# Patient Record
Sex: Female | Born: 1935 | Race: White | Hispanic: No | Smoking: Never smoker
Health system: Southern US, Community
[De-identification: ages and names within clinical notes are randomized; demographics above are authoritative.]

## PROBLEM LIST (undated history)

## (undated) DIAGNOSIS — Z87442 Personal history of urinary calculi: Secondary | ICD-10-CM

## (undated) DIAGNOSIS — R112 Nausea with vomiting, unspecified: Secondary | ICD-10-CM

## (undated) DIAGNOSIS — C801 Malignant (primary) neoplasm, unspecified: Secondary | ICD-10-CM

## (undated) DIAGNOSIS — I10 Essential (primary) hypertension: Secondary | ICD-10-CM

## (undated) DIAGNOSIS — Z9889 Other specified postprocedural states: Secondary | ICD-10-CM

## (undated) DIAGNOSIS — T8859XA Other complications of anesthesia, initial encounter: Secondary | ICD-10-CM

## (undated) DIAGNOSIS — M1990 Unspecified osteoarthritis, unspecified site: Secondary | ICD-10-CM

## (undated) HISTORY — PX: EYE SURGERY: SHX253

## (undated) HISTORY — PX: JOINT REPLACEMENT: SHX530

---

## 2001-08-31 ENCOUNTER — Other Ambulatory Visit: Admission: RE | Admit: 2001-08-31 | Discharge: 2001-08-31 | Payer: Self-pay | Admitting: Internal Medicine

## 2003-02-05 ENCOUNTER — Other Ambulatory Visit: Admission: RE | Admit: 2003-02-05 | Discharge: 2003-02-05 | Payer: Self-pay | Admitting: Internal Medicine

## 2004-01-28 ENCOUNTER — Ambulatory Visit: Payer: Self-pay | Admitting: Internal Medicine

## 2004-02-04 ENCOUNTER — Ambulatory Visit: Payer: Self-pay | Admitting: Internal Medicine

## 2004-06-15 ENCOUNTER — Ambulatory Visit: Payer: Self-pay | Admitting: Internal Medicine

## 2004-10-06 ENCOUNTER — Ambulatory Visit: Payer: Self-pay | Admitting: Internal Medicine

## 2004-12-23 ENCOUNTER — Ambulatory Visit: Payer: Self-pay | Admitting: Internal Medicine

## 2004-12-29 ENCOUNTER — Inpatient Hospital Stay (HOSPITAL_COMMUNITY): Admission: RE | Admit: 2004-12-29 | Discharge: 2005-01-01 | Payer: Self-pay | Admitting: Orthopedic Surgery

## 2005-01-27 ENCOUNTER — Ambulatory Visit: Payer: Self-pay | Admitting: Internal Medicine

## 2005-02-09 ENCOUNTER — Ambulatory Visit: Payer: Self-pay | Admitting: Internal Medicine

## 2007-08-27 ENCOUNTER — Emergency Department (HOSPITAL_COMMUNITY): Admission: EM | Admit: 2007-08-27 | Discharge: 2007-08-27 | Payer: Self-pay | Admitting: Family Medicine

## 2008-12-12 ENCOUNTER — Encounter: Payer: Self-pay | Admitting: Internal Medicine

## 2009-06-11 ENCOUNTER — Ambulatory Visit: Payer: Self-pay | Admitting: Internal Medicine

## 2009-06-11 DIAGNOSIS — M25519 Pain in unspecified shoulder: Secondary | ICD-10-CM | POA: Insufficient documentation

## 2009-06-11 DIAGNOSIS — R0789 Other chest pain: Secondary | ICD-10-CM | POA: Insufficient documentation

## 2009-06-11 DIAGNOSIS — G43909 Migraine, unspecified, not intractable, without status migrainosus: Secondary | ICD-10-CM | POA: Insufficient documentation

## 2009-06-11 DIAGNOSIS — I10 Essential (primary) hypertension: Secondary | ICD-10-CM | POA: Insufficient documentation

## 2009-06-11 DIAGNOSIS — M129 Arthropathy, unspecified: Secondary | ICD-10-CM | POA: Insufficient documentation

## 2009-06-11 DIAGNOSIS — E785 Hyperlipidemia, unspecified: Secondary | ICD-10-CM | POA: Insufficient documentation

## 2009-06-13 DIAGNOSIS — R51 Headache: Secondary | ICD-10-CM | POA: Insufficient documentation

## 2009-06-13 DIAGNOSIS — M1990 Unspecified osteoarthritis, unspecified site: Secondary | ICD-10-CM | POA: Insufficient documentation

## 2009-06-13 DIAGNOSIS — R519 Headache, unspecified: Secondary | ICD-10-CM | POA: Insufficient documentation

## 2009-06-13 DIAGNOSIS — Z87442 Personal history of urinary calculi: Secondary | ICD-10-CM | POA: Insufficient documentation

## 2009-07-03 ENCOUNTER — Ambulatory Visit: Payer: Self-pay | Admitting: Internal Medicine

## 2009-07-03 LAB — CONVERTED CEMR LAB
ALT: 14 units/L (ref 0–35)
AST: 23 units/L (ref 0–37)
Albumin: 4 g/dL (ref 3.5–5.2)
Alkaline Phosphatase: 54 units/L (ref 39–117)
BUN: 33 mg/dL — ABNORMAL HIGH (ref 6–23)
Bilirubin, Direct: 0.1 mg/dL (ref 0.0–0.3)
CO2: 28 meq/L (ref 19–32)
Calcium: 9.3 mg/dL (ref 8.4–10.5)
Chloride: 105 meq/L (ref 96–112)
Cholesterol: 329 mg/dL — ABNORMAL HIGH (ref 0–200)
Creatinine, Ser: 0.9 mg/dL (ref 0.4–1.2)
Direct LDL: 238.1 mg/dL
GFR calc non Af Amer: 62.19 mL/min (ref >60)
Glucose, Bld: 96 mg/dL (ref 70–99)
HDL: 52.7 mg/dL (ref >39.00)
Potassium: 5 meq/L (ref 3.5–5.1)
Sodium: 143 meq/L (ref 135–145)
TSH: 2.19 microintl units/mL (ref 0.35–5.50)
Total Bilirubin: 0.8 mg/dL (ref 0.3–1.2)
Total CHOL/HDL Ratio: 6
Total Protein: 6.9 g/dL (ref 6.0–8.3)
Triglycerides: 159 mg/dL — ABNORMAL HIGH (ref 0.0–149.0)
VLDL: 31.8 mg/dL (ref 0.0–40.0)

## 2009-07-10 ENCOUNTER — Ambulatory Visit: Payer: Self-pay | Admitting: Internal Medicine

## 2009-08-15 ENCOUNTER — Ambulatory Visit: Payer: Self-pay | Admitting: Internal Medicine

## 2009-08-21 LAB — CONVERTED CEMR LAB
BUN: 33 mg/dL — ABNORMAL HIGH (ref 6–23)
CO2: 30 meq/L (ref 19–32)
Calcium: 9.5 mg/dL (ref 8.4–10.5)
Chloride: 106 meq/L (ref 96–112)
Cholesterol: 195 mg/dL (ref 0–200)
Creatinine, Ser: 1 mg/dL (ref 0.4–1.2)
GFR calc non Af Amer: 60.68 mL/min (ref >60)
Glucose, Bld: 106 mg/dL — ABNORMAL HIGH (ref 70–99)
HDL: 54.5 mg/dL (ref >39.00)
LDL Cholesterol: 120 mg/dL — ABNORMAL HIGH (ref 0–99)
Potassium: 4.4 meq/L (ref 3.5–5.1)
Sodium: 145 meq/L (ref 135–145)
Total CHOL/HDL Ratio: 4
Triglycerides: 104 mg/dL (ref 0.0–149.0)
VLDL: 20.8 mg/dL (ref 0.0–40.0)

## 2009-08-22 ENCOUNTER — Ambulatory Visit: Payer: Self-pay | Admitting: Internal Medicine

## 2009-08-22 DIAGNOSIS — R7309 Other abnormal glucose: Secondary | ICD-10-CM | POA: Insufficient documentation

## 2009-11-19 ENCOUNTER — Ambulatory Visit: Payer: Self-pay | Admitting: Internal Medicine

## 2009-12-22 ENCOUNTER — Ambulatory Visit: Payer: Self-pay | Admitting: Internal Medicine

## 2009-12-22 LAB — CONVERTED CEMR LAB
ALT: 16 units/L (ref 0–35)
AST: 27 units/L (ref 0–37)
Albumin: 3.8 g/dL (ref 3.5–5.2)
Alkaline Phosphatase: 53 units/L (ref 39–117)
Bilirubin, Direct: 0.1 mg/dL (ref 0.0–0.3)
Cholesterol: 216 mg/dL — ABNORMAL HIGH (ref 0–200)
Direct LDL: 146 mg/dL
HDL: 50.9 mg/dL (ref >39.00)
Hgb A1c MFr Bld: 6.5 % (ref 4.6–6.5)
Total Bilirubin: 1.1 mg/dL (ref 0.3–1.2)
Total CHOL/HDL Ratio: 4
Total Protein: 6.6 g/dL (ref 6.0–8.3)
Triglycerides: 113 mg/dL (ref 0.0–149.0)
VLDL: 22.6 mg/dL (ref 0.0–40.0)

## 2009-12-25 ENCOUNTER — Ambulatory Visit: Payer: Self-pay | Admitting: Internal Medicine

## 2010-03-22 LAB — CONVERTED CEMR LAB: Pap Smear: NORMAL

## 2010-03-24 NOTE — Assessment & Plan Note (Signed)
Summary: 1 mo rov/mm   Vital Signs:  Patient profile:   75 year old female Menstrual status:  postmenopausal Weight:      165 pounds BP sitting:   130 / 90  (right arm) CC: FU labs   History of Present Illness: [name] comes in today  with husband for follow up of labs  HT and lipids .  See past  hx of se of  simvastatin and stopped .  off for about 2-3 weeks.    "the only time better " was when  pure    vegetarian eating and was hungry all the time.  Willing to try again.    Preventive Screening-Counseling & Management  Alcohol-Tobacco     Alcohol drinks/day: 0     Smoking Status: never     Passive Smoke Exposure: no  Caffeine-Diet-Exercise     Caffeine use/day: 3     Does Patient Exercise: no  Current Medications (verified): 1)  Zestoretic 10-12.5 Mg Tabs (Lisinopril-Hydrochlorothiazide) .Marland Kitchen.. 1 By Mouth Once Daily  Allergies (verified): 1)  ! Sulfa 2)  ! * [place]ipero  Past History:  Past medical, surgical, family and social histories (including risk factors) reviewed for relevance to current acute and chronic problems.  Past Medical History: Reviewed history from 06/11/2009 and no changes required. G4P4 Hyperlipidemia Hypertension Osteoarthritis Varicella Nephrolithiasis, hx of Headache migraines when younger  Past Surgical History: Reviewed history from 06/11/2009 and no changes required. Knee Replacement-12/29/2004; 02/14/2007  Family History: Reviewed history from 06/11/2009 and no changes required. Father: DM, Stroke, alcholicdied early 22s  Mother: Heart problems, alcoholic  died early 31s  Siblings:  Daughter with DM   Social History: Reviewed history from 06/11/2009 and no changes required. Occupation: [employer] Married Never Smoked Alcohol use-no Drug use-no Regular exercise-no Hh of 3  3 dogs.   Physical Exam  General:  Well-developed,well-nourished,in no acute distress; alert,appropriate and cooperative  Psych:   Oriented X3, good eye contact, not anxious appearing, and not depressed appearing.   reviewed labs       Impression & Recommendations:  Problem # 1:  HYPERLIPIDEMIA (ICD-272.4) s/e of higher dose of meds  and simvastatin wasnt effective . had been on lipitor  in past and ? se on higher dose.  aware of lifestyle intervention .  The following medications were removed from the medication list:    Simvastatin 40 Mg Tabs (Simvastatin) .Marland Kitchen... 1 by mouth once daily Her updated medication list for this problem includes:    Crestor 10 Mg Tabs (Rosuvastatin calcium) .Marland Kitchen... 1 by mouth once daily  Labs Reviewed: SGOT: 23 (07/03/2009)   SGPT: 14 (07/03/2009)   HDL:52.70 (07/03/2009)  Chol:329 (07/03/2009)  Trig:159.0 (07/03/2009)  Problem # 2:  HYPERTENSION (ICD-401.9)  needs refill of meds  Her updated medication list for this problem includes:    Zestoretic 10-12.5 Mg Tabs (Lisinopril-hydrochlorothiazide) .Marland Kitchen... 1 by mouth once daily  BP today: 130/90 Prior BP: 130/80 (06/11/2009)  Labs Reviewed: K+: 5.0 (07/03/2009) Creat: : 0.9 (07/03/2009)   Chol: 329 (07/03/2009)   HDL: 52.70 (07/03/2009)   TG: 159.0 (07/03/2009)  Orders: Prescription Created Electronically [phone])  Complete Medication List: 1)  Zestoretic 10-12.5 Mg Tabs (Lisinopril-hydrochlorothiazide) .Marland Kitchen.. 1 by mouth once daily 2)  Crestor 10 Mg Tabs (Rosuvastatin calcium) .Marland Kitchen.. 1 by mouth once daily  Patient Instructions: 1)  take crestor every day.  2)  Hepatic Panel prior to visit ICD-9:  3)  Lipid panel prior to visit ICD-9 :  4)  ROV in 6-7 weeks or as needed. Prescriptions: ZESTORETIC 10-12.5 MG TABS (LISINOPRIL-HYDROCHLOROTHIAZIDE) 1 by mouth once daily  #90 x 3   Entered and Authorized by:   Madelin Headings MD   Signed by:   Madelin Headings MD on 07/10/2009   Method used:   Electronically to        [facility]  [address] (retail)       [address]       [place], [place]  [address]       Ph:  [phone]       Fax: [phone]   RxID:   [id number]

## 2010-03-24 NOTE — Assessment & Plan Note (Signed)
Summary: new to est- ok per shannon//ccm   Vital Signs:  Patient profile:   75 year old female Menstrual status:  postmenopausal Height:      61.75 inches Weight:      163 pounds BMI:     30.16 Pulse rate:   66 / minute BP sitting:   130 / 80  (right arm) Cuff size:   regular  Vitals Entered By: Romualdo Bolk, CMA (AAMA) (June 11, 2009 10:58 AM) CC: New Pt to get re-establish     Menstrual Status postmenopausal Last PAP Result normal   History of Present Illness: [name] comesin with husband  to reestablish  after moving back from [place] where they have been a number of years   . Sh has a number of medical issues   .   Brings most recent medical records.   She has HT on medication without se. SHe has been rx for hyperlipidemia   zocor recnelty and Went up on lipid med 2 months  ago. from ? 20 to 40 mg  and began to notice  a tight ness  in her chest like elephants sitting on her  at night that  can wake her up.   No exercise induce signs or limitation  .    no gerd  acid noted and no nausea vomiting  cough..  no exercise   indiced signs no asa. no coughing and wheezing.      Also has been dealing with  left shoulder nec arm pain   .  Has had therapy for left arm pain   for bursitis with  RX  with PT.  x 2 months  3 x per week   muscle spasms.  and still bothering her        cortisone shot no helps.   hurts when moves at times.    harder to raise arm . NO numbness or weakness.   Preventive Care Screening  Mammogram:    Date:  03/25/2009    Results:  normal   Pap Smear:    Date:  03/25/2009    Results:  normal   Last Tetanus Booster:    Date:  02/23/2008    Results:  Historical    Preventive Screening-Counseling & Management  Alcohol-Tobacco     Alcohol drinks/day: 0     Smoking Status: never     Passive Smoke Exposure: no  Caffeine-Diet-Exercise     Caffeine use/day: 3     Does Patient Exercise: no  Safety-Violence-Falls     Seat Belt Use: yes  Firearms in the Home: no firearms in the home     Smoke Detectors: yes      Drug Use:  no.    Current Medications (verified): 1)  Zestoretic 10-12.5 Mg Tabs (Lisinopril-Hydrochlorothiazide) .Marland Kitchen.. 1 By Mouth Once Daily 2)  Simvastatin 40 Mg Tabs (Simvastatin) .Marland Kitchen.. 1 By Mouth Once Daily  Allergies (verified): No Known Drug Allergies  Past History:  Past Medical History: G4P4 Hyperlipidemia Hypertension Osteoarthritis Varicella Nephrolithiasis, hx of Headache migraines when younger  Past Surgical History: Knee Replacement-12/29/2004; 02/14/2007  Past History:  Care Management: None Current  Family History: Father: DM, Stroke, alcholicdied early 24s  Mother: Heart problems, alcoholic  died early 30s  Siblings:  Daughter with DM   Social History: Occupation: [employer] Married Never Smoked Alcohol use-no Drug use-no Regular exercise-no Hh of 3  3 dogs.  Smoking Status:  never Caffeine use/day:  3 Does Patient Exercise:  no Occupation:  employed Drug Use:  no Passive Smoke Exposure:  no Risk analyst Use:  yes  Review of Systems  The patient denies anorexia, fever, weight loss, weight gain, vision loss, decreased hearing, hoarseness, syncope, dyspnea on exertion, peripheral edema, prolonged cough, abdominal pain, melena, hematochezia, severe indigestion/heartburn, hematuria, muscle weakness, suspicious skin lesions, transient blindness, difficulty walking, depression, unusual weight change, abnormal bleeding, enlarged lymph nodes, and angioedema.    Physical Exam  General:  Well-developed,well-nourished,in no acute distress; alert,appropriate and cooperative throughout examination. appears younger than stated age.  Head:  Normocephalic and atraumatic without obvious abnormalities. No apparent alopecia or balding. Eyes:  vision grossly intact.  glasses  Ears:  R ear normal and L ear normal.   Mouth:  pharynx pink and moist.   Neck:  No deformities,  masses, or tenderness noted. Chest Wall:  no deformities and no tenderness.   Lungs:  Normal respiratory effort, chest expands symmetrically. Lungs are clear to auscultation, no crackles or wheezes.no dullness.   Heart:  Normal rate and regular rhythm. S1 and S2 normal without gallop, murmur, click, rub or other extra sounds.no lifts.   Abdomen:  Bowel sounds positive,abdomen soft and non-tender without masses, organomegaly or  noted. Msk:  no joint swelling and no redness over joints.   some LOM left shoulder internal and external rotation Pulses:  pulses intact without delay   Extremities:  no clubbing cyanosis or edema  Neurologic:  non focal  alert & oriented X3, gait normal, and DTRs symmetrical and normal.   Skin:  turgor normal, color normal, no ecchymoses, and no petechiae.   Cervical Nodes:  No lymphadenopathy noted Psych:  Oriented X3, good eye contact, not anxious appearing, and not depressed appearing.   Additional Exam:  reviewed records  last lipids in October were 268 ld 173 ratio 5.8 . lfts and bmp nl. EKG NSR  nl intervals  rate 65  Impression & Recommendations:  Problem # 1:  CHEST DISCOMFORT (ICD-786.59)  pt  sees this as related to increase dose of simvastatin  and getss it at night  . takes med before sleep .but denies dyshpagia or   reflux.    Orders: EKG w/ Interpretation ([address])  Problem # 2:  HYPERLIPIDEMIA (ICD-272.4)  hx of same risk factors are age family hx and ht .  Her updated medication list for this problem includes:    Simvastatin 40 Mg Tabs (Simvastatin) .Marland Kitchen... 1 by mouth once daily  Orders: EKG w/ Interpretation ([address])  Problem # 3:  HYPERTENSION (ICD-401.9)  Her updated medication list for this problem includes:    Zestoretic 10-12.5 Mg Tabs (Lisinopril-hydrochlorothiazide) .Marland Kitchen... 1 by mouth once daily  BP today: 130/80  Orders: EKG w/ Interpretation ([address])  Problem # 4:  SHOULDER PAIN, LEFT (ICD-719.41) sounds like bursitis arthritis  and not related to above chest tightness   disc  referral when appropriate ? if reeval will help  Complete Medication List: 1)  Zestoretic 10-12.5 Mg Tabs (Lisinopril-hydrochlorothiazide) .Marland Kitchen.. 1 by mouth once daily 2)  Simvastatin 40 Mg Tabs (Simvastatin) .Marland Kitchen.. 1 by mouth once daily  Patient Instructions: 1)  stop the simvastatin for now .Marland Kitchen   2)  take  prislosec once daily for  2 weeks  . 3)  then rov and will decide on labs and testing . If pain is persistent or  progressive  may need to do a stress test. if never done.

## 2010-03-24 NOTE — Assessment & Plan Note (Signed)
Summary: 4 month fup//ccm   Vital Signs:  Patient profile:   75 year old female Menstrual status:  postmenopausal Weight:      164 pounds BMI:     30.35 Pulse rate:   99 / minute BP sitting:   120 / 80  (right arm) Cuff size:   regular  Vitals Entered By: Romualdo Bolk, CMA (AAMA) (December 25, 2009 8:15 AM)  Nutrition Counseling: Patient's BMI is greater than 25 and therefore counseled on weight management options. CC: Follow-up visit on labs, Hypertension Management, General Medical Evaluation   History of Present Illness: [name] comes in today  for  follow up of multiple medical problems   HT taking medication without side effect.   Her readings are normal. LIPIDS:  she's taking medication without obvious side effect she does eat a lot of sweets. No chest pain shortness of breath Hyperglycemia :   does tend to eat some sweets. Still working at [employer] when possible.   Hypertension History:      She complains of peripheral edema, but denies headache, chest pain, palpitations, dyspnea with exertion, orthopnea, PND, visual symptoms, neurologic problems, syncope, and side effects from treatment.  She notes no problems with any antihypertensive medication side effects.  ankles due to standing .        Positive major cardiovascular risk factors include female age 79 years old or older, hyperlipidemia, and hypertension.  Negative major cardiovascular risk factors include non-tobacco-user status.      Preventive Screening-Counseling & Management  Alcohol-Tobacco     Alcohol drinks/day: 0     Smoking Status: never     Passive Smoke Exposure: no  Caffeine-Diet-Exercise     Caffeine use/day: 3     Does Patient Exercise: no  Current Medications (verified): 1)  Zestoretic 10-12.5 Mg Tabs (Lisinopril-Hydrochlorothiazide) .Marland Kitchen.. 1 By Mouth Once Daily 2)  Crestor 10 Mg Tabs (Rosuvastatin Calcium) .Marland Kitchen.. 1 By Mouth Once Daily  Allergies (verified): 1)  ! Sulfa 2)  ! *  Cipero  Past History:  Past medical, surgical, family and social histories (including risk factors) reviewed, and no changes noted (except as noted below).  Past Medical History: Reviewed history from 06/11/2009 and no changes required. G4P4 Hyperlipidemia Hypertension Osteoarthritis Varicella Nephrolithiasis, hx of Headache migraines when younger  Past Surgical History: Reviewed history from 06/11/2009 and no changes required. Knee Replacement-12/29/2004; 02/14/2007  Past History:  Care Management: None Current  Family History: Reviewed history from 06/11/2009 and no changes required. Father: DM, Stroke, alcholicdied early 31s  Mother: Heart problems, alcoholic  died early 14s  Siblings:  Daughter with DM   Social History: Reviewed history from 06/11/2009 and no changes required. Occupation: [employer] Married Never Smoked Alcohol use-no Drug use-no Regular exercise-no Hh of 3  3 dogs.   Review of Systems  The patient denies anorexia, fever, chest pain, dyspnea on exertion, peripheral edema, prolonged cough, abnormal bleeding, enlarged lymph nodes, and angioedema.         rest of 12 sytem review not  contributory or changed  Physical Exam  General:  Well-developed,well-nourished,in no acute distress; alert,appropriate and cooperative throughout examination Head:  normocephalic and atraumatic.   Eyes:  vision grossly intact.   Neck:  No deformities, masses, or tenderness noted. Lungs:  Normal respiratory effort, chest expands symmetrically. Lungs are clear to auscultation, no crackles or wheezes. Heart:  Normal rate and regular rhythm. S1 and S2 normal without gallop, murmur, click, rub or other extra sounds.  Pulses:  pulses intact without delay   Neurologic:  Pt is A&Ox3,affect,speech,memory,attention,&motor skills appear intact. gait normal.   Skin:  turgor normal, color normal, no ecchymoses, and no petechiae.   Cervical Nodes:  No  lymphadenopathy noted Psych:  Normal eye contact, appropriate affect. Cognition appears normal.    Impression & Recommendations:  Problem # 1:  HYPERGLYCEMIA, FASTING (ICD-790.29) counseled borderline high hemoglobin A-1 C6 .5.  Counseled in this regard. At risk for developing diabetes. Labs Reviewed: Creat: 1.0 (08/15/2009)     Problem # 2:  HYPERLIPIDEMIA (ICD-272.4) could be better but much better thanoriginal total cholesterol of 300. In lieu of increasing medication would prefer lifestyle interventions Her updated medication list for this problem includes:    Crestor 10 Mg Tabs (Rosuvastatin calcium) .Marland Kitchen... 1 by mouth once daily  Labs Reviewed: SGOT: 27 (12/22/2009)   SGPT: 16 (12/22/2009)  10 Yr Risk Heart Disease: 9 % Prior 10 Yr Risk Heart Disease: 6 % (08/22/2009)   HDL:50.90 (12/22/2009), 54.50 (08/15/2009)  LDL:120 (08/15/2009)  Chol:216 (12/22/2009), 195 (08/15/2009)  Trig:113.0 (12/22/2009), 104.0 (08/15/2009)  Problem # 3:  HYPERTENSION (ICD-401.9)  Her updated medication list for this problem includes:    Zestoretic 10-12.5 Mg Tabs (Lisinopril-hydrochlorothiazide) .Marland Kitchen... 1 by mouth once daily  BP today: 120/80 Prior BP: 100/70 (08/22/2009)  10 Yr Risk Heart Disease: 9 % Prior 10 Yr Risk Heart Disease: 6 % (08/22/2009)  Labs Reviewed: K+: 4.4 (08/15/2009) Creat: : 1.0 (08/15/2009)   Chol: 216 (12/22/2009)   HDL: 50.90 (12/22/2009)   LDL: 120 (08/15/2009)   TG: 113.0 (12/22/2009)  Complete Medication List: 1)  Zestoretic 10-12.5 Mg Tabs (Lisinopril-hydrochlorothiazide) .Marland Kitchen.. 1 by mouth once daily 2)  Crestor 10 Mg Tabs (Rosuvastatin calcium) .Marland Kitchen.. 1 by mouth once daily  Hypertension Assessment/Plan:      The patient's hypertensive risk group is category B: At least one risk factor (excluding diabetes) with no target organ damage.  Her calculated 10 year risk of coronary heart disease is 9 %.  Today's blood pressure is 120/80.  Her blood pressure goal is <  140/90.  Patient Instructions: 1)  avoid  sugars  and  sweets   . 2)  wellness visit in April 2012 .   PAP then    Orders Added: 1)  Est. Patient Level IV [[address]]    greater than 50% of visit spent in counseling   25 minutes  .

## 2010-03-24 NOTE — Assessment & Plan Note (Signed)
Summary: 6 WKS ROV/MM pt rsc/njr   Vital Signs:  Patient profile:   75 year old female Menstrual status:  postmenopausal Weight:      162 pounds Pulse rate:   60 / minute BP sitting:   100 / 70  (right arm) Cuff size:   regular  Vitals Entered By: Romualdo Bolk, CMA (AAMA) (August 22, 2009 8:13 AM) CC: Follow-up visit on labs, Hypertension Management   History of Present Illness: [name] comes in today  for follow up of labs after starting lipid medication. Crestor 10 mg per day..   no known se  BP still doing well.  No new signs .   Likes to eat ice cream at night .    Hypertension History:      She denies headache, chest pain, palpitations, dyspnea with exertion, orthopnea, PND, peripheral edema, visual symptoms, neurologic problems, syncope, and side effects from treatment.  She notes no problems with any antihypertensive medication side effects.        Positive major cardiovascular risk factors include female age 73 years old or older, hyperlipidemia, and hypertension.  Negative major cardiovascular risk factors include non-tobacco-user status.     Preventive Screening-Counseling & Management  Alcohol-Tobacco     Alcohol drinks/day: 0     Smoking Status: never     Passive Smoke Exposure: no  Caffeine-Diet-Exercise     Caffeine use/day: 3     Does Patient Exercise: no  Current Medications (verified): 1)  Zestoretic 10-12.5 Mg Tabs (Lisinopril-Hydrochlorothiazide) .Marland Kitchen.. 1 By Mouth Once Daily 2)  Crestor 10 Mg Tabs (Rosuvastatin Calcium) .Marland Kitchen.. 1 By Mouth Once Daily  Allergies (verified): 1)  ! Sulfa 2)  ! * [place]o  Past History:  Past medical, surgical, family and social histories (including risk factors) reviewed for relevance to current acute and chronic problems.  Past Medical History: Reviewed history from 06/11/2009 and no changes required. G4P4 Hyperlipidemia Hypertension Osteoarthritis Varicella Nephrolithiasis, hx of Headache migraines when  younger  Past Surgical History: Reviewed history from 06/11/2009 and no changes required. Knee Replacement-12/29/2004; 02/14/2007  Past History:  Care Management: None Current  Family History: Reviewed history from 06/11/2009 and no changes required. Father: DM, Stroke, alcholicdied early 68s  Mother: Heart problems, alcoholic  died early 55s  Siblings:  Daughter with DM   Social History: Reviewed history from 06/11/2009 and no changes required. Occupation: [employer] Married Never Smoked Alcohol use-no Drug use-no Regular exercise-no Hh of 3  3 dogs.   Review of Systems       no myalgias  fever inc joint pains  Physical Exam  General:  Well-developed,well-nourished,in no acute distress; alert,appropriate and cooperative throughout examination Heart:  normal rate and regular rhythm.     Impression & Recommendations:  Problem # 1:  HYPERLIPIDEMIA (ICD-272.4) doing much better   liver test didnt get done in error but  can wait  to check at next labs as has no signs  Her updated medication list for this problem includes:    Crestor 10 Mg Tabs (Rosuvastatin calcium) .Marland Kitchen... 1 by mouth once daily  Problem # 2:  HYPERTENSION (ICD-401.9)  Her updated medication list for this problem includes:    Zestoretic 10-12.5 Mg Tabs (Lisinopril-hydrochlorothiazide) .Marland Kitchen... 1 by mouth once daily  BP today: 100/70 Prior BP: 130/90 (07/10/2009)  10 Yr Risk Heart Disease: 6 %  Labs Reviewed: K+: 4.4 (08/15/2009) Creat: : 1.0 (08/15/2009)   Chol: 195 (08/15/2009)   HDL: 54.50 (08/15/2009)  LDL: 120 (08/15/2009)   TG: 104.0 (08/15/2009)  Problem # 3:  HYPERGLYCEMIA, FASTING (ICD-790.29)  family hx    of dm counseled about  lifestyle intervention    fbs is 106  Labs Reviewed: Creat: 1.0 (08/15/2009)     Complete Medication List: 1)  Zestoretic 10-12.5 Mg Tabs (Lisinopril-hydrochlorothiazide) .Marland Kitchen.. 1 by mouth once daily 2)  Crestor 10 Mg Tabs (Rosuvastatin calcium)  .Marland Kitchen.. 1 by mouth once daily  Hypertension Assessment/Plan:      The patient's hypertensive risk group is category B: At least one risk factor (excluding diabetes) with no target organ damage.  Her calculated 10 year risk of coronary heart disease is 6 %.  Today's blood pressure is 100/70.    Patient Instructions: 1)  continue crestor   as it is working well. samples given. 2)  Hepatic Panel prior to visit ICD-9:  272.4  3)  Lipid panel prior to visit ICD-9 :  4)  HgBA1c prior to visit  ICD-9:  hyperglycemia  5)  then ROV.   Prescriptions: CRESTOR 10 MG TABS (ROSUVASTATIN CALCIUM) 1 by mouth once daily  #30 x 6   Entered and Authorized by:   Madelin Headings MD   Signed by:   Madelin Headings MD on 08/22/2009   Method used:   Print then Give to Patient   RxID:   [id number]

## 2010-03-24 NOTE — Letter (Signed)
Summary: [facility]   Imported By: Maryln Gottron 06/17/2009 12:22:13  _____________________________________________________________________  External Attachment:    Type:   Image     Comment:   External Document

## 2010-03-24 NOTE — Assessment & Plan Note (Signed)
Summary: FLU SHOT/CB   Nurse Visit   Allergies: 1)  ! Sulfa 2)  ! * [place]o  Appended Document: Orders Update   Flu Vaccine Consent Questions     Do you have a history of severe allergic reactions to this vaccine? no    Any prior history of allergic reactions to egg and/or gelatin? no    Do you have a sensitivity to the preservative Thimersol? no    Do you have a past history of Guillan-Barre Syndrome? no    Do you currently have an acute febrile illness? no    Have you ever had a severe reaction to latex? no    Vaccine information given and explained to patient? yes    Are you currently pregnant? no    Lot Number:[id number]   Exp Date:08/22/2010   Site Given  Left Deltoid IM   Clinical Lists Changes  Orders: Added new Service order of Flu Vaccine 39yrs + MEDICARE PATIENTS ([id number]) - Signed Added new Service order of Administration Flu vaccine - MCR ([id number]) - Signed Observations: Added new observation of FLU VAX VIS: 09/16/2009 version (12/09/2009 17:05) Added new observation of FLU VAXLOT: [id number] (12/09/2009 17:05) Added new observation of FLU VAXMFR: [employer] (12/09/2009 17:05) Added new observation of FLU VAX EXP: 08/22/2010 (12/09/2009 17:05) Added new observation of FLU VAX DSE: 0.23ml (12/09/2009 17:05) Added new observation of FLU VAX: Fluvax 3+ (12/09/2009 17:05)

## 2010-06-10 ENCOUNTER — Other Ambulatory Visit: Payer: Self-pay

## 2010-07-10 NOTE — H&P (Signed)
Harper, Diane               ACCOUNT NO.:  [id number]   MEDICAL RECORD NO.:  [id number]          PATIENT TYPE:  INP   LOCATION:  NA                           FACILITY:  [facility]   PHYSICIAN:  Madlyn Frankel. Charlann Boxer, M.D.  DATE OF BIRTH:  23-Apr-1935   DATE OF ADMISSION:  12/29/2004  DATE OF DISCHARGE:                                HISTORY & PHYSICAL   PRINCIPAL DIAGNOSIS:  Left knee end-stage osteoarthritis.   SECONDARY DIAGNOSES:  1.  Hypertension.  2.  Hypercholesterolemia.  3.  History of migraine headaches.  4.  Kidney stones.  5.  Uterine fibroids.   HISTORY OF PRESENT ILLNESS:  [name] is a very pleasant 75 year old  female who presented to the office early this year with left knee pain. She  had had progressive discomfort that failed to have any response to  conservative measures including cortisone injection, activity modification  and antiinflammatories. After reviewing with her the current situation, she  wished to proceed with surgical intervention. She does have some concerns  and nervousness about it but understands that her pain is not getting any  better with conservative measures.   CURRENT MEDICATIONS:  Benicar, Zetia, Lipitor, aspirin.   DRUG ALLERGIES:  CIPRO, SULFA, MEVACOR.   PAST MEDICAL HISTORY:  1.  Hypertension.  2.  Hypercholesterolemia.  3.  Migraine headaches.  4.  Kidney stones.  5.  Uterine fibroids.   PAST SURGICAL HISTORY:  Negative.   FAMILY HISTORY:  Diabetes, breast cancer, hypertension, coronary artery  disease, stroke.   REVIEW OF SYMPTOMS:  Otherwise normal. No upper respiratory, pulmonary,  cardiac, gastrointestinal or genitourinary issues other than that in the  HPI.   SOCIAL HISTORY:  She is married and presents today with her daughter and  husband. She currently works at [employer] and is getting ready to move to  [place] in the spring time. Despite this, she wishes to have her knee taken  care of here.   PHYSICAL  EXAMINATION:  GENERAL:  [name] is an otherwise healthy  appearing 75 year old female.  VITAL SIGNS:  Her temperature today is 98.4, pulse 72, blood pressure  144/84.  HEAD/NECK:  Reveals no evidence of any asymmetry, no slurred speech, normal  eye movement. Her neck is supple with no nodes palpable. She has no bruits  detected on auscultation.  CHEST:  Exam is clear to auscultation bilaterally, no rales or rhonchi.  HEART:  Regular rate and rhythm, no murmurs.  ABDOMEN:  Soft and nontender with positive bowel sounds.  EXTREMITIES:  Reveal a left knee with full knee extension and flexion to 120  degrees with stable ligaments, painful crepitation and minor swelling noted.  She otherwise is neurovascularly intact.  SKIN:  Intact with no cellulitis.   X-rays of the left knee, osteoarthritis, end-stage tricompartmental.   LABS:  Pending.   IMPRESSION:  End-stage left knee osteoarthritis.   PLAN:  The patient will be admitted for same day surgery on December 29, 2004  for a left total knee replacement. The risks and benefits and all questions  were encouraged, answered and reviewed today  in the office. Consent will be  obtained based off this discussion.      Madlyn Frankel Charlann Boxer, M.D.  Electronically Signed     MDO/MEDQ  D:  12/23/2004  T:  12/23/2004  Job:  [id number]

## 2010-07-10 NOTE — Discharge Summary (Signed)
[name], [name]               ACCOUNT NO.:  [id number]   MEDICAL RECORD NO.:  [id number]          PATIENT TYPE:  INP   LOCATION:  1521                         FACILITY:  [facility]   PHYSICIAN:  Madlyn Frankel. Charlann Boxer, M.D.  DATE OF BIRTH:  October 05, 1935   DATE OF ADMISSION:  12/29/2004  DATE OF DISCHARGE:  01/01/2005                                 DISCHARGE SUMMARY   ADMISSION DIAGNOSES:  1.  Left knee end-stage osteoarthritis.  2.  Hypercholesterolemia.  3.  Hypertension.  4.  History of migraine headaches.  5.  History of renal calculi and uterine fibroids.   DISCHARGE DIAGNOSES:  1.  Left knee end-stage osteoarthritis.  2.  Hypercholesterolemia.  3.  Hypertension.  4.  History of migraine headaches.  5.  History of renal calculi and uterine fibroids.  6.  Mild postoperative anemia.   OPERATION:  On December 29, 2004, the patient underwent left total knee  replacement arthroplasty, utilizing DePuy rotating platform knee system with  posterior stabilization.  Jenne Campus, PA-C, assisted.   BRIEF HISTORY:  This 75 year old lady was seen in the office with  progressive and severe left knee pain.  She had a trial of conservative  measures including cortisone injections and some anti-inflammatories which  did not provide her with any long-lasting relief.  X-ray has shown severe  degenerative changes into the knee and as she is a very active, relatively  young lady, she was highly desirous to have surgical intervention.  After  the risks and benefits of surgery had been discussed with the patient by Dr.  Charlann Boxer, it was decided to go ahead with the above procedure.   COURSE IN THE HOSPITAL:  The patient tolerated the surgical procedure quite  well.  The patient was placed on Coumadin protocol postoperatively for the  prevention of DVT and continued so throughout her hospitalization.  She was  very active in physical therapy, however, desires to do well with her knee,  as only she would  benefit.  On the first postop day, she could straight leg  raise at least 8 inches off of the bed.  The dressing was changed; the wound  remained dry, neurovascular remained intact to the operative extremity; calf  remained soft.  We arranged for home health and once this was arranged, it  was decided that she could be maintained in her home environment, and she  was discharged home.   No critical incidents occurred during this patient's hospitalization.   LABORATORY VALUES IN THE HOSPITAL:  Hematologically showed a preoperative  CBC completely within normal limits.  Hemoglobin was 13.8; hematocrit was  40.7.  Final hemoglobin was 11.0 with hematocrit of 32.3.  Blood chemistries  remained normal.  She did have a very slight drop in her potassium from 4.2  to 3.3 at discharge but without any symptomatology.  Urinalysis negative for  urinary tract infection.  Chest x-ray showed no active cardiopulmonary  disease; no EKG on this chart.   CONDITION ON DISCHARGE:  Improved, stable.   PLAN:  The patient is discharged to her home.  She is  to continue with home  health and diet.  She is given Vicodin for discomfort, does not want a  muscle relaxant, and will use a Coumadin protocol for 4 weeks after the date  of surgery with the home health pharmacist directing the Coumadin protocol.  Return to see Dr. Charlann Boxer 2 weeks after the date of surgery.  May continue  weightbearing as tolerated on the operative extremity.  She is urged to call  should she have any problems.      [name].      Madlyn Frankel Charlann Boxer, M.D.  Electronically Signed    DLU/MEDQ  D:  01/18/2005  T:  01/19/2005  Job:  [id number]   cc:   Madlyn Frankel Charlann Boxer, M.D.  Fax: [phone]

## 2010-07-10 NOTE — Op Note (Signed)
[name], [name]               ACCOUNT NO.:  [id number]   MEDICAL RECORD NO.:  [id number]          PATIENT TYPE:  INP   LOCATION:  X004                         FACILITY:  [facility]   PHYSICIAN:  Madlyn Frankel. Charlann Boxer, M.D.  DATE OF BIRTH:  Oct 09, 1935   DATE OF PROCEDURE:  12/29/2004  DATE OF DISCHARGE:                                 OPERATIVE REPORT   PREOPERATIVE DIAGNOSIS:  Left knee end-stage osteoarthritis.   POSTOPERATIVE DIAGNOSIS:  Left knee end-stage osteoarthritis.   PROCEDURE:  Left total knee replacement.   COMPONENTS USED:  DePuy rotating platform knee system, posterior stabilized  with a 2.5 femur, 2.5 tibia, 15 mm polyethylene  liner and a 35 mm patellar  button.   SURGEON:  Madlyn Frankel. Charlann Boxer, M.D.   ASSISTANTDruscilla Brownie. Cherlynn June.   ANESTHESIA:  Spinal plus MAC.   COMPLICATIONS:  None.   DRAINS:  One.   TOURNIQUET TIME:  Sixty-five minutes at 250 mmHg.   INDICATIONS FOR PROCEDURE:  [name] is a very pleasant 75 year old female  who presented to the office recently with progressive left knee pain.  A  brief trial with conservative measures, including cortisone injection,  failed to provide any relief.  Based on her persistent symptoms and lack of  desire to continue trying to attempting to treat this in a conservative  fashion, surgical options were reviewed.  Based on the advanced nature of  her arthritis, total knee replacement was the only option.  Risks and  benefits were reviewed of knee replacement surgery, including persistent  discomfort and stiffness, DVT, infection, component failure.  Consent was  obtained following discussion.   PROCEDURE IN DETAIL:  Patient was brought to the operating theater.  Once  adequate anesthesia and preoperative antibiotics were administered, 1 gm of  Ancef, the patient was positioned supine on the operative table.  Left lower  extremity was then prepped and draped in a sterile fashion over a proximal  thigh  tourniquet.  A midline incision was made, followed by a medial  parapatellar arthrotomy.  The patella was not everted but just subluxated  laterally.  Exposure was obtained, including debridement of osteophyte to  allow for decompression of the joint.  Once exposure was obtained, the femur  was entered, and intramedullary guide passed, and 10 mm of bone was resected  off the distal femur at 5 degrees of valgus.  Following this resection, the  femur was measured in size and was found to be size 2.5.  The 2.5 cutting  block was then positioned.  It was oriented perpendicular to Whiteside's  line as well as perpendicular along the epicondylar axis.  Anterior,  posterior, and chamfer cuts were then made.  At this point attention was  directed to the tibia.  Tibial exposure was obtained in a routine fashion  using an extramedullary device and resected 10 mm of bone off of the lateral  side initially.  Initial trial reduction indicated resection of 2 mm more.  Upon removing these 2 mm, the posterior aspect of the knee was debrided of  the osteophytes, particularly off  of the medial femoral condyle.  Trial  reduction was then carried out with a size 2.5 femur, 2.5 tibia, and a 10  poly.  The knee had some hyperextension.  The patient was noted to have some  hyperextension initially, and this desire for initial conservative cut off  the tibia.   I then trialed a 12.5 or 15 at the final.  Final femoral preparation was  carried out with a box cut based off the lateral aspect of the medial  femoral condyle.  Once trial components were then positioned, the patellar  preparation was carried out.  Precut patellar measurement was approximately  20 mm, resected only about 5 mm, and then placed with a 35 patella button.  The patella tracked without application of pressure.  Final preparation of  the tibia was carried out with a marked rotation from the fixed bearing  trial.  Final components were brought  into the field.  Note at this point,  following irrigation, the knee was injected with 60 cc of 0.5% Marcaine with  epinephrine as well as 1 cc of 30 mg of Toradol.  Following this, the cement  was prepared.  The tibia, femur, and patella were all cemented.  Excessive  cement removed.  I did place a 12.5 poly for the cementing process.  Once  this cement had cured, did feel there was a slight bit of hyperextension  still, although the knee was stable.  I trialed the 15 posterior stabilized  liner, and this felt better to me in extension and flexion, locked the knee  out, more stably in extension.  The final 15 posterior stabilized rotating  platform poly was then impacted into position, then placed onto the tibial  tray following irrigation and removal of any other debris.  A medium Hemovac  drain was placed with an extensor mechanism, which we approximating using a  #1 PDS.  The remainder of the wound was closed in layers with a 4-0 Monocryl  on the skin.  The patient then had the knee cleaned, dried, and dressed  sterilely with Steri-Strips, dressings, sponge, and tape.  Patient was  brought to the recovery room in stable condition.      Madlyn Frankel Charlann Boxer, M.D.  Electronically Signed     MDO/MEDQ  D:  12/29/2004  T:  12/29/2004  Job:  [id number]

## 2010-11-19 LAB — POCT URINALYSIS DIP (DEVICE)
Bilirubin Urine: NEGATIVE
Glucose, UA: NEGATIVE
Ketones, ur: NEGATIVE
Nitrite: POSITIVE — AB
Operator id: 235561
Protein, ur: 30 — AB
Specific Gravity, Urine: 1.015
Urobilinogen, UA: 0.2
pH: 5.5

## 2010-11-19 LAB — URINE CULTURE: Colony Count: >=100000

## 2019-04-10 ENCOUNTER — Other Ambulatory Visit: Payer: Self-pay

## 2019-04-10 ENCOUNTER — Ambulatory Visit: Payer: Medicare HMO | Admitting: Podiatry

## 2019-04-10 ENCOUNTER — Encounter: Payer: Self-pay | Admitting: Podiatry

## 2019-04-10 DIAGNOSIS — L608 Other nail disorders: Secondary | ICD-10-CM | POA: Insufficient documentation

## 2019-04-10 DIAGNOSIS — B351 Tinea unguium: Secondary | ICD-10-CM | POA: Insufficient documentation

## 2019-04-10 DIAGNOSIS — M79674 Pain in right toe(s): Secondary | ICD-10-CM | POA: Diagnosis not present

## 2019-04-10 DIAGNOSIS — M79675 Pain in left toe(s): Secondary | ICD-10-CM

## 2019-04-10 NOTE — Progress Notes (Signed)
This patient presents the office with chief complaint of long thick nails on both big toes.  She says that the nails are painful walking and wearing her shoes.  She is unable to self treat.  She presents the office today for an evaluation and treatment of her painful nails.  General Appearance  Alert, conversant and in no acute stress.  Vascular  Dorsalis pedis and posterior tibial  pulses are palpable  bilaterally.  Capillary return is within normal limits  bilaterally. Temperature is within normal limits  bilaterally.  Neurologic  Senn-Weinstein monofilament wire test within normal limits  bilaterally. Muscle power within normal limits bilaterally.  Nails Thick disfigured discolored nails with subungual debris  Hallux nails  B/L.  Pincer nails  B/L.Marland Kitchen No evidence of bacterial infection or drainage bilaterally.  Orthopedic  No limitations of motion  feet .  No crepitus or effusions noted.  No bony pathology or digital deformities noted.  Skin  normotropic skin with no porokeratosis noted bilaterally.  No signs of infections or ulcers noted.    Onychomycosis  X 2  IE.  Debride nails  X 2.    RTC 3 months.   Gardiner Barefoot DPM

## 2019-05-03 ENCOUNTER — Other Ambulatory Visit: Payer: Self-pay

## 2019-05-03 ENCOUNTER — Ambulatory Visit: Payer: Medicare HMO | Attending: Otolaryngology | Admitting: Physical Therapy

## 2019-05-03 DIAGNOSIS — R2681 Unsteadiness on feet: Secondary | ICD-10-CM | POA: Insufficient documentation

## 2019-05-03 DIAGNOSIS — R2689 Other abnormalities of gait and mobility: Secondary | ICD-10-CM

## 2019-05-03 DIAGNOSIS — M6281 Muscle weakness (generalized): Secondary | ICD-10-CM | POA: Diagnosis present

## 2019-05-03 NOTE — Therapy (Signed)
[facility] [address] [place], [place], [address] Phone: [phone]   Fax:  [phone]  Physical Therapy Evaluation  Patient Details  Name: [name] MRN: [id number] Date of Birth: [birth year] Referring Provider (PT): Izora Gala, MD   Encounter Date: 05/03/2019  PT End of Session - 05/03/19 2053    Visit Number  1    Number of Visits  13    Date for PT Re-Evaluation  07/02/19    Authorization Type  [employer]-will need 10th visit progress note    PT Start Time  1230    PT Stop Time  1325    PT Time Calculation (min)  55 min    Activity Tolerance  Patient tolerated treatment well    Behavior During Therapy  [facility] for tasks assessed/performed       No past medical history on file.  No past surgical history on file.  There were no vitals filed for this visit.   Subjective Assessment - 05/03/19 1236    Subjective  Just the balance-I just am not quite secure.  Feel like it's gotten worse over time.  Standing up too quickly, bending down and standing up, turning head to quickly, stepping down off of a curb are issues.  No falls.  I'm careful.  Does not use assistive device.    Patient is accompained by:  Family member   Daughter   Pertinent History  PMH:  BPPV (currently resolved), CHF, hyperlipidemia, prediabetes, OA, HTN, Hx of B TKR    Patient Stated Goals  To feel more in control of my balance    Currently in Pain?  No/denies         [facility] PT Assessment - 05/03/19 1239      Assessment   Medical Diagnosis  Imbalance    Referring Provider (PT)  Izora Gala, MD    Onset Date/Surgical Date  04/25/19      Precautions   Precautions  Fall      Balance Screen   Has the patient fallen in the past 6 months  No    Has the patient had a decrease in activity level because of a fear of falling?   Yes    Is the patient reluctant to leave their home because of a fear of falling?   No      Home  Film/video editor residence    Living Arrangements  Children   Daughter   Available Help at Discharge  Family    Type of [place]  --   1 step   Home Layout  One level    [place] - 2 wheels;Kasandra Knudsen - single point    Additional Comments  Moved to [place] from [place] with her daughter last March      Prior Function   Level of Independence  Independent    Vocation  Retired    Leisure  Enjoys shopping, goes to gym to do treadmill, Camera operator, bike      Observation/Other Assessments   Focus on Therapeutic Outcomes (FOTO)   NA      Sensation   Light Touch  Appears Intact      ROM / Strength   AROM / PROM / Strength  Strength      Strength   Overall Strength  Deficits    Overall Strength Comments  Appears at least 4/5 bilateral through hip  flexors, quads, hamstrings, ankle dorsiflexors.  Noted Trendelenburg type gait pattern and decreased SLS/tandem stance, indicating decreased glut/hip abductor strength.      Transfers   Transfers  Sit to Stand;Stand to Sit    Sit to Stand  6: Modified independent (Device/Increase time);Without upper extremity assist;From chair/3-in-1    Stand to Sit  6: Modified independent (Device/Increase time);Without upper extremity assist;To chair/3-in-1      Ambulation/Gait   Ambulation/Gait  Yes    Ambulation/Gait Assistance  5: Supervision;4: Min guard    Ambulation/Gait Assistance Details  During DGI, PT provides min guard assistance at times    Ambulation Distance (Feet)  150 Feet    Assistive device  None    Gait Pattern  Step-through pattern;Decreased stride length;Trendelenburg    Ambulation Surface  Level;Indoor    Gait velocity  12.87 sec = 2.55 ft/sec      Standardized Balance Assessment   Standardized Balance Assessment  Dynamic Gait Index;Timed Up and Go Test      Dynamic Gait Index   Level Surface  Mild Impairment   8 sec   Change in Gait Speed  Moderate Impairment    Gait with  Horizontal Head Turns  Severe Impairment    Gait with Vertical Head Turns  Severe Impairment    Gait and Pivot Turn  Mild Impairment    Step Over Obstacle  Severe Impairment    Step Around Obstacles  Mild Impairment    Steps  Mild Impairment    Total Score  9    DGI comment:  Scores <19/24 indicate increased fall risk      Timed Up and Go Test   TUG  Normal TUG    Normal TUG (seconds)  12.85    TUG Comments  Scores >13.5 seconds indicate increased fall risk.      High Level Balance   High Level Balance Comments  Assessed EO and EC on solid surface:  EO x 30 seconds, EC x 12 seconds; on foam, EO x 30 sec with increased sway; EC on foam 2.47 sec.  Tandem stance LLE posterior position 2.4 seconds, RLE posterior position 1.4 seconds.  SLS LLE:  0.94 sec, RLE:  3 seconds                Objective measurements completed on examination: See above findings.              PT Education - 05/03/19 2051    Education Details  Results of eval, POC to address balance deficits; likely vestibular deficits based on pt's performance on EO/EC foam testing.  Discussed (based on pt/daughter's financial concerns) possible transfer to [employer]) Educated  Patient;Child(ren)    Methods  Explanation    Comprehension  Verbalized understanding       PT Short Term Goals - 05/03/19 2101      PT SHORT TERM GOAL #1   Title  Pt will be independent with initial HEP for improved balance, gait, strength.  TARGET 2 weeks for all STGS:  05/18/2019 (May be delayed due to scheduling)    Time  2    Period  Weeks    Status  New      PT SHORT TERM GOAL #2   Title  Pt will improve DGI score to at least 12/24 for decreased fall risk.    Time  2    Period  Weeks    Status  New  PT SHORT TERM GOAL #3   Title  Pt will verbalize understanding of fall prevention in home environment.    Time  2    Period  Weeks        PT Long Term Goals - 05/03/19 2102      PT LONG  TERM GOAL #1   Title  Pt will be independent with progression of HEP to address balance, strength, and gait.  TARGET 6 weeks for all LTGS:  06/15/2019 (may be delayed due to scheduling)    Time  6    Period  Weeks    Status  New      PT LONG TERM GOAL #2   Title  Pt will improve DGI score to at least 17/24 for decreased fall risk.    Time  6    Period  Weeks    Status  New      PT LONG TERM GOAL #3   Title  Pt will improve gait velocity to at least 2.62 ft/sec for improved gait efficiency in community.    Time  6    Period  Weeks      PT LONG TERM GOAL #4   Title  Pt will improve tandem stance to at least 10 seconds each foot position, to demonstrate improved hip stability for balance.    Time  6    Period  Weeks    Status  New             Plan - 05/03/19 2054    Clinical Impression Statement  Pt is an 84 year old female who presents to OPPT with history of imbalance, vertigo (in recent past, but resolved).  She has had no falls, but she presents with decreased lower extremity strength, decreased balance, decreased gait independence/decreased gait velocity.  She ambulates with guarded gait pattern, is classifed as limited community ambulator based on gait velocity, is at fall risk per DGI score of 9/24.  She demonstrates decreased hip stability and likely decreased vestibular system use for balance.  She is active and has recently moved to [facility] from [place] with her daughter.  She would benefit from skilled PT to address the above stated deficits for improved overall functional mobility and decreased fall risk.    Personal Factors and Comorbidities  Comorbidity 3+    Comorbidities  PMH:CHF, hyperlipidemia, prediabetes, OA, HTN, BPPV, Hx of B TKR    Examination-Activity Limitations  Locomotion Level;Reach Overhead;Squat;Stand    Examination-Participation Restrictions  Community Activity;Shop    Stability/Clinical Decision Making  Evolving/Moderate complexity    Clinical Decision  Making  Moderate    Rehab Potential  Good    PT Frequency  2x / week    PT Duration  6 weeks   plus eval (7 weeks total POC)   PT Treatment/Interventions  ADLs/Self Care Home Management;DME Instruction;Neuromuscular re-education;Balance training;Therapeutic exercise;Therapeutic activities;Functional mobility training;Gait training;Patient/family education    PT Next Visit Plan  Initiate HEP to address hip strength, balance (SLS, tandem activities, as well as corner balance exercises for vestibular system use for balance)    Recommended Other Services  Pt/daughter are intereseted in transfer to [employer] location, as they are concerned about co-pay    Consulted and Agree with Plan of Care  Patient;Family member/caregiver    Family Member Consulted  daughter       Patient will benefit from skilled therapeutic intervention in order to improve the following deficits and impairments:  Abnormal gait, Difficulty walking, Decreased balance,  Decreased mobility, Decreased strength  Visit Diagnosis: Other abnormalities of gait and mobility  Unsteadiness on feet  Muscle weakness (generalized)     Problem List Patient Active Problem List   Diagnosis Date Noted  . Pain due to onychomycosis of toenails of both feet 04/10/2019  . 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 HYPERGLYCEMIA, FASTING 08/22/2009  . OSTEOARTHRITIS 06/13/2009  . HEADACHE 06/13/2009  . NEPHROLITHIASIS, HX OF 06/13/2009  . HYPERLIPIDEMIA 06/11/2009  . MIGRAINE HEADACHE 06/11/2009  . HYPERTENSION 06/11/2009  . ARTHRITIS 06/11/2009  . SHOULDER PAIN, LEFT 06/11/2009  . CHEST DISCOMFORT 06/11/2009    Frazier Butt. 05/03/2019, 9:06 PM  Frazier Butt., PT   [facility] [address] [place], [place], [address] Phone: [phone]   Fax:  [phone]  Name: [name] MRN: [id number] Date of Birth: 1935-12-06

## 2019-05-15 ENCOUNTER — Encounter: Payer: Self-pay | Admitting: Physical Therapy

## 2019-05-15 ENCOUNTER — Ambulatory Visit: Payer: Medicare HMO | Admitting: Physical Therapy

## 2019-05-15 ENCOUNTER — Other Ambulatory Visit: Payer: Self-pay

## 2019-05-15 DIAGNOSIS — R2681 Unsteadiness on feet: Secondary | ICD-10-CM | POA: Diagnosis not present

## 2019-05-15 DIAGNOSIS — M6281 Muscle weakness (generalized): Secondary | ICD-10-CM

## 2019-05-15 DIAGNOSIS — R2689 Other abnormalities of gait and mobility: Secondary | ICD-10-CM | POA: Diagnosis not present

## 2019-05-15 NOTE — Patient Instructions (Signed)
Access Code: [email or web address] URL: [email or web address] Date: 05/15/2019 Prepared by: Faustino Congress  Exercises Standing Single Leg Stance with Counter Support - 1 x daily - 7 x weekly - 1 sets - 5 reps - 10-15 sec hold Standing Tandem Balance with Counter Support - 1 x daily - 7 x weekly - 1 sets - 5 reps - 10-15 sec hold Standing Hip Abduction with Counter Support - 1 x daily - 7 x weekly - 20 reps - 1 sets Standing Hip Extension with Counter Support - 1 x daily - 7 x weekly - 20 reps - 1 sets Wide Stance with Head Rotation on Foam Pad - 1 x daily - 7 x weekly - 1 reps - 1 sets - 10 rotations each way

## 2019-05-15 NOTE — Therapy (Signed)
[facility] Physical Therapy [address] [place], [place], [address] Phone: [phone]   Fax:  [phone]  Physical Therapy Treatment  Patient Details  Name: [name] MRN: [id number] Date of Birth: November 03, 1935 Referring Provider (PT): Izora Gala, MD   Encounter Date: 05/15/2019  PT End of Session - 05/15/19 1047    Visit Number  2    Number of Visits  13    Date for PT Re-Evaluation  07/02/19    Authorization Type  [employer]-will need 10th visit progress note    PT Start Time  1015    PT Stop Time  1054    PT Time Calculation (min)  39 min    Activity Tolerance  Patient tolerated treatment well    Behavior During Therapy  [facility] for tasks assessed/performed       History reviewed. No pertinent past medical history.  History reviewed. No pertinent surgical history.  There were no vitals filed for this visit.  Subjective Assessment - 05/15/19 1013    Subjective  doing well.    Patient is accompained by:  Family member   Daughter   Pertinent History  PMH:  BPPV (currently resolved), CHF, hyperlipidemia, prediabetes, OA, HTN, Hx of B TKR    Patient Stated Goals  To feel more in control of my balance    Currently in Pain?  No/denies                       [facility] Adult PT Treatment/Exercise - 05/15/19 1027      Exercises   Exercises  Knee/Hip      Knee/Hip Exercises: Aerobic   Nustep  L5 x 8 min      Knee/Hip Exercises: Standing   Hip Abduction  Both;20 reps;Knee straight    Hip Extension  Both;20 reps;Knee straight          Balance Exercises - 05/15/19 1016      Balance Exercises: Standing   Standing Eyes Opened  Wide (BOA);Foam/compliant surface;Head turns    Tandem Stance  Eyes open;Upper extremity support 1;5 reps;15 secs    SLS  5 reps;15 secs;Eyes open;Upper extremity support 2        PT Education - 05/15/19 1047    Education Details  HEP    Person(s) Educated  Patient    Methods  Explanation     Comprehension  Verbalized understanding;Returned demonstration;Need further instruction       PT Short Term Goals - 05/15/19 1049      PT SHORT TERM GOAL #1   Title  Pt will be independent with initial HEP for improved balance, gait, strength.  TARGET 2 weeks for all STGS:  05/25/19 (updated due to scheduling)    Time  2    Period  Weeks    Status  On-going      PT SHORT TERM GOAL #2   Title  Pt will improve DGI score to at least 12/24 for decreased fall risk.    Time  2    Period  Weeks    Status  On-going      PT SHORT TERM GOAL #3   Title  Pt will verbalize understanding of fall prevention in home environment.    Time  2    Period  Weeks    Status  On-going        PT Long Term Goals - 05/03/19 2102      PT LONG TERM GOAL #1  Title  Pt will be independent with progression of HEP to address balance, strength, and gait.  TARGET 6 weeks for all LTGS:  06/15/2019 (may be delayed due to scheduling)    Time  6    Period  Weeks    Status  New      PT LONG TERM GOAL #2   Title  Pt will improve DGI score to at least 17/24 for decreased fall risk.    Time  6    Period  Weeks    Status  New      PT LONG TERM GOAL #3   Title  Pt will improve gait velocity to at least 2.62 ft/sec for improved gait efficiency in community.    Time  6    Period  Weeks      PT LONG TERM GOAL #4   Title  Pt will improve tandem stance to at least 10 seconds each foot position, to demonstrate improved hip stability for balance.    Time  6    Period  Weeks    Status  New            Plan - 05/15/19 1049    Clinical Impression Statement  Session today focused on establishing HEP to address balance and strength deficits.  Increased difficulty with compliant surface activities.  Will cotninue to benefit from PT to maximize function.  STGs extended due to scheduling difficulties.    Personal Factors and Comorbidities  Comorbidity 3+    Comorbidities  PMH:CHF, hyperlipidemia, prediabetes, OA, HTN,  BPPV, Hx of B TKR    Examination-Activity Limitations  Locomotion Level;Reach Overhead;Squat;Stand    Examination-Participation Restrictions  Community Activity;Shop    Stability/Clinical Decision Making  Evolving/Moderate complexity    Rehab Potential  Good    PT Frequency  2x / week    PT Duration  6 weeks   plus eval (7 weeks total POC)   PT Treatment/Interventions  ADLs/Self Care Home Management;DME Instruction;Neuromuscular re-education;Balance training;Therapeutic exercise;Therapeutic activities;Functional mobility training;Gait training;Patient/family education    PT Next Visit Plan  review HEP, continue balance/strengthening    PT Home Exercise Plan  Access Code: [email or web address]    Consulted and Agree with Plan of Care  Patient;Family member/caregiver    Family Member Consulted  daughter       Patient will benefit from skilled therapeutic intervention in order to improve the following deficits and impairments:  Abnormal gait, Difficulty walking, Decreased balance, Decreased mobility, Decreased strength  Visit Diagnosis: Other abnormalities of gait and mobility  Unsteadiness on feet  Muscle weakness (generalized)     Problem List Patient Active Problem List   Diagnosis Date Noted  . Pain due to onychomycosis of toenails of both feet 04/10/2019  . 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 HYPERGLYCEMIA, FASTING 08/22/2009  . OSTEOARTHRITIS 06/13/2009  . HEADACHE 06/13/2009  . NEPHROLITHIASIS, HX OF 06/13/2009  . HYPERLIPIDEMIA 06/11/2009  . MIGRAINE HEADACHE 06/11/2009  . HYPERTENSION 06/11/2009  . ARTHRITIS 06/11/2009  . SHOULDER PAIN, LEFT 06/11/2009  . CHEST DISCOMFORT 06/11/2009      Laureen Abrahams, PT, DPT 05/15/19 10:52 AM    [facility] Physical Therapy [address] [place], [place], [address] Phone: [phone]   Fax:  [phone]  Name: [name] MRN: [id number] Date of Birth: 1935/10/27

## 2019-05-21 ENCOUNTER — Ambulatory Visit: Payer: Medicare HMO | Admitting: Physical Therapy

## 2019-05-21 ENCOUNTER — Encounter: Payer: Self-pay | Admitting: Physical Therapy

## 2019-05-21 ENCOUNTER — Other Ambulatory Visit: Payer: Self-pay

## 2019-05-21 ENCOUNTER — Ambulatory Visit: Payer: Self-pay | Admitting: Podiatry

## 2019-05-21 DIAGNOSIS — M6281 Muscle weakness (generalized): Secondary | ICD-10-CM | POA: Diagnosis not present

## 2019-05-21 DIAGNOSIS — R2681 Unsteadiness on feet: Secondary | ICD-10-CM | POA: Diagnosis not present

## 2019-05-21 DIAGNOSIS — R2689 Other abnormalities of gait and mobility: Secondary | ICD-10-CM | POA: Diagnosis not present

## 2019-05-21 NOTE — Therapy (Signed)
[facility] Physical Therapy [address] [place], [place], [address] Phone: [phone]   Fax:  [phone]  Physical Therapy Treatment  Patient Details  Name: [name] MRN: [id number] Date of Birth: October 28, 1935 Referring Provider (PT): Izora Gala, MD   Encounter Date: 05/21/2019  PT End of Session - 05/21/19 0923    Visit Number  3    Number of Visits  13    Date for PT Re-Evaluation  07/02/19    Authorization Type  [employer]-will need 10th visit progress note    PT Start Time  0845    PT Stop Time  0925    PT Time Calculation (min)  40 min    Activity Tolerance  Patient tolerated treatment well    Behavior During Therapy  [facility] for tasks assessed/performed       History reviewed. No pertinent past medical history.  History reviewed. No pertinent surgical history.  There were no vitals filed for this visit.  Subjective Assessment - 05/21/19 0848    Subjective  feels the same - only stood on pillow once since last visit, but doing other exercises    Patient is accompained by:  Family member   Daughter   Pertinent History  PMH:  BPPV (currently resolved), CHF, hyperlipidemia, prediabetes, OA, HTN, Hx of B TKR    Patient Stated Goals  To feel more in control of my balance    Currently in Pain?  No/denies                       [facility] Adult PT Treatment/Exercise - 05/21/19 0859      Knee/Hip Exercises: Aerobic   Nustep  L5 x 5 min      Knee/Hip Exercises: Standing   Hip Abduction  Both;20 reps;Knee straight    Hip Extension  Both;20 reps;Knee straight          Balance Exercises - 05/21/19 0852      Balance Exercises: Standing   Standing Eyes Opened  Wide (BOA);Foam/compliant surface;Head turns   progressed to narrow BOS   Tandem Stance  Eyes open;Upper extremity support 1;5 reps;15 secs    SLS  5 reps;15 secs;Eyes open;Upper extremity support 2          PT Short Term Goals - 05/15/19 1049      PT SHORT  TERM GOAL #1   Title  Pt will be independent with initial HEP for improved balance, gait, strength.  TARGET 2 weeks for all STGS:  05/25/19 (updated due to scheduling)    Time  2    Period  Weeks    Status  On-going      PT SHORT TERM GOAL #2   Title  Pt will improve DGI score to at least 12/24 for decreased fall risk.    Time  2    Period  Weeks    Status  On-going      PT SHORT TERM GOAL #3   Title  Pt will verbalize understanding of fall prevention in home environment.    Time  2    Period  Weeks    Status  On-going        PT Long Term Goals - 05/03/19 2102      PT LONG TERM GOAL #1   Title  Pt will be independent with progression of HEP to address balance, strength, and gait.  TARGET 6 weeks for all LTGS:  06/15/2019 (may be delayed due to scheduling)  Time  6    Period  Weeks    Status  New      PT LONG TERM GOAL #2   Title  Pt will improve DGI score to at least 17/24 for decreased fall risk.    Time  6    Period  Weeks    Status  New      PT LONG TERM GOAL #3   Title  Pt will improve gait velocity to at least 2.62 ft/sec for improved gait efficiency in community.    Time  6    Period  Weeks      PT LONG TERM GOAL #4   Title  Pt will improve tandem stance to at least 10 seconds each foot position, to demonstrate improved hip stability for balance.    Time  6    Period  Weeks    Status  New            Plan - 05/21/19 [id number]    Clinical Impression Statement  Demonstrated improved balance on compliant surfaces today and able to progress to narrow BOS for home exercises.  Will continue to benefit from PT to maximize function.    Personal Factors and Comorbidities  Comorbidity 3+    Comorbidities  PMH:CHF, hyperlipidemia, prediabetes, OA, HTN, BPPV, Hx of B TKR    Examination-Activity Limitations  Locomotion Level;Reach Overhead;Squat;Stand    Examination-Participation Restrictions  Community Activity;Shop    Stability/Clinical Decision Making   Evolving/Moderate complexity    Rehab Potential  Good    PT Frequency  2x / week    PT Duration  6 weeks   plus eval (7 weeks total POC)   PT Treatment/Interventions  ADLs/Self Care Home Management;DME Instruction;Neuromuscular re-education;Balance training;Therapeutic exercise;Therapeutic activities;Functional mobility training;Gait training;Patient/family education    PT Next Visit Plan  continue balance/strengthening, recheck DGI    PT Home Exercise Plan  Access Code: [email or web address]    Consulted and Agree with Plan of Care  Patient       Patient will benefit from skilled therapeutic intervention in order to improve the following deficits and impairments:  Abnormal gait, Difficulty walking, Decreased balance, Decreased mobility, Decreased strength  Visit Diagnosis: Other abnormalities of gait and mobility  Unsteadiness on feet  Muscle weakness (generalized)     Problem List Patient Active Problem List   Diagnosis Date Noted  . Pain due to onychomycosis of toenails of both feet 04/10/2019  . 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 HYPERGLYCEMIA, FASTING 08/22/2009  . OSTEOARTHRITIS 06/13/2009  . HEADACHE 06/13/2009  . NEPHROLITHIASIS, HX OF 06/13/2009  . HYPERLIPIDEMIA 06/11/2009  . MIGRAINE HEADACHE 06/11/2009  . HYPERTENSION 06/11/2009  . ARTHRITIS 06/11/2009  . SHOULDER PAIN, LEFT 06/11/2009  . CHEST DISCOMFORT 06/11/2009      Laureen Abrahams, PT, DPT 05/21/19 9:27 AM    [facility] Physical Therapy [address] [place], [place], [address] Phone: [phone]   Fax:  [phone]  Name: JAYLI GUTTERMAN MRN: [id number] Date of Birth: 05/13/1935

## 2019-05-23 ENCOUNTER — Ambulatory Visit: Payer: Medicare HMO | Admitting: Physical Therapy

## 2019-05-23 ENCOUNTER — Encounter: Payer: Self-pay | Admitting: Physical Therapy

## 2019-05-23 ENCOUNTER — Other Ambulatory Visit: Payer: Self-pay

## 2019-05-23 DIAGNOSIS — R2689 Other abnormalities of gait and mobility: Secondary | ICD-10-CM

## 2019-05-23 DIAGNOSIS — M6281 Muscle weakness (generalized): Secondary | ICD-10-CM

## 2019-05-23 DIAGNOSIS — R2681 Unsteadiness on feet: Secondary | ICD-10-CM | POA: Diagnosis not present

## 2019-05-23 NOTE — Patient Instructions (Signed)

## 2019-05-23 NOTE — Therapy (Signed)
[facility] Physical Therapy [address] [place], [place], [address] Phone: [phone]   Fax:  [phone]  Physical Therapy Treatment  Patient Details  Name: [name] MRN: [id number] Date of Birth: [birth year] Referring Provider (PT): Izora Gala, MD   Encounter Date: 05/23/2019  PT End of Session - 05/23/19 1011    Visit Number  4    Number of Visits  13    Date for PT Re-Evaluation  07/02/19    Authorization Type  [employer]-will need 10th visit progress note    PT Start Time  0927    PT Stop Time  1008    PT Time Calculation (min)  41 min    Activity Tolerance  Patient tolerated treatment well    Behavior During Therapy  [facility] for tasks assessed/performed       History reviewed. No pertinent past medical history.  History reviewed. No pertinent surgical history.  There were no vitals filed for this visit.  Subjective Assessment - 05/23/19 0928    Subjective  feels like she's doing better; able to stand on a pillow with feet together without difficulty today.    Patient is accompained by:  Family member   Daughter   Pertinent History  PMH:  BPPV (currently resolved), CHF, hyperlipidemia, prediabetes, OA, HTN, Hx of B TKR    Patient Stated Goals  To feel more in control of my balance    Currently in Pain?  No/denies         [facility] PT Assessment - 05/23/19 0938      Assessment   Medical Diagnosis  Imbalance    Referring Provider (PT)  Izora Gala, MD      Dynamic Gait Index   Level Surface  Mild Impairment    Change in Gait Speed  Mild Impairment    Gait with Horizontal Head Turns  Moderate Impairment    Gait with Vertical Head Turns  Mild Impairment    Gait and Pivot Turn  Normal    Step Over Obstacle  Mild Impairment    Step Around Obstacles  Normal    Steps  Mild Impairment    Total Score  17    DGI comment:  Scores <19/24 indicate increased fall risk                   [facility] Adult PT Treatment/Exercise -  05/23/19 0001      Self-Care   Self-Care  Other Self-Care Comments    Other Self-Care Comments   fall prevention strategies          Balance Exercises - 05/23/19 0953      Balance Exercises: Standing   Standing Eyes Opened  Foam/compliant surface;Narrow base of support (BOS);Head turns    Standing Eyes Closed  Foam/compliant surface;Wide (BOA);3 reps;10 secs    Gait with Head Turns  Forward;Upper extremity support;4 reps   horizontal/vertical       PT Education - 05/23/19 1011    Education Details  fall prevention    Person(s) Educated  Patient    Methods  Explanation;Handout    Comprehension  Verbalized understanding       PT Short Term Goals - 05/23/19 1011      PT SHORT TERM GOAL #1   Title  Pt will be independent with initial HEP for improved balance, gait, strength.  TARGET 2 weeks for all STGS:  05/25/19 (updated due to scheduling)    Time  2    Period  Weeks    Status  Achieved      PT SHORT TERM GOAL #2   Title  Pt will improve DGI score to at least 12/24 for decreased fall risk.    Time  2    Period  Weeks    Status  Achieved      PT SHORT TERM GOAL #3   Title  Pt will verbalize understanding of fall prevention in home environment.    Time  2    Period  Weeks    Status  Achieved        PT Long Term Goals - 05/23/19 1012      PT LONG TERM GOAL #1   Title  Pt will be independent with progression of HEP to address balance, strength, and gait.  TARGET 6 weeks for all LTGS:  06/15/2019 (may be delayed due to scheduling)    Time  6    Period  Weeks    Status  On-going      PT LONG TERM GOAL #2   Title  Pt will improve DGI score to at least 20/24 for decreased fall risk.    Time  6    Period  Weeks    Status  Revised      PT LONG TERM GOAL #3   Title  Pt will improve gait velocity to at least 2.62 ft/sec for improved gait efficiency in community.    Time  6    Period  Weeks    Status  On-going      PT LONG TERM GOAL #4   Title  Pt will improve  tandem stance to at least 10 seconds each foot position, to demonstrate improved hip stability for balance.    Time  6    Period  Weeks    Status  On-going            Plan - 05/23/19 1012    Clinical Impression Statement  Pt has met all STGs and overall doing well with PT.  DGI improved to 17/24 so LTG revised today.  Still has difficulty with dynamic gait and especially with visual disruptions.  Will continue to benefit from PT to maximize function.    Personal Factors and Comorbidities  Comorbidity 3+    Comorbidities  PMH:CHF, hyperlipidemia, prediabetes, OA, HTN, BPPV, Hx of B TKR    Examination-Activity Limitations  Locomotion Level;Reach Overhead;Squat;Stand    Examination-Participation Restrictions  Community Activity;Shop    Stability/Clinical Decision Making  Evolving/Moderate complexity    Rehab Potential  Good    PT Frequency  2x / week    PT Duration  6 weeks   plus eval (7 weeks total POC)   PT Treatment/Interventions  ADLs/Self Care Home Management;DME Instruction;Neuromuscular re-education;Balance training;Therapeutic exercise;Therapeutic activities;Functional mobility training;Gait training;Patient/family education    PT Next Visit Plan  continue balance/strengthening, dynamic gait activities    PT Home Exercise Plan  Access Code: [email or web address]    Consulted and Agree with Plan of Care  Patient       Patient will benefit from skilled therapeutic intervention in order to improve the following deficits and impairments:  Abnormal gait, Difficulty walking, Decreased balance, Decreased mobility, Decreased strength  Visit Diagnosis: Other abnormalities of gait and mobility  Unsteadiness on feet  Muscle weakness (generalized)     Problem List Patient Active Problem List   Diagnosis Date Noted  . Pain due to onychomycosis of toenails of both feet 04/10/2019  . 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  HYPERGLYCEMIA, FASTING 08/22/2009  . OSTEOARTHRITIS 06/13/2009  .  HEADACHE 06/13/2009  . NEPHROLITHIASIS, HX OF 06/13/2009  . HYPERLIPIDEMIA 06/11/2009  . MIGRAINE HEADACHE 06/11/2009  . HYPERTENSION 06/11/2009  . ARTHRITIS 06/11/2009  . SHOULDER PAIN, LEFT 06/11/2009  . CHEST DISCOMFORT 06/11/2009      Laureen Abrahams, PT, DPT 05/23/19 10:14 AM    [facility] Physical Therapy [address] [place], [place], [address] Phone: [phone]   Fax:  [phone]  Name: AUNYA LEMLER MRN: [id number] Date of Birth: [birth year]

## 2019-05-28 ENCOUNTER — Other Ambulatory Visit: Payer: Self-pay

## 2019-05-28 ENCOUNTER — Ambulatory Visit: Payer: Medicare HMO | Admitting: Physical Therapy

## 2019-05-28 ENCOUNTER — Encounter: Payer: Self-pay | Admitting: Physical Therapy

## 2019-05-28 DIAGNOSIS — R2681 Unsteadiness on feet: Secondary | ICD-10-CM

## 2019-05-28 DIAGNOSIS — R2689 Other abnormalities of gait and mobility: Secondary | ICD-10-CM

## 2019-05-28 DIAGNOSIS — M6281 Muscle weakness (generalized): Secondary | ICD-10-CM

## 2019-05-28 NOTE — Therapy (Signed)
[facility] Physical Therapy [address] [place], [place], [address] Phone: [phone]   Fax:  [phone]  Physical Therapy Treatment  Patient Details  Name: [name] MRN: [id number] Date of Birth: 03/01/1935 Referring Provider (PT): Izora Gala, MD   Encounter Date: 05/28/2019  PT End of Session - 05/28/19 1416    Visit Number  5    Number of Visits  13    Date for PT Re-Evaluation  07/02/19    Authorization Type  [employer] Medicare-will need 10th visit progress note    PT Start Time  1312    PT Stop Time  1350    PT Time Calculation (min)  38 min    Activity Tolerance  Patient tolerated treatment well    Behavior During Therapy  [facility] for tasks assessed/performed       History reviewed. No pertinent past medical history.  History reviewed. No pertinent surgical history.  There were no vitals filed for this visit.  Subjective Assessment - 05/28/19 1310    Subjective  doing well; no falls.  still can't walk with head turns    Patient is accompained by:  Family member   Daughter   Pertinent History  PMH:  BPPV (currently resolved), CHF, hyperlipidemia, prediabetes, OA, HTN, Hx of B TKR    Patient Stated Goals  To feel more in control of my balance    Currently in Pain?  No/denies                       [facility] Adult PT Treatment/Exercise - 05/28/19 1347      Knee/Hip Exercises: Aerobic   Nustep  L6 x 6 min          Balance Exercises - 05/28/19 1318      Balance Exercises: Standing   Gait with Head Turns  Forward;Upper extremity support;4 reps   horizontal/vertical   Tandem Gait  Forward;Intermittent upper extremity support;4 reps;Retro    Sidestepping  Upper extremity support   with crossovers   Other Standing Exercises  forward/lateral step ups x 15 reps bil with intermittent UE support    Other Standing Exercises Comments  standing on compliant surface holding ball: up/down and circles x 10 then ball toss x 15           PT Short Term Goals - 05/23/19 1011      PT SHORT TERM GOAL #1   Title  Pt will be independent with initial HEP for improved balance, gait, strength.  TARGET 2 weeks for all STGS:  05/25/19 (updated due to scheduling)    Time  2    Period  Weeks    Status  Achieved      PT SHORT TERM GOAL #2   Title  Pt will improve DGI score to at least 12/24 for decreased fall risk.    Time  2    Period  Weeks    Status  Achieved      PT SHORT TERM GOAL #3   Title  Pt will verbalize understanding of fall prevention in home environment.    Time  2    Period  Weeks    Status  Achieved        PT Long Term Goals - 05/23/19 1012      PT LONG TERM GOAL #1   Title  Pt will be independent with progression of HEP to address balance, strength, and gait.  TARGET 6 weeks for all LTGS:  06/15/2019 (may be delayed due to scheduling)    Time  6    Period  Weeks    Status  On-going      PT LONG TERM GOAL #2   Title  Pt will improve DGI score to at least 20/24 for decreased fall risk.    Time  6    Period  Weeks    Status  Revised      PT LONG TERM GOAL #3   Title  Pt will improve gait velocity to at least 2.62 ft/sec for improved gait efficiency in community.    Time  6    Period  Weeks    Status  On-going      PT LONG TERM GOAL #4   Title  Pt will improve tandem stance to at least 10 seconds each foot position, to demonstrate improved hip stability for balance.    Time  6    Period  Weeks    Status  On-going            Plan - 05/28/19 1417    Clinical Impression Statement  Improved gait with head turns today despite pt feeling off balance, noted narrow base of support with ambulation so worked on widening BOS to more natural stance.  Progressing well with PT.    Personal Factors and Comorbidities  Comorbidity 3+    Comorbidities  PMH:CHF, hyperlipidemia, prediabetes, OA, HTN, BPPV, Hx of B TKR    Examination-Activity Limitations  Locomotion Level;Reach Overhead;Squat;Stand     Examination-Participation Restrictions  Community Activity;Shop    Stability/Clinical Decision Making  Evolving/Moderate complexity    Rehab Potential  Good    PT Frequency  2x / week    PT Duration  6 weeks   plus eval (7 weeks total POC)   PT Treatment/Interventions  ADLs/Self Care Home Management;DME Instruction;Neuromuscular re-education;Balance training;Therapeutic exercise;Therapeutic activities;Functional mobility training;Gait training;Patient/family education    PT Next Visit Plan  continue balance/strengthening, dynamic gait activities    PT Home Exercise Plan  Access Code: [email or web address]    Consulted and Agree with Plan of Care  Patient       Patient will benefit from skilled therapeutic intervention in order to improve the following deficits and impairments:  Abnormal gait, Difficulty walking, Decreased balance, Decreased mobility, Decreased strength  Visit Diagnosis: Other abnormalities of gait and mobility  Unsteadiness on feet  Muscle weakness (generalized)     Problem List Patient Active Problem List   Diagnosis Date Noted  . Pain due to onychomycosis of toenails of both feet 04/10/2019  . 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 HYPERGLYCEMIA, FASTING 08/22/2009  . OSTEOARTHRITIS 06/13/2009  . HEADACHE 06/13/2009  . NEPHROLITHIASIS, HX OF 06/13/2009  . HYPERLIPIDEMIA 06/11/2009  . MIGRAINE HEADACHE 06/11/2009  . HYPERTENSION 06/11/2009  . ARTHRITIS 06/11/2009  . SHOULDER PAIN, LEFT 06/11/2009  . CHEST DISCOMFORT 06/11/2009      Laureen Abrahams, PT, DPT 05/28/19 2:18 PM     [facility] Physical Therapy [address] [place], [place], [address] Phone: [phone]   Fax:  [phone]  Name: [name] MRN: [id number] Date of Birth: April 09, 1935

## 2019-05-30 ENCOUNTER — Encounter: Payer: Self-pay | Admitting: Physical Therapy

## 2019-05-30 ENCOUNTER — Other Ambulatory Visit: Payer: Self-pay

## 2019-05-30 ENCOUNTER — Ambulatory Visit: Payer: Medicare HMO | Admitting: Physical Therapy

## 2019-05-30 DIAGNOSIS — R2681 Unsteadiness on feet: Secondary | ICD-10-CM | POA: Diagnosis not present

## 2019-05-30 DIAGNOSIS — M6281 Muscle weakness (generalized): Secondary | ICD-10-CM | POA: Diagnosis not present

## 2019-05-30 DIAGNOSIS — R2689 Other abnormalities of gait and mobility: Secondary | ICD-10-CM

## 2019-05-30 NOTE — Therapy (Signed)
[facility] [address] [place], [place], [address] Phone: [phone]   Fax:  [phone]  Physical Therapy Treatment  Patient Details  Name: [name] MRN: [id number] Date of Birth: 10-Nov-1935 Referring Provider (PT): Izora Gala, MD   Encounter Date: 05/30/2019  PT End of Session - 05/30/19 1535    Visit Number  6    Number of Visits  13    Date for PT Re-Evaluation  07/02/19    Authorization Type  [employer] Medicare-will need 10th visit progress note    PT Start Time  1315    PT Stop Time  1355    PT Time Calculation (min)  40 min    Activity Tolerance  Patient tolerated treatment well    Behavior During Therapy  [facility] for tasks assessed/performed       History reviewed. No pertinent past medical history.  History reviewed. No pertinent surgical history.  There were no vitals filed for this visit.  Subjective Assessment - 05/30/19 1533    Subjective  doing well, no complaints    Patient is accompained by:  Family member   Daughter   Pertinent History  PMH:  BPPV (currently resolved), CHF, hyperlipidemia, prediabetes, OA, HTN, Hx of B TKR    Patient Stated Goals  To feel more in control of my balance    Currently in Pain?  No/denies                            Balance Exercises - 05/30/19 1321      Balance Exercises: Standing   Tandem Gait  Forward;Retro;Intermittent upper extremity support;5 reps    Step Over Hurdles / Cones  6" cones: single tap/double tap/kicking over, uprighting with min guard A    Heel Raises  Both;20 reps    Toe Raise  Both;20 reps    Other Standing Exercises  dynamic gait activities: marching forwards/backwards, forwards with EC, and horizontal head turns with minguard A and cues for balance - gait with head turns outdoors as well          PT Short Term Goals - 05/23/19 1011      PT SHORT TERM GOAL #1   Title  Pt will be independent with initial HEP for improved balance,  gait, strength.  TARGET 2 weeks for all STGS:  05/25/19 (updated due to scheduling)    Time  2    Period  Weeks    Status  Achieved      PT SHORT TERM GOAL #2   Title  Pt will improve DGI score to at least 12/24 for decreased fall risk.    Time  2    Period  Weeks    Status  Achieved      PT SHORT TERM GOAL #3   Title  Pt will verbalize understanding of fall prevention in home environment.    Time  2    Period  Weeks    Status  Achieved        PT Long Term Goals - 05/23/19 1012      PT LONG TERM GOAL #1   Title  Pt will be independent with progression of HEP to address balance, strength, and gait.  TARGET 6 weeks for all LTGS:  06/15/2019 (may be delayed due to scheduling)    Time  6    Period  Weeks    Status  On-going      PT  LONG TERM GOAL #2   Title  Pt will improve DGI score to at least 20/24 for decreased fall risk.    Time  6    Period  Weeks    Status  Revised      PT LONG TERM GOAL #3   Title  Pt will improve gait velocity to at least 2.62 ft/sec for improved gait efficiency in community.    Time  6    Period  Weeks    Status  On-going      PT LONG TERM GOAL #4   Title  Pt will improve tandem stance to at least 10 seconds each foot position, to demonstrate improved hip stability for balance.    Time  6    Period  Weeks    Status  On-going            Plan - 05/30/19 1536    Clinical Impression Statement  Pt continues to do well with PT, still having some trouble with dynamic gait activities.  Dynamic gait improves with cues.  Will continue to benefit from PT to maximize function.    Personal Factors and Comorbidities  Comorbidity 3+    Comorbidities  PMH:CHF, hyperlipidemia, prediabetes, OA, HTN, BPPV, Hx of B TKR    Examination-Activity Limitations  Locomotion Level;Reach Overhead;Squat;Stand    Examination-Participation Restrictions  Community Activity;Shop    Stability/Clinical Decision Making  Evolving/Moderate complexity    Rehab Potential  Good     PT Frequency  2x / week    PT Duration  6 weeks   plus eval (7 weeks total POC)   PT Treatment/Interventions  ADLs/Self Care Home Management;DME Instruction;Neuromuscular re-education;Balance training;Therapeutic exercise;Therapeutic activities;Functional mobility training;Gait training;Patient/family education    PT Next Visit Plan  continue balance/strengthening, dynamic gait activities - may be ready for d/c    PT Home Exercise Plan  Access Code: [email or web address]    Consulted and Agree with Plan of Care  Patient       Patient will benefit from skilled therapeutic intervention in order to improve the following deficits and impairments:  Abnormal gait, Difficulty walking, Decreased balance, Decreased mobility, Decreased strength  Visit Diagnosis: Other abnormalities of gait and mobility  Unsteadiness on feet  Muscle weakness (generalized)     Problem List Patient Active Problem List   Diagnosis Date Noted  . Pain due to onychomycosis of toenails of both feet 04/10/2019  . 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 HYPERGLYCEMIA, FASTING 08/22/2009  . OSTEOARTHRITIS 06/13/2009  . HEADACHE 06/13/2009  . NEPHROLITHIASIS, HX OF 06/13/2009  . HYPERLIPIDEMIA 06/11/2009  . MIGRAINE HEADACHE 06/11/2009  . HYPERTENSION 06/11/2009  . ARTHRITIS 06/11/2009  . SHOULDER PAIN, LEFT 06/11/2009  . CHEST DISCOMFORT 06/11/2009     Laureen Abrahams, PT, DPT 05/30/19 3:40 PM    [facility] [address] [place], [place], [address] Phone: [phone]   Fax:  [phone]  Name: [name] MRN: [id number] Date of Birth: May 24, 1935

## 2019-06-05 ENCOUNTER — Encounter: Payer: Medicare HMO | Admitting: Physical Therapy

## 2019-06-12 ENCOUNTER — Ambulatory Visit: Payer: Medicare HMO | Admitting: Physical Therapy

## 2019-06-12 ENCOUNTER — Other Ambulatory Visit: Payer: Self-pay

## 2019-06-12 DIAGNOSIS — R2689 Other abnormalities of gait and mobility: Secondary | ICD-10-CM

## 2019-06-12 DIAGNOSIS — R2681 Unsteadiness on feet: Secondary | ICD-10-CM

## 2019-06-12 DIAGNOSIS — M6281 Muscle weakness (generalized): Secondary | ICD-10-CM

## 2019-06-12 NOTE — Therapy (Signed)
[facility] [address] [place], [place], [address] Phone: [phone]   Fax:  [phone]  Physical Therapy Treatment/Discharge Summary  Patient Details  Name: KATORIA YETMAN MRN: [id number] Date of Birth: 01/06/1936 Referring Provider (PT): Izora Gala, MD   Encounter Date: 06/12/2019  PT End of Session - 06/12/19 1139    Visit Number  7    Number of Visits  13    Date for PT Re-Evaluation  07/02/19    Authorization Type  [employer] Medicare-will need 10th visit progress note    PT Start Time  1100    PT Stop Time  1139    PT Time Calculation (min)  39 min    Activity Tolerance  Patient tolerated treatment well    Behavior During Therapy  [facility] for tasks assessed/performed       No past medical history on file.  No past surgical history on file.  There were no vitals filed for this visit.  Subjective Assessment - 06/12/19 1102    Subjective  "why do i have to turn my head when i walk?"    Patient is accompained by:  Family member   Daughter   Pertinent History  PMH:  BPPV (currently resolved), CHF, hyperlipidemia, prediabetes, OA, HTN, Hx of B TKR    Patient Stated Goals  To feel more in control of my balance    Currently in Pain?  No/denies         [facility] PT Assessment - 06/12/19 1110      Assessment   Medical Diagnosis  Imbalance    Referring Provider (PT)  Izora Gala, MD      Ambulation/Gait   Gait velocity  3.43 ft/sec      Dynamic Gait Index   Level Surface  Normal    Change in Gait Speed  Normal    Gait with Horizontal Head Turns  Mild Impairment    Gait with Vertical Head Turns  Normal    Gait and Pivot Turn  Normal    Step Over Obstacle  Normal    Step Around Obstacles  Normal    Steps  Mild Impairment    Total Score  22    DGI comment:  Scores <19/24 indicate increased fall risk      High Level Balance   High Level Balance Comments  Tandem stance > 10 sec bil                   [facility] Adult PT  Treatment/Exercise - 06/12/19 1110      Knee/Hip Exercises: Standing   Hip Abduction  Both;20 reps;Knee straight    Hip Extension  Both;20 reps;Knee straight          Balance Exercises - 06/12/19 1134      Balance Exercises: Standing   Standing Eyes Opened  Foam/compliant surface;Narrow base of support (BOS);Head turns          PT Short Term Goals - 05/23/19 1011      PT SHORT TERM GOAL #1   Title  Pt will be independent with initial HEP for improved balance, gait, strength.  TARGET 2 weeks for all STGS:  05/25/19 (updated due to scheduling)    Time  2    Period  Weeks    Status  Achieved      PT SHORT TERM GOAL #2   Title  Pt will improve DGI score to at least 12/24 for decreased fall risk.    Time  2    Period  Weeks    Status  Achieved      PT SHORT TERM GOAL #3   Title  Pt will verbalize understanding of fall prevention in home environment.    Time  2    Period  Weeks    Status  Achieved        PT Long Term Goals - 06/12/19 1139      PT LONG TERM GOAL #1   Title  Pt will be independent with progression of HEP to address balance, strength, and gait.  TARGET 6 weeks for all LTGS:  06/15/2019 (may be delayed due to scheduling)    Time  6    Period  Weeks    Status  Achieved      PT LONG TERM GOAL #2   Title  Pt will improve DGI score to at least 20/24 for decreased fall risk.    Time  6    Period  Weeks    Status  Achieved      PT LONG TERM GOAL #3   Title  Pt will improve gait velocity to at least 2.62 ft/sec for improved gait efficiency in community.    Time  6    Period  Weeks    Status  Achieved      PT LONG TERM GOAL #4   Title  Pt will improve tandem stance to at least 10 seconds each foot position, to demonstrate improved hip stability for balance.    Time  6    Period  Weeks    Status  Achieved            Plan - 06/12/19 1140    Clinical Impression Statement  Pt has met all goals and is ready for d/c from PT today.  Discussed  importance of continued exercise to maximize progress and decrease fall risk.  Will d/c PT today.    Personal Factors and Comorbidities  Comorbidity 3+    Comorbidities  PMH:CHF, hyperlipidemia, prediabetes, OA, HTN, BPPV, Hx of B TKR    Examination-Activity Limitations  Locomotion Level;Reach Overhead;Squat;Stand    Examination-Participation Restrictions  Community Activity;Shop    Stability/Clinical Decision Making  Evolving/Moderate complexity    Rehab Potential  Good    PT Frequency  2x / week    PT Duration  6 weeks   plus eval (7 weeks total POC)   PT Treatment/Interventions  ADLs/Self Care Home Management;DME Instruction;Neuromuscular re-education;Balance training;Therapeutic exercise;Therapeutic activities;Functional mobility training;Gait training;Patient/family education    PT Next Visit Plan  d/c PT today    PT Home Exercise Plan  Access Code: [email or web address]    Consulted and Agree with Plan of Care  Patient       Patient will benefit from skilled therapeutic intervention in order to improve the following deficits and impairments:  Abnormal gait, Difficulty walking, Decreased balance, Decreased mobility, Decreased strength  Visit Diagnosis: Other abnormalities of gait and mobility  Unsteadiness on feet  Muscle weakness (generalized)     Problem List Patient Active Problem List   Diagnosis Date Noted  . Pain due to onychomycosis of toenails of both feet 04/10/2019  . 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 HYPERGLYCEMIA, FASTING 08/22/2009  . OSTEOARTHRITIS 06/13/2009  . HEADACHE 06/13/2009  . NEPHROLITHIASIS, HX OF 06/13/2009  . HYPERLIPIDEMIA 06/11/2009  . MIGRAINE HEADACHE 06/11/2009  . HYPERTENSION 06/11/2009  . ARTHRITIS 06/11/2009  . SHOULDER PAIN, LEFT 06/11/2009  . CHEST DISCOMFORT 06/11/2009      Colletta Maryland  Staci Righter, PT, DPT 06/12/19 11:41 AM   [facility] Physical Therapy [address] [place], [place], [address] Phone: [phone]    Fax:  [phone]  Name: VIRIGINIA AMENDOLA MRN: [id number] Date of Birth: [birth year]     PHYSICAL THERAPY DISCHARGE SUMMARY  Visits from Start of Care: 7  Current functional level related to goals / functional outcomes: See above   Remaining deficits: See above   Education / Equipment: HEP  Plan: Patient agrees to discharge.  Patient goals were met. Patient is being discharged due to meeting the stated rehab goals.  ?????     Laureen Abrahams, PT, DPT 06/12/19 11:42 AM [facility] Physical Therapy [address] [place], [place], [address] Phone: [phone]   Fax:  [phone]

## 2019-06-14 ENCOUNTER — Encounter: Payer: Medicare HMO | Admitting: Physical Therapy

## 2019-07-10 ENCOUNTER — Ambulatory Visit: Payer: Medicare HMO | Admitting: Podiatry

## 2019-10-26 ENCOUNTER — Encounter (INDEPENDENT_AMBULATORY_CARE_PROVIDER_SITE_OTHER): Payer: Medicare HMO | Admitting: Ophthalmology

## 2019-10-31 ENCOUNTER — Encounter (INDEPENDENT_AMBULATORY_CARE_PROVIDER_SITE_OTHER): Payer: Medicare HMO | Admitting: Ophthalmology

## 2019-10-31 NOTE — Progress Notes (Signed)
[facility] Note  11/05/2019     CHIEF COMPLAINT Patient presents for Retina Evaluation   HISTORY OF PRESENT ILLNESS: [name] is a 84 y.o. female who presents to the clinic today for:   HPI    Retina Evaluation    In both eyes.  This started weeks ago.  Duration of weeks.  Context:  distance vision and near vision.  I, the attending physician,  performed the HPI with the patient and updated documentation appropriately.          Comments    Pt states vision is pretty good but is sometimes "fluttery".  Patient denies eye pain or discomfort.  Patient complains of watery eyes while reading.  Patient denies any new or worsening floaters or fol OU.  CEIOL OU (Dr. Bland Span in [facility])       Last edited by Bernarda Caffey, MD on 11/05/2019  9:22 AM. (History)    pt is here on the referral of Dr. Gershon Crane for concern of non-exu ARMD, pt states this is only her second time seeing him for routine eye exam, pt used to live in [facility] and saw Dr. Bland Span, she states he did cataract sx on her several years ago, pt states she has known for maybe 3-4 years that she has macular degeneration, pt states she takes an eye vitamin every day and has an amsler grid, but she does not look at it  Referring physician: Rutherford Guys, MD [place],  [place] [phone]  HISTORICAL INFORMATION:   Selected notes from the MEDICAL RECORD NUMBER Referred by Dr. Gershon Crane for eval of AMD OU LEE: 08.25.21 Ocular Hx- Pseudophakia OU PMH- DM    CURRENT MEDICATIONS: No current outpatient medications on file. (Ophthalmic Drugs)   No current facility-administered medications for this visit. (Ophthalmic Drugs)   Current Outpatient Medications (Other)  Medication Sig  . hydrALAZINE (APRESOLINE) 100 MG tablet   . isosorbide mononitrate (IMDUR) 30 MG 24 hr tablet   . metoprolol tartrate (LOPRESSOR) 50 MG tablet   . pravastatin (PRAVACHOL) 40 MG tablet    No current  facility-administered medications for this visit. (Other)      REVIEW OF SYSTEMS: ROS    Positive for: Eyes   Negative for: Constitutional, Gastrointestinal, Neurological, Skin, Genitourinary, Musculoskeletal, HENT, Endocrine, Cardiovascular, Respiratory, Psychiatric, Allergic/Imm, Heme/Lymph   Last edited by Doneen Poisson on 11/05/2019  8:51 AM. (History)       ALLERGIES Allergies  Allergen Reactions  . Sulfonamide Derivatives   . Ciprofloxacin Itching and Rash  . Lovastatin Itching and Rash  . Sulfamethoxazole Itching and Rash    PAST MEDICAL HISTORY History reviewed. No pertinent past medical history. History reviewed. No pertinent surgical history.  FAMILY HISTORY History reviewed. No pertinent family history.  SOCIAL HISTORY Social History   Tobacco Use  . Smoking status: Not on file  Substance Use Topics  . Alcohol use: Not on file  . Drug use: Not on file         OPHTHALMIC EXAM:  Base Eye Exam    Visual Acuity (Snellen - Linear)      Right Left   Dist cc 20/30 +2 20/40 +1   Dist ph cc NI    Correction: Glasses       Tonometry (Tonopen, 9:06 AM)      Right Left   Pressure 14 13       Pupils      Dark Light  Shape React APD   Right 4 3 Round Brisk 0   Left 4 3 Round Brisk 0       Visual Fields      Left Right    Full Full       Extraocular Movement      Right Left    Full Full       Neuro/Psych    Oriented x3: Yes   Mood/Affect: Normal       Dilation    Both eyes: 1.0% Mydriacyl, 2.5% Phenylephrine @ 9:06 AM        Slit Lamp and Fundus Exam    Slit Lamp Exam      Right Left   Lids/Lashes Dermatochalasis - upper lid Dermatochalasis - upper lid   Conjunctiva/Sclera White and quiet White and quiet   Cornea arcus, trace Punctate epithelial erosions arcus, trace Punctate epithelial erosions   Anterior Chamber Deep and quiet Deep and quiet   Iris Round and dilated Round and dilated   Lens PC IOL in good position with open  PC PC IOL in good position with open PC   Vitreous Vitreous syneresis Vitreous syneresis, Posterior vitreous detachment       Fundus Exam      Right Left   Disc Pink and Sharp, +cupping Pink and Sharp   C/D Ratio 0.65 0.6   Macula Flat, Blunted foveal reflex, Drusen, RPE mottling Flat, Blunted foveal reflex, Drusen, RPE mottling and clumping, central small PED   Vessels Vascular attenuation, Tortuous Vascular attenuation, Tortuous   Periphery Attached, no heme  Attached, no heme         Refraction    Wearing Rx      Sphere Cylinder Axis   Right -1.00 +0.50 085   Left -0.50 +1.00 105   Age: 84 yr       Manifest Refraction      Sphere Cylinder Axis Dist VA   Right -0.50 +0.50 085 20/30+2   Left -0.50 +0.75 105 20/30-2          IMAGING AND PROCEDURES  Imaging and Procedures for 11/05/2019  OCT, Retina - OU - Both Eyes       Right Eye Quality was good. Central Foveal Thickness: 247. Progression has no prior data. Findings include normal foveal contour, no IRF, no SRF, retinal drusen .   Left Eye Quality was good. Central Foveal Thickness: 258. Progression has no prior data. Findings include normal foveal contour, no IRF, no SRF, retinal drusen , pigment epithelial detachment.   Notes *Images captured and stored on drive  Diagnosis / Impression:  Non-exu ARMD OU  OS: mild central PEDs  Clinical management:  See below  Abbreviations: NFP - Normal foveal profile. CME - cystoid macular edema. PED - pigment epithelial detachment. IRF - intraretinal fluid. SRF - subretinal fluid. EZ - ellipsoid zone. ERM - epiretinal membrane. ORA - outer retinal atrophy. ORT - outer retinal tubulation. SRHM - subretinal hyper-reflective material. IRHM - intraretinal hyper-reflective material                 ASSESSMENT/PLAN:    ICD-10-CM   1. Intermediate stage nonexudative age-related macular degeneration of both eyes  H35.3132   2. Retinal edema  H35.81 OCT, Retina - OU -  Both Eyes  3. Essential hypertension  I10   4. Hypertensive retinopathy of both eyes  H35.033   5. Pseudophakia of both eyes  Z96.1     1,2. Age related macular degeneration, non-exudative,  OU  - intermediate stage  - The incidence, anatomy, and pathology of dry AMD, risk of progression, and the AREDS and AREDS 2 studies including smoking risks discussed with patient.  - Recommend amsler grid monitoring  - f/u 3-4 months, OCT, DFE  3,4. Hypertensive retinopathy OU - discussed importance of tight BP control - monitor  5. Pseudophakia OU  - s/p CE/IOL OU (Dr. Adora Fridge, [place])  - IOLs in good position, doing well  - monitor   Ophthalmic Meds Ordered this visit:  No orders of the defined types were placed in this encounter.      Return in about 3 months (around 02/04/2020) for f/u 3-4 months, exu ARMD OU, DFE, OCT.  There are no Patient Instructions on file for this visit.  This document serves as a record of services personally performed by Gardiner Sleeper, MD, PhD. It was created on their behalf by Leeann Must, [place], an ophthalmic technician. The creation of this record is the provider's dictation and/or activities during the visit.    Electronically signed by: Leeann Must, COA @TODAY @ 12:02 PM  Gardiner Sleeper, M.D., Ph.D. Diseases & Surgery of the Retina and [place] 11/05/2019   I have reviewed the above documentation for accuracy and completeness, and I agree with the above. Gardiner Sleeper, M.D., Ph.D. 11/05/19 12:02 PM   Abbreviations: M myopia (nearsighted); A astigmatism; H hyperopia (farsighted); P presbyopia; Mrx spectacle prescription;  CTL contact lenses; OD right eye; OS left eye; OU both eyes  XT exotropia; ET esotropia; PEK punctate epithelial keratitis; PEE punctate epithelial erosions; DES dry eye syndrome; MGD meibomian gland dysfunction; ATs artificial tears; PFAT's preservative free artificial tears; [place]  nuclear sclerotic cataract; PSC posterior subcapsular cataract; ERM epi-retinal membrane; PVD posterior vitreous detachment; RD retinal detachment; DM diabetes mellitus; DR diabetic retinopathy; NPDR non-proliferative diabetic retinopathy; PDR proliferative diabetic retinopathy; CSME clinically significant macular edema; DME diabetic macular edema; dbh dot blot hemorrhages; CWS cotton wool spot; POAG primary open angle glaucoma; C/D cup-to-disc ratio; HVF humphrey visual field; GVF goldmann visual field; OCT optical coherence tomography; IOP intraocular pressure; BRVO Branch retinal vein occlusion; CRVO central retinal vein occlusion; CRAO central retinal artery occlusion; BRAO branch retinal artery occlusion; RT retinal tear; SB scleral buckle; PPV pars plana vitrectomy; VH Vitreous hemorrhage; PRP panretinal laser photocoagulation; IVK intravitreal kenalog; VMT vitreomacular traction; MH Macular hole;  NVD neovascularization of the disc; NVE neovascularization elsewhere; AREDS age related eye disease study; ARMD age related macular degeneration; POAG primary open angle glaucoma; EBMD epithelial/anterior basement membrane dystrophy; ACIOL anterior chamber intraocular lens; IOL intraocular lens; PCIOL posterior chamber intraocular lens; Phaco/IOL phacoemulsification with intraocular lens placement; [place] photorefractive keratectomy; LASIK laser assisted in situ keratomileusis; HTN hypertension; DM diabetes mellitus; COPD chronic obstructive pulmonary disease

## 2019-11-05 ENCOUNTER — Encounter (INDEPENDENT_AMBULATORY_CARE_PROVIDER_SITE_OTHER): Payer: Self-pay | Admitting: Ophthalmology

## 2019-11-05 ENCOUNTER — Ambulatory Visit (INDEPENDENT_AMBULATORY_CARE_PROVIDER_SITE_OTHER): Payer: Medicare HMO | Admitting: Ophthalmology

## 2019-11-05 ENCOUNTER — Other Ambulatory Visit: Payer: Self-pay

## 2019-11-05 DIAGNOSIS — H353132 Nonexudative age-related macular degeneration, bilateral, intermediate dry stage: Secondary | ICD-10-CM | POA: Diagnosis not present

## 2019-11-05 DIAGNOSIS — Z961 Presence of intraocular lens: Secondary | ICD-10-CM

## 2019-11-05 DIAGNOSIS — H3581 Retinal edema: Secondary | ICD-10-CM

## 2019-11-05 DIAGNOSIS — H35033 Hypertensive retinopathy, bilateral: Secondary | ICD-10-CM | POA: Diagnosis not present

## 2019-11-05 DIAGNOSIS — I10 Essential (primary) hypertension: Secondary | ICD-10-CM

## 2019-11-08 ENCOUNTER — Encounter (INDEPENDENT_AMBULATORY_CARE_PROVIDER_SITE_OTHER): Payer: Medicare HMO | Admitting: Ophthalmology

## 2020-02-25 ENCOUNTER — Encounter (INDEPENDENT_AMBULATORY_CARE_PROVIDER_SITE_OTHER): Payer: Medicare HMO | Admitting: Ophthalmology

## 2020-03-10 ENCOUNTER — Encounter (INDEPENDENT_AMBULATORY_CARE_PROVIDER_SITE_OTHER): Payer: Medicare HMO | Admitting: Ophthalmology

## 2020-03-11 NOTE — Progress Notes (Shared)
[facility] Note  03/14/2020     CHIEF COMPLAINT Patient presents for No chief complaint on file.   HISTORY OF PRESENT ILLNESS: [name] is a 85 y.o. female who presents to the clinic today for:   pt is here on the referral of Dr. Gershon Crane for concern of non-exu ARMD, pt states this is only her second time seeing him for routine eye exam, pt used to live in [facility] and saw Dr. Bland Span, she states he did cataract sx on her several years ago, pt states she has known for maybe 3-4 years that she has macular degeneration, pt states she takes an eye vitamin every day and has an Personal assistant grid, but she does not look at it  Referring physician: Joya Gaskins, FNP [address] [place],  [place] [address]  HISTORICAL INFORMATION:   Selected notes from the [place] Referred by Dr. Gershon Crane for eval of AMD OU LEE: 08.25.21 Ocular Hx- Pseudophakia OU PMH- DM    CURRENT MEDICATIONS: No current outpatient medications on file. (Ophthalmic Drugs)   No current facility-administered medications for this visit. (Ophthalmic Drugs)   Current Outpatient Medications (Other)  Medication Sig  . hydrALAZINE (APRESOLINE) 100 MG tablet   . isosorbide mononitrate (IMDUR) 30 MG 24 hr tablet   . metoprolol tartrate (LOPRESSOR) 50 MG tablet   . pravastatin (PRAVACHOL) 40 MG tablet    No current facility-administered medications for this visit. (Other)      REVIEW OF SYSTEMS:    ALLERGIES Allergies  Allergen Reactions  . Sulfonamide Derivatives   . Ciprofloxacin Itching and Rash  . Lovastatin Itching and Rash  . Sulfamethoxazole Itching and Rash    PAST MEDICAL HISTORY No past medical history on file. No past surgical history on file.  FAMILY HISTORY No family history on file.  SOCIAL HISTORY         OPHTHALMIC EXAM:  Not recorded     IMAGING AND PROCEDURES  Imaging and Procedures for 03/14/2020            ASSESSMENT/PLAN:    ICD-10-CM   1. Intermediate stage nonexudative age-related macular degeneration of both eyes  H35.3132   2. Retinal edema  H35.81   3. Essential hypertension  I10   4. Hypertensive retinopathy of both eyes  H35.033   5. Pseudophakia of both eyes  Z96.1     1,2. Age related macular degeneration, non-exudative, OU  - intermediate stage  - The incidence, anatomy, and pathology of dry AMD, risk of progression, and the AREDS and AREDS 2 studies including smoking risks discussed with patient.  - Recommend amsler grid monitoring  - f/u 3-4 months, OCT, DFE  3,4. Hypertensive retinopathy OU - discussed importance of tight BP control - monitor  5. Pseudophakia OU  - s/p CE/IOL OU (Dr. Adora Fridge, [place])  - IOLs in good position, doing well  - monitor   Ophthalmic Meds Ordered this visit:  No orders of the defined types were placed in this encounter.      No follow-ups on file.  There are no Patient Instructions on file for this visit.  This document serves as a record of services personally performed by Gardiner Sleeper, MD, PhD. It was created on their behalf by San Jetty. Owens Shark, OA an ophthalmic technician. The creation of this record is the provider's dictation and/or activities during the visit.    Electronically signed by: San Jetty. Owens Shark, [place] 01.18.2022  12:45 PM  Gardiner Sleeper, M.D., Ph.D. Diseases & Surgery of the Retina and Vitreous [facility]  Abbreviations: M myopia (nearsighted); A astigmatism; H hyperopia (farsighted); P presbyopia; Mrx spectacle prescription;  CTL contact lenses; OD right eye; OS left eye; OU both eyes  XT exotropia; ET esotropia; PEK punctate epithelial keratitis; PEE punctate epithelial erosions; DES dry eye syndrome; MGD meibomian gland dysfunction; ATs artificial tears; PFAT's preservative free artificial tears; [place] nuclear sclerotic cataract; PSC posterior subcapsular cataract; ERM epi-retinal  membrane; PVD posterior vitreous detachment; RD retinal detachment; DM diabetes mellitus; DR diabetic retinopathy; NPDR non-proliferative diabetic retinopathy; PDR proliferative diabetic retinopathy; CSME clinically significant macular edema; DME diabetic macular edema; dbh dot blot hemorrhages; CWS cotton wool spot; POAG primary open angle glaucoma; C/D cup-to-disc ratio; HVF humphrey visual field; GVF goldmann visual field; OCT optical coherence tomography; IOP intraocular pressure; BRVO Branch retinal vein occlusion; CRVO central retinal vein occlusion; CRAO central retinal artery occlusion; BRAO branch retinal artery occlusion; RT retinal tear; SB scleral buckle; PPV pars plana vitrectomy; VH Vitreous hemorrhage; PRP panretinal laser photocoagulation; IVK intravitreal kenalog; VMT vitreomacular traction; MH Macular hole;  NVD neovascularization of the disc; NVE neovascularization elsewhere; AREDS age related eye disease study; ARMD age related macular degeneration; POAG primary open angle glaucoma; EBMD epithelial/anterior basement membrane dystrophy; ACIOL anterior chamber intraocular lens; IOL intraocular lens; PCIOL posterior chamber intraocular lens; Phaco/IOL phacoemulsification with intraocular lens placement; [place] photorefractive keratectomy; LASIK laser assisted in situ keratomileusis; HTN hypertension; DM diabetes mellitus; COPD chronic obstructive pulmonary disease

## 2020-03-14 ENCOUNTER — Encounter (INDEPENDENT_AMBULATORY_CARE_PROVIDER_SITE_OTHER): Payer: Medicare HMO | Admitting: Ophthalmology

## 2020-03-14 DIAGNOSIS — H35033 Hypertensive retinopathy, bilateral: Secondary | ICD-10-CM

## 2020-03-14 DIAGNOSIS — Z961 Presence of intraocular lens: Secondary | ICD-10-CM

## 2020-03-14 DIAGNOSIS — H3581 Retinal edema: Secondary | ICD-10-CM

## 2020-03-14 DIAGNOSIS — H353132 Nonexudative age-related macular degeneration, bilateral, intermediate dry stage: Secondary | ICD-10-CM

## 2020-03-14 DIAGNOSIS — I10 Essential (primary) hypertension: Secondary | ICD-10-CM

## 2020-03-20 NOTE — Progress Notes (Signed)
[facility] Note  03/24/2020     CHIEF COMPLAINT Patient presents for Retina Follow Up   HISTORY OF PRESENT ILLNESS: [name] is a 85 y.o. female who presents to the clinic today for:   HPI    Retina Follow Up    Patient presents with  Dry AMD.  In both eyes.  Duration of 4 months.  Since onset it is stable.  I, the attending physician,  performed the HPI with the patient and updated documentation appropriately.          Comments    4 month follow up ARMD OU- Vision stable OU.  Using AT's prn and AREDs everyday.           Last edited by Bernarda Caffey, MD on 03/24/2020  1:39 PM. (History)    pt states vision is about the same, left eye is worse than right, she is taking AREDS every day, she goes back to see Dr. Gershon Crane in August  Referring physician: Rutherford Guys, MD [place],  [place] [address]  HISTORICAL INFORMATION:   Selected notes from the [facility] Referred by Dr. Gershon Crane for eval of AMD OU LEE: 08.25.21 Ocular Hx- Pseudophakia OU PMH- DM    CURRENT MEDICATIONS: No current outpatient medications on file. (Ophthalmic Drugs)   No current facility-administered medications for this visit. (Ophthalmic Drugs)   Current Outpatient Medications (Other)  Medication Sig  . hydrALAZINE (APRESOLINE) 100 MG tablet   . isosorbide mononitrate (IMDUR) 30 MG 24 hr tablet   . metoprolol tartrate (LOPRESSOR) 50 MG tablet   . pravastatin (PRAVACHOL) 40 MG tablet    No current facility-administered medications for this visit. (Other)      REVIEW OF SYSTEMS: ROS    Positive for: Neurological, Musculoskeletal, Eyes   Negative for: Constitutional, Gastrointestinal, Skin, Genitourinary, HENT, Endocrine, Cardiovascular, Respiratory, Psychiatric, Allergic/Imm, Heme/Lymph   Last edited by Leonie Douglas, COA on 03/24/2020  9:12 AM. (History)       ALLERGIES Allergies  Allergen Reactions  . Sulfonamide  Derivatives   . Ciprofloxacin Itching and Rash  . Lovastatin Itching and Rash  . Sulfamethoxazole Itching and Rash    PAST MEDICAL HISTORY History reviewed. No pertinent past medical history. History reviewed. No pertinent surgical history.  FAMILY HISTORY History reviewed. No pertinent family history.  SOCIAL HISTORY         OPHTHALMIC EXAM:  Base Eye Exam    Visual Acuity (Snellen - Linear)      Right Left   Dist cc 20/25 -2 20/30 +2   Dist ph cc NI NI   Correction: Glasses       Tonometry (Tonopen, 9:19 AM)      Right Left   Pressure 13 11       Pupils      Dark Light Shape React APD   Right 4 3 Round Brisk None   Left 4 3 Round Brisk None       Visual Fields (Counting fingers)      Left Right    Full Full       Extraocular Movement      Right Left    Full Full       Neuro/Psych    Oriented x3: Yes   Mood/Affect: Normal       Dilation    Both eyes: 1.0% Mydriacyl, 2.5% Phenylephrine @ 9:19 AM        Slit Lamp and  Fundus Exam    Slit Lamp Exam      Right Left   Lids/Lashes Dermatochalasis - upper lid Dermatochalasis - upper lid   Conjunctiva/Sclera White and quiet White and quiet   Cornea arcus, 1+Punctate epithelial erosions arcus, trace Punctate epithelial erosions   Anterior Chamber Deep and quiet Deep and quiet   Iris Round and dilated Round and dilated   Lens PC IOL in good position with open PC PC IOL in good position with open PC   Vitreous Vitreous syneresis Vitreous syneresis, Posterior vitreous detachment       Fundus Exam      Right Left   Disc Pink and Sharp, +cupping Pink and Sharp   C/D Ratio 0.65 0.6   Macula Flat, Blunted foveal reflex, Drusen, RPE mottling and clumping, No heme or edema Flat, Blunted foveal reflex, Drusen, RPE mottling and clumping, central small PED -- stable from prior   Vessels Vascular attenuation, Tortuous mild attenuation, mild tortuousity   Periphery Attached, no heme  Attached, trace reticular  degeneration, No heme         Refraction    Wearing Rx      Sphere Cylinder Axis   Right -1.00 +0.50 085   Left -0.50 +1.00 105          IMAGING AND PROCEDURES  Imaging and Procedures for 03/24/2020  OCT, Retina - OU - Both Eyes       Right Eye Quality was good. Central Foveal Thickness: 244. Progression has been stable. Findings include normal foveal contour, no IRF, no SRF, retinal drusen .   Left Eye Quality was good. Central Foveal Thickness: 260. Progression has been stable. Findings include normal foveal contour, no IRF, no SRF, retinal drusen , pigment epithelial detachment.   Notes *Images captured and stored on drive  Diagnosis / Impression:  Non-exu ARMD OU  OS: mild central PEDs -- no significant change from prior  Clinical management:  See below  Abbreviations: NFP - Normal foveal profile. CME - cystoid macular edema. PED - pigment epithelial detachment. IRF - intraretinal fluid. SRF - subretinal fluid. EZ - ellipsoid zone. ERM - epiretinal membrane. ORA - outer retinal atrophy. ORT - outer retinal tubulation. SRHM - subretinal hyper-reflective material. IRHM - intraretinal hyper-reflective material                 ASSESSMENT/PLAN:    ICD-10-CM   1. Intermediate stage nonexudative age-related macular degeneration of both eyes  H35.3132   2. Retinal edema  H35.81 OCT, Retina - OU - Both Eyes  3. Essential hypertension  I10   4. Hypertensive retinopathy of both eyes  H35.033   5. Pseudophakia of both eyes  Z96.1     1,2. Age related macular degeneration, non-exudative, OU  - intermediate stage -- stable  - The incidence, anatomy, and pathology of dry AMD, risk of progression, and the AREDS and AREDS 2 studies including smoking risks discussed with patient.  - Recommend amsler grid monitoring  - f/u 4-6 months, OCT, DFE  3,4. Hypertensive retinopathy OU - discussed importance of tight BP control - monitor  5. Pseudophakia OU  - s/p CE/IOL  OU (Dr. Adora Fridge, [place])  - IOLs in good position, doing well  - monitor   Ophthalmic Meds Ordered this visit:  No orders of the defined types were placed in this encounter.      Return for f/u 4-6 months, non-exu ARMD OU, DFE, OCT.  This document serves as a record  of services personally performed by Gardiner Sleeper, MD, PhD. It was created on their behalf by Leeann Must, [place], an ophthalmic technician. The creation of this record is the provider's dictation and/or activities during the visit.    Electronically signed by: Leeann Must, [place] 01.27.2022 1:39 PM   This document serves as a record of services personally performed by Gardiner Sleeper, MD, PhD. It was created on their behalf by San Jetty. Owens Shark, OA an ophthalmic technician. The creation of this record is the provider's dictation and/or activities during the visit.    Electronically signed by: San Jetty. Owens Shark, [place] 01.31.2022 1:39 PM  Gardiner Sleeper, M.D., Ph.D. Diseases & Surgery of the Retina and [place] 03/24/2020   I have reviewed the above documentation for accuracy and completeness, and I agree with the above. Gardiner Sleeper, M.D., Ph.D. 03/24/20 1:39 PM   Abbreviations: M myopia (nearsighted); A astigmatism; H hyperopia (farsighted); P presbyopia; Mrx spectacle prescription;  CTL contact lenses; OD right eye; OS left eye; OU both eyes  XT exotropia; ET esotropia; PEK punctate epithelial keratitis; PEE punctate epithelial erosions; DES dry eye syndrome; MGD meibomian gland dysfunction; ATs artificial tears; PFAT's preservative free artificial tears; [place] nuclear sclerotic cataract; PSC posterior subcapsular cataract; ERM epi-retinal membrane; PVD posterior vitreous detachment; RD retinal detachment; DM diabetes mellitus; DR diabetic retinopathy; NPDR non-proliferative diabetic retinopathy; PDR proliferative diabetic retinopathy; CSME clinically significant macular edema; DME  diabetic macular edema; dbh dot blot hemorrhages; CWS cotton wool spot; POAG primary open angle glaucoma; C/D cup-to-disc ratio; HVF humphrey visual field; GVF goldmann visual field; OCT optical coherence tomography; IOP intraocular pressure; BRVO Branch retinal vein occlusion; CRVO central retinal vein occlusion; CRAO central retinal artery occlusion; BRAO branch retinal artery occlusion; RT retinal tear; SB scleral buckle; PPV pars plana vitrectomy; VH Vitreous hemorrhage; PRP panretinal laser photocoagulation; IVK intravitreal kenalog; VMT vitreomacular traction; MH Macular hole;  NVD neovascularization of the disc; NVE neovascularization elsewhere; AREDS age related eye disease study; ARMD age related macular degeneration; POAG primary open angle glaucoma; EBMD epithelial/anterior basement membrane dystrophy; ACIOL anterior chamber intraocular lens; IOL intraocular lens; PCIOL posterior chamber intraocular lens; Phaco/IOL phacoemulsification with intraocular lens placement; [place] photorefractive keratectomy; LASIK laser assisted in situ keratomileusis; HTN hypertension; DM diabetes mellitus; COPD chronic obstructive pulmonary disease

## 2020-03-24 ENCOUNTER — Encounter (INDEPENDENT_AMBULATORY_CARE_PROVIDER_SITE_OTHER): Payer: Self-pay | Admitting: Ophthalmology

## 2020-03-24 ENCOUNTER — Other Ambulatory Visit: Payer: Self-pay

## 2020-03-24 ENCOUNTER — Ambulatory Visit (INDEPENDENT_AMBULATORY_CARE_PROVIDER_SITE_OTHER): Payer: Medicare HMO | Admitting: Ophthalmology

## 2020-03-24 DIAGNOSIS — Z961 Presence of intraocular lens: Secondary | ICD-10-CM

## 2020-03-24 DIAGNOSIS — H35033 Hypertensive retinopathy, bilateral: Secondary | ICD-10-CM | POA: Diagnosis not present

## 2020-03-24 DIAGNOSIS — H353132 Nonexudative age-related macular degeneration, bilateral, intermediate dry stage: Secondary | ICD-10-CM | POA: Diagnosis not present

## 2020-03-24 DIAGNOSIS — I10 Essential (primary) hypertension: Secondary | ICD-10-CM | POA: Diagnosis not present

## 2020-03-24 DIAGNOSIS — H3581 Retinal edema: Secondary | ICD-10-CM

## 2020-07-22 NOTE — Progress Notes (Signed)
[facility] Note  07/23/2020     CHIEF COMPLAINT Patient presents for Retina Follow Up   HISTORY OF PRESENT ILLNESS: [name] is a 85 y.o. female who presents to the clinic today for:   HPI    Retina Follow Up    Patient presents with  Dry AMD.  In both eyes.  Duration of 4 months.  Since onset it is stable.  I, the attending physician,  performed the HPI with the patient and updated documentation appropriately.          Comments    4 month follow up ARMD OU- At times vision is better than others OU.  She is explaining this "fluttering" that makes her not see as well.  She has tried to close one eye vs the other but unable to tell which one.  Unable to tell if it is a feeling vs something she is seeing.  States it is the eyeball instead of eyelid.  Doesn't last long if she closes her eyes and relaxes.  She is taking an eye vitamin through [facility], unsure which kind.        Last edited by Bernarda Caffey, MD on 07/25/2020 10:56 PM. (History)    pt states she feels like her left eye vision is worse than her right eye, she states sometimes she feels like she can see okay and other days she feels like she cannot see, she is checking her vision on the amsler grid  Referring physician: Joya Gaskins, FNP [address] [place],  [place] [address]  HISTORICAL INFORMATION:   Selected notes from the [place] Referred by Dr. Gershon Crane for eval of AMD OU LEE: 08.25.21 Ocular Hx- Pseudophakia OU PMH- DM    CURRENT MEDICATIONS: No current outpatient medications on file. (Ophthalmic Drugs)   No current facility-administered medications for this visit. (Ophthalmic Drugs)   Current Outpatient Medications (Other)  Medication Sig  . hydrALAZINE (APRESOLINE) 100 MG tablet   . isosorbide mononitrate (IMDUR) 30 MG 24 hr tablet   . metoprolol tartrate (LOPRESSOR) 50 MG tablet   . pravastatin (PRAVACHOL) 40 MG tablet    No current  facility-administered medications for this visit. (Other)      REVIEW OF SYSTEMS: ROS    Positive for: Neurological, Musculoskeletal, Eyes   Negative for: Constitutional, Gastrointestinal, Skin, Genitourinary, HENT, Endocrine, Cardiovascular, Respiratory, Psychiatric, Allergic/Imm, Heme/Lymph   Last edited by Leonie Douglas, COA on 07/23/2020  9:05 AM. (History)       ALLERGIES Allergies  Allergen Reactions  . Sulfonamide Derivatives   . Ciprofloxacin Itching and Rash  . Lovastatin Itching and Rash  . Sulfamethoxazole Itching and Rash    PAST MEDICAL HISTORY History reviewed. No pertinent past medical history. History reviewed. No pertinent surgical history.  FAMILY HISTORY History reviewed. No pertinent family history.  SOCIAL HISTORY         OPHTHALMIC EXAM:  Base Eye Exam    Visual Acuity (Snellen - Linear)      Right Left   Dist cc 20/25 -2 20/30 +1   Dist ph cc 20/25 NI   Correction: Glasses       Tonometry (Tonopen, 9:11 AM)      Right Left   Pressure 12 10       Pupils      Dark Light Shape React APD   Right 4 3 Round Brisk None   Left 4 3 Round Brisk None  Visual Fields (Counting fingers)      Left Right    Full Full       Extraocular Movement      Right Left    Full Full       Neuro/Psych    Oriented x3: Yes   Mood/Affect: Normal       Dilation    Both eyes: 1.0% Mydriacyl, 2.5% Phenylephrine @ 9:11 AM        Slit Lamp and Fundus Exam    Slit Lamp Exam      Right Left   Lids/Lashes Dermatochalasis - upper lid Dermatochalasis - upper lid   Conjunctiva/Sclera White and quiet White and quiet   Cornea arcus, 1+fine Punctate epithelial erosions arcus, trace Punctate epithelial erosions   Anterior Chamber Deep and quiet Deep and quiet   Iris Round and dilated Round and dilated   Lens PC IOL in good position with open PC PC IOL in good position with open PC -- small opening   Vitreous Vitreous syneresis Vitreous syneresis,  Posterior vitreous detachment       Fundus Exam      Right Left   Disc Pink and Sharp, +cupping Pink and Sharp   C/D Ratio 0.65 0.6   Macula Flat, Blunted foveal reflex, Drusen, RPE mottling and clumping, No heme or edema Flat, Blunted foveal reflex, Drusen, RPE mottling and clumping, central small PED -- stable from prior   Vessels Vascular attenuation, Tortuous mild attenuation, mild tortuousity   Periphery Attached, no heme  Attached, trace reticular degeneration, No heme         Refraction    Wearing Rx      Sphere Cylinder Axis   Right -1.00 +0.50 085   Left -0.50 +1.00 105          IMAGING AND PROCEDURES  Imaging and Procedures for 07/23/2020  OCT, Retina - OU - Both Eyes       Right Eye Quality was good. Central Foveal Thickness: 246. Progression has been stable. Findings include normal foveal contour, no IRF, no SRF, retinal drusen .   Left Eye Quality was good. Central Foveal Thickness: 259. Progression has been stable. Findings include normal foveal contour, no IRF, no SRF, retinal drusen , pigment epithelial detachment, outer retinal atrophy.   Notes *Images captured and stored on drive  Diagnosis / Impression:  Non-exu ARMD OU  OS: mild central PEDs -- no significant change from prior  Clinical management:  See below  Abbreviations: NFP - Normal foveal profile. CME - cystoid macular edema. PED - pigment epithelial detachment. IRF - intraretinal fluid. SRF - subretinal fluid. EZ - ellipsoid zone. ERM - epiretinal membrane. ORA - outer retinal atrophy. ORT - outer retinal tubulation. SRHM - subretinal hyper-reflective material. IRHM - intraretinal hyper-reflective material                 ASSESSMENT/PLAN:    ICD-10-CM   1. Intermediate stage nonexudative age-related macular degeneration of both eyes  H35.3132   2. Retinal edema  H35.81 OCT, Retina - OU - Both Eyes  3. Essential hypertension  I10   4. Hypertensive retinopathy of both eyes  H35.033    5. Pseudophakia of both eyes  Z96.1     1,2. Age related macular degeneration, non-exudative, OU  - intermediate stage -- stable  - The incidence, anatomy, and pathology of dry AMD, risk of progression, and the AREDS and AREDS 2 studies including smoking risks discussed with patient.  - Continue  amsler grid monitoring  - f/u 6 months, OCT, DFE  3,4. Hypertensive retinopathy OU - discussed importance of tight BP control - monitor  5. Pseudophakia OU  - s/p CE/IOL OU (Dr. Adora Fridge, [place])  - IOLs in good position, doing well  - monitor   Ophthalmic Meds Ordered this visit:  No orders of the defined types were placed in this encounter.      Return in about 6 months (around 01/22/2021) for f/u non-exu ARMD OU, DFE, OCT.  This document serves as a record of services personally performed by Gardiner Sleeper, MD, PhD. It was created on their behalf by Roselee Nova, [place]. The creation of this record is the provider's dictation and/or activities during the visit.  Electronically signed by: Roselee Nova, [place] 07/25/20 11:15 PM  This document serves as a record of services personally performed by Gardiner Sleeper, MD, PhD. It was created on their behalf by San Jetty. Owens Shark, OA an ophthalmic technician. The creation of this record is the provider's dictation and/or activities during the visit.    Electronically signed by: San Jetty. Owens Shark, [place] 06.01.2022 11:15 PM   Gardiner Sleeper, M.D., Ph.D. Diseases & Surgery of the Retina and Vitreous [facility]   I have reviewed the above documentation for accuracy and completeness, and I agree with the above. Gardiner Sleeper, M.D., Ph.D. 07/25/20 11:15 PM   Abbreviations: M myopia (nearsighted); A astigmatism; H hyperopia (farsighted); P presbyopia; Mrx spectacle prescription;  CTL contact lenses; OD right eye; OS left eye; OU both eyes  XT exotropia; ET esotropia; PEK punctate epithelial keratitis; PEE punctate  epithelial erosions; DES dry eye syndrome; MGD meibomian gland dysfunction; ATs artificial tears; PFAT's preservative free artificial tears; [place] nuclear sclerotic cataract; PSC posterior subcapsular cataract; ERM epi-retinal membrane; PVD posterior vitreous detachment; RD retinal detachment; DM diabetes mellitus; DR diabetic retinopathy; NPDR non-proliferative diabetic retinopathy; PDR proliferative diabetic retinopathy; CSME clinically significant macular edema; DME diabetic macular edema; dbh dot blot hemorrhages; CWS cotton wool spot; POAG primary open angle glaucoma; C/D cup-to-disc ratio; HVF humphrey visual field; GVF goldmann visual field; OCT optical coherence tomography; IOP intraocular pressure; BRVO Branch retinal vein occlusion; CRVO central retinal vein occlusion; CRAO central retinal artery occlusion; BRAO branch retinal artery occlusion; RT retinal tear; SB scleral buckle; PPV pars plana vitrectomy; VH Vitreous hemorrhage; PRP panretinal laser photocoagulation; IVK intravitreal kenalog; VMT vitreomacular traction; MH Macular hole;  NVD neovascularization of the disc; NVE neovascularization elsewhere; AREDS age related eye disease study; ARMD age related macular degeneration; POAG primary open angle glaucoma; EBMD epithelial/anterior basement membrane dystrophy; ACIOL anterior chamber intraocular lens; IOL intraocular lens; PCIOL posterior chamber intraocular lens; Phaco/IOL phacoemulsification with intraocular lens placement; [place] photorefractive keratectomy; LASIK laser assisted in situ keratomileusis; HTN hypertension; DM diabetes mellitus; COPD chronic obstructive pulmonary disease

## 2020-07-23 ENCOUNTER — Other Ambulatory Visit: Payer: Self-pay

## 2020-07-23 ENCOUNTER — Ambulatory Visit (INDEPENDENT_AMBULATORY_CARE_PROVIDER_SITE_OTHER): Payer: Medicare HMO | Admitting: Ophthalmology

## 2020-07-23 DIAGNOSIS — Z961 Presence of intraocular lens: Secondary | ICD-10-CM

## 2020-07-23 DIAGNOSIS — I10 Essential (primary) hypertension: Secondary | ICD-10-CM | POA: Diagnosis not present

## 2020-07-23 DIAGNOSIS — H35033 Hypertensive retinopathy, bilateral: Secondary | ICD-10-CM | POA: Diagnosis not present

## 2020-07-23 DIAGNOSIS — H3581 Retinal edema: Secondary | ICD-10-CM | POA: Diagnosis not present

## 2020-07-23 DIAGNOSIS — H353132 Nonexudative age-related macular degeneration, bilateral, intermediate dry stage: Secondary | ICD-10-CM

## 2020-07-25 ENCOUNTER — Encounter (INDEPENDENT_AMBULATORY_CARE_PROVIDER_SITE_OTHER): Payer: Self-pay | Admitting: Ophthalmology

## 2020-12-04 ENCOUNTER — Other Ambulatory Visit: Payer: Self-pay | Admitting: Family Medicine

## 2020-12-04 DIAGNOSIS — Z13820 Encounter for screening for osteoporosis: Secondary | ICD-10-CM

## 2021-01-19 NOTE — Progress Notes (Addendum)
[facility]  01/22/2021     CHIEF COMPLAINT Patient presents for Retina Follow Up   HISTORY OF PRESENT ILLNESS: [name] is a 85 y.o. female who presents to the clinic today for:   HPI     Retina Follow Up   Patient presents with  Dry AMD.  In both eyes.  This started 6 months ago.  I, the attending physician,  performed the HPI with the patient and updated documentation appropriately.        Comments   Patient states here for 6 months retina follow up for non exu ARMD OU. Patient states vision doing good. No eye pain. Added lisinopril 10 mg.      Last edited by Bernarda Caffey, MD on 01/22/2021 10:50 AM.    pt states no change in vision, she is taking AREDS 2 and checking her vision on an amsler grid, she states car lights bother her when driving at night   Referring physician: Joya Gaskins, FNP [id number] [place],  [place] [id number]  HISTORICAL INFORMATION:   Selected notes from the [facility] Referred by Dr. Gershon Crane for eval of AMD OU LEE: 08.25.21 Ocular Hx- Pseudophakia OU PMH- DM    CURRENT MEDICATIONS: No current outpatient medications on file. (Ophthalmic Drugs)   No current facility-administered medications for this visit. (Ophthalmic Drugs)   Current Outpatient Medications (Other)  Medication Sig   hydrALAZINE (APRESOLINE) 100 MG tablet    isosorbide mononitrate (IMDUR) 30 MG 24 hr tablet    metoprolol tartrate (LOPRESSOR) 50 MG tablet    pravastatin (PRAVACHOL) 40 MG tablet    No current facility-administered medications for this visit. (Other)   REVIEW OF SYSTEMS: ROS   Positive for: Neurological, Musculoskeletal, Eyes Negative for: Constitutional, Gastrointestinal, Skin, Genitourinary, HENT, Endocrine, Cardiovascular, Respiratory, Psychiatric, Allergic/Imm, Heme/Lymph Last edited by Theodore Demark, COA on 01/22/2021  8:35 AM.     ALLERGIES Allergies  Allergen Reactions    Sulfonamide Derivatives    Ciprofloxacin Itching and Rash   Lovastatin Itching and Rash   Sulfamethoxazole Itching and Rash   PAST MEDICAL HISTORY History reviewed. No pertinent past medical history. History reviewed. No pertinent surgical history.  FAMILY HISTORY History reviewed. No pertinent family history.  SOCIAL HISTORY Social History   Tobacco Use   Smoking status: Never   Smokeless tobacco: Never  Vaping Use   Vaping Use: Never used  Substance Use Topics   Alcohol use: Never   Drug use: Never       OPHTHALMIC EXAM:  Base Eye Exam     Visual Acuity (Snellen - Linear)       Right Left   Dist cc 20/25 +2 20/30 +1   Dist ph cc NI NI    Correction: Glasses         Tonometry (Tonopen, 8:32 AM)       Right Left   Pressure 13 11         Pupils       Dark Light Shape React APD   Right 4 3 Round Brisk None   Left 4 3 Round Brisk None         Visual Fields (Counting fingers)       Left Right    Full Full         Extraocular Movement       Right Left    Full, Ortho Full, Ortho  Neuro/Psych     Oriented x3: Yes   Mood/Affect: Normal         Dilation     Both eyes: 1.0% Mydriacyl, 2.5% Phenylephrine @ 8:32 AM           Slit Lamp and Fundus Exam     Slit Lamp Exam       Right Left   Lids/Lashes Dermatochalasis - upper lid, mild MGD Dermatochalasis - upper lid, mild MGD   Conjunctiva/Sclera White and quiet White and quiet   Cornea arcus, 1-2+Punctate epithelial erosions, well healed cataract wound arcus, 2+fine Punctate epithelial erosions   Anterior Chamber Deep and quiet Deep and quiet   Iris Round and dilated Round and dilated   Lens PC IOL in good position with open PC PC IOL in good position with open PC -- small opening   Anterior Vitreous Vitreous syneresis Vitreous syneresis, Posterior vitreous detachment         Fundus Exam       Right Left   Disc Pink and Sharp, +cupping Pink and Sharp   C/D Ratio  0.65 0.6   Macula Flat, Blunted foveal reflex, Drusen, RPE mottling and clumping, No heme or edema Flat, Blunted foveal reflex, Drusen, RPE mottling and clumping, central small PED -- stable from prior   Vessels attenuated, Tortuous attenuated, Tortuous   Periphery Attached, no heme, mild reticular degeneration Attached, mild reticular degeneration, No heme           Refraction     Wearing Rx       Sphere Cylinder Axis   Right -1.00 +0.50 085   Left -0.50 +1.00 105            IMAGING AND PROCEDURES  Imaging and Procedures for 01/22/2021  OCT, Retina - OU - Both Eyes       Right Eye Quality was good. Central Foveal Thickness: 246. Progression has been stable. Findings include normal foveal contour, no IRF, no SRF, retinal drusen .   Left Eye Quality was good. Central Foveal Thickness: 258. Progression has been stable. Findings include normal foveal contour, no IRF, no SRF, retinal drusen , pigment epithelial detachment, outer retinal atrophy.   Notes *Images captured and stored on drive  Diagnosis / Impression:  Non-exu ARMD OU  OS: mild central PEDs -- no significant change from prior  Clinical management:  See below  Abbreviations: NFP - Normal foveal profile. CME - cystoid macular edema. PED - pigment epithelial detachment. IRF - intraretinal fluid. SRF - subretinal fluid. EZ - ellipsoid zone. ERM - epiretinal membrane. ORA - outer retinal atrophy. ORT - outer retinal tubulation. SRHM - subretinal hyper-reflective material. IRHM - intraretinal hyper-reflective material            ASSESSMENT/PLAN:    ICD-10-CM   1. Intermediate stage nonexudative age-related macular degeneration of both eyes  H35.3132     2. Retinal edema  H35.81 OCT, Retina - OU - Both Eyes    3. Essential hypertension  I10     4. Hypertensive retinopathy of both eyes  H35.033     5. Pseudophakia of both eyes  Z96.1      1,2. Age related macular degeneration, non-exudative, OU  -  intermediate stage -- stable  - The incidence, anatomy, and pathology of dry AMD, risk of progression, and the AREDS and AREDS 2 studies including smoking risks discussed with patient.  - Continue amsler grid monitoring  - f/u 6 months, OCT, DFE  3,4. Hypertensive  retinopathy OU - discussed importance of tight BP control - monitor  5. Pseudophakia OU  - s/p CE/IOL OU (Dr. Adora Fridge, [place])  - IOLs in good position, doing well  - monitor   Ophthalmic Meds Ordered this visit:  No orders of the defined types were placed in this encounter.    Return in about 6 months (around 07/23/2021) for f/u non-exu ARMD OU, DFE, OCT.  This document serves as a record of services personally performed by Gardiner Sleeper, MD, PhD. It was created on their behalf by San Jetty. Owens Shark, OA an ophthalmic technician. The creation of this record is the provider's dictation and/or activities during the visit.    Electronically signed by: San Jetty. Owens Shark, [place] 11.28.2022 11:02 AM  Gardiner Sleeper, M.D., Ph.D. Diseases & Surgery of the Retina and Vitreous [facility]   I have reviewed the above documentation for accuracy and completeness, and I agree with the above. Gardiner Sleeper, M.D., Ph.D. 01/22/21 11:02 AM   Abbreviations: M myopia (nearsighted); A astigmatism; H hyperopia (farsighted); P presbyopia; Mrx spectacle prescription;  CTL contact lenses; OD right eye; OS left eye; OU both eyes  XT exotropia; ET esotropia; PEK punctate epithelial keratitis; PEE punctate epithelial erosions; DES dry eye syndrome; MGD meibomian gland dysfunction; ATs artificial tears; PFAT's preservative free artificial tears; [place] nuclear sclerotic cataract; PSC posterior subcapsular cataract; ERM epi-retinal membrane; PVD posterior vitreous detachment; RD retinal detachment; DM diabetes mellitus; DR diabetic retinopathy; NPDR non-proliferative diabetic retinopathy; PDR proliferative diabetic retinopathy;  CSME clinically significant macular edema; DME diabetic macular edema; dbh dot blot hemorrhages; CWS cotton wool spot; POAG primary open angle glaucoma; C/D cup-to-disc ratio; HVF humphrey visual field; GVF goldmann visual field; OCT optical coherence tomography; IOP intraocular pressure; BRVO Branch retinal vein occlusion; CRVO central retinal vein occlusion; CRAO central retinal artery occlusion; BRAO branch retinal artery occlusion; RT retinal tear; SB scleral buckle; PPV pars plana vitrectomy; VH Vitreous hemorrhage; PRP panretinal laser photocoagulation; IVK intravitreal kenalog; VMT vitreomacular traction; MH Macular hole;  NVD neovascularization of the disc; NVE neovascularization elsewhere; AREDS age related eye disease study; ARMD age related macular degeneration; POAG primary open angle glaucoma; EBMD epithelial/anterior basement membrane dystrophy; ACIOL anterior chamber intraocular lens; IOL intraocular lens; PCIOL posterior chamber intraocular lens; Phaco/IOL phacoemulsification with intraocular lens placement; [place] photorefractive keratectomy; LASIK laser assisted in situ keratomileusis; HTN hypertension; DM diabetes mellitus; COPD chronic obstructive pulmonary disease

## 2021-01-22 ENCOUNTER — Encounter (INDEPENDENT_AMBULATORY_CARE_PROVIDER_SITE_OTHER): Payer: Self-pay | Admitting: Ophthalmology

## 2021-01-22 ENCOUNTER — Ambulatory Visit (INDEPENDENT_AMBULATORY_CARE_PROVIDER_SITE_OTHER): Payer: Medicare HMO | Admitting: Ophthalmology

## 2021-01-22 ENCOUNTER — Other Ambulatory Visit: Payer: Self-pay

## 2021-01-22 DIAGNOSIS — H3581 Retinal edema: Secondary | ICD-10-CM

## 2021-01-22 DIAGNOSIS — I10 Essential (primary) hypertension: Secondary | ICD-10-CM

## 2021-01-22 DIAGNOSIS — H35033 Hypertensive retinopathy, bilateral: Secondary | ICD-10-CM | POA: Diagnosis not present

## 2021-01-22 DIAGNOSIS — H353132 Nonexudative age-related macular degeneration, bilateral, intermediate dry stage: Secondary | ICD-10-CM

## 2021-01-22 DIAGNOSIS — Z961 Presence of intraocular lens: Secondary | ICD-10-CM | POA: Diagnosis not present

## 2021-03-03 ENCOUNTER — Encounter (HOSPITAL_BASED_OUTPATIENT_CLINIC_OR_DEPARTMENT_OTHER): Payer: Medicare HMO | Attending: Internal Medicine | Admitting: Internal Medicine

## 2021-03-03 ENCOUNTER — Other Ambulatory Visit: Payer: Self-pay

## 2021-03-03 DIAGNOSIS — L97228 Non-pressure chronic ulcer of left calf with other specified severity: Secondary | ICD-10-CM | POA: Insufficient documentation

## 2021-03-03 DIAGNOSIS — T8131XD Disruption of external operation (surgical) wound, not elsewhere classified, subsequent encounter: Secondary | ICD-10-CM | POA: Insufficient documentation

## 2021-03-03 DIAGNOSIS — L942 Calcinosis cutis: Secondary | ICD-10-CM | POA: Insufficient documentation

## 2021-03-03 DIAGNOSIS — X58XXXD Exposure to other specified factors, subsequent encounter: Secondary | ICD-10-CM | POA: Diagnosis not present

## 2021-03-03 NOTE — Progress Notes (Signed)
HURLEY, BLEVINS ([id number]) Visit Report for 03/03/2021 Abuse/Suicide Risk Screen Details Patient Name: Date of Service: [name], [name] 03/03/2021 1:45 PM Medical Record Number: [id number] Patient Account Number: [id number] Date of Birth/Sex: Treating RN: 1935-07-11 (86 y.o. [name] Primary Care Jacon Whetzel: [place] RRISO Birder Robson Other Clinician: Referring Izaha Shughart: Treating Karla Pavone/Extender: Linton Ham [place] RRISO N, HA YDEN Weeks in Treatment: 0 Abuse/Suicide Risk Screen Items Answer ABUSE RISK SCREEN: Has anyone close to you tried to hurt or harm you recentlyo No Do you feel uncomfortable with anyone in your familyo No Has anyone forced you do things that you didnt want to doo No Electronic Signature(s) Signed: 03/03/2021 4:43:10 PM By: Lorrin Jackson Entered By: Lorrin Jackson on 03/03/2021 13:40:43 -------------------------------------------------------------------------------- Activities of Daily Living Details Patient Name: Date of Service: [name], [name] 03/03/2021 1:45 PM Medical Record Number: [id number] Patient Account Number: [id number] Date of Birth/Sex: Treating RN: 11/11/1935 (86 y.o. [name] Primary Care Daryn Hicks: [place] RRISO Birder Robson Other Clinician: Referring Joeann Steppe: Treating Khadim Lundberg/Extender: Linton Ham [place] RRISO N, HA YDEN Weeks in Treatment: 0 Activities of Daily Living Items Answer Activities of Daily Living (Please select one for each item) Drive Automobile Completely Able T Medications ake Completely Able Use T elephone Completely Able Care for Appearance Completely Able Use T oilet Completely Able Bath / Shower Completely Able Dress Self Completely Able Feed Self Completely Able Walk Completely Able Get In / Out Bed Completely Able Housework Completely Able Prepare Meals Completely [place] for Self Completely Able Electronic Signature(s) Signed: 03/03/2021 4:43:10 PM By: Lorrin Jackson Entered By: Lorrin Jackson on 03/03/2021 13:41:14 -------------------------------------------------------------------------------- Education Screening Details Patient Name: Date of Service: Diane Ano. 03/03/2021 1:45 PM Medical Record Number: [id number] Patient Account Number: [id number] Date of Birth/Sex: Treating RN: 1935-09-21 (86 y.o. [name] Primary Care Kassi Esteve: [place] RRISO Birder Robson Other Clinician: Referring Griffon Herberg: Treating Jessie Schrieber/Extender: Linton Ham [place] RRISO Delane Ginger, [place] Maylene Roes in Treatment: 0 Primary Learner Assessed: Patient Learning Preferences/Education Level/Primary Language Learning Preference: Explanation, Demonstration, Printed Material Highest Education Level: High School Preferred Language: English Cognitive Barrier Language Barrier: No Translator Needed: No Memory Deficit: No Emotional Barrier: No Cultural/Religious Beliefs Affecting Medical Care: No Physical Barrier Impaired Vision: Yes Glasses Impaired Hearing: No Decreased Hand dexterity: No Knowledge/Comprehension Knowledge Level: High Comprehension Level: High Ability to understand written instructions: High Ability to understand verbal instructions: High Motivation Anxiety Level: Calm Cooperation: Cooperative Education Importance: Acknowledges Need Interest in Health Problems: Asks Questions Perception: Coherent Willingness to Engage in Self-Management High Activities: Readiness to Engage in Self-Management High Activities: Electronic Signature(s) Signed: 03/03/2021 4:43:10 PM By: Lorrin Jackson Entered By: Lorrin Jackson on 03/03/2021 13:41:50 -------------------------------------------------------------------------------- Fall Risk Assessment Details Patient Name: Date of Service: Diane Ano. 03/03/2021 1:45 PM Medical Record Number: [id number] Patient Account Number: [id number] Date of Birth/Sex: Treating RN: 28-Jun-1935 (86 y.o. [name] Primary Care Wojciech Willetts: [place] RRISO Birder Robson Other Clinician: Referring Keiffer Piper: Treating Fergie Sherbert/Extender: Linton Ham [place] RRISO Delane Ginger, [place] Maylene Roes in Treatment: 0 Fall Risk Assessment Items Have you had 2 or more falls in the last 12 monthso 0 Yes Have you had any fall that resulted in injury in the last 12 monthso 0 No FALLS RISK SCREEN History of falling - immediate or within 3 months 25 Yes Secondary diagnosis (Do you have 2 or more medical diagnoseso) 0 No Ambulatory aid None/bed rest/wheelchair/nurse 0 Yes Crutches/cane/walker 0 No Furniture 0 No Intravenous therapy  Access/Saline/Heparin Lock 0 No Gait/Transferring Normal/ bed rest/ wheelchair 0 Yes Weak (short steps with or without shuffle, stooped but able to lift head while walking, may seek 0 No support from furniture) Impaired (short steps with shuffle, may have difficulty arising from chair, head down, impaired 0 No balance) Mental Status Oriented to own ability 0 Yes Electronic Signature(s) Signed: 03/03/2021 4:43:10 PM By: Lorrin Jackson Entered By: Lorrin Jackson on 03/03/2021 13:42:22 -------------------------------------------------------------------------------- Foot Assessment Details Patient Name: Date of Service: Diane Ano. 03/03/2021 1:45 PM Medical Record Number: [id number] Patient Account Number: [id number] Date of Birth/Sex: Treating RN: [birth year] (86 y.o. [name] Primary Care Semaya Vida: [place] RRISO Birder Robson Other Clinician: Referring Keyonni Percival: Treating Jasmane Brockway/Extender: Linton Ham [place] RRISO N, HA YDEN Weeks in Treatment: 0 Foot Assessment Items Site Locations + = Sensation present, - = Sensation absent, C = Callus, U = Ulcer R = Redness, W = Warmth, M = Maceration, PU = Pre-ulcerative lesion F = Fissure, S = Swelling, D = Dryness Assessment Right: Left: Other Deformity: No No Prior Foot Ulcer: No No Prior Amputation: No No Charcot Joint: No No Ambulatory Status:  Ambulatory Without Help Gait: Steady Electronic Signature(s) Signed: 03/03/2021 4:43:10 PM By: Lorrin Jackson Entered By: Lorrin Jackson on 03/03/2021 13:51:19 -------------------------------------------------------------------------------- Nutrition Risk Screening Details Patient Name: Date of Service: [name], [name] 03/03/2021 1:45 PM Medical Record Number: [id number] Patient Account Number: [id number] Date of Birth/Sex: Treating RN: 20-Mar-1935 (86 y.o. [name] Primary Care Lelan Cush: [place] RRISO Birder Robson Other Clinician: Referring Kaynan Klonowski: Treating Zyion Doxtater/Extender: Linton Ham [place] RRISO N, HA YDEN Weeks in Treatment: 0 Height (in): Weight (lbs): Body Mass Index (BMI): Nutrition Risk Screening Items Score Screening NUTRITION RISK SCREEN: I have an illness or condition that made me change the kind and/or amount of food I eat 0 No I eat fewer than two meals per day 0 No I eat few fruits and vegetables, or milk products 0 No I have three or more drinks of beer, liquor or wine almost every day 0 No I have tooth or mouth problems that make it hard for me to eat 0 No I don't always have enough money to buy the food I need 0 No I eat alone most of the time 0 No I take three or more different prescribed or over-the-counter drugs a day 1 Yes Without wanting to, I have lost or gained 10 pounds in the last six months 0 No I am not always physically able to shop, cook and/or feed myself 0 No Nutrition Protocols Good Risk Protocol 0 No interventions needed Moderate Risk Protocol High Risk Proctocol Risk Level: Good Risk Score: 1 Electronic Signature(s) Signed: 03/03/2021 4:43:10 PM By: Lorrin Jackson Entered By: Lorrin Jackson on 03/03/2021 13:42:38

## 2021-03-03 NOTE — Progress Notes (Signed)
[name], [name] ([id number]) Visit Report for 03/03/2021 Chief Complaint Document Details Patient Name: Date of Service: [name], [name] 03/03/2021 1:45 PM Medical Record Number: [id number] Patient Account Number: [id number] Date of Birth/Sex: Treating RN: [birth year] (86 y.o. [name] Primary Care Provider: MO Monica Becton Other Clinician: Referring Provider: Treating Provider/Extender: Linton Ham MO RRISO Delane Ginger, HA Maylene Roes in Treatment: 0 Information Obtained from: Patient Chief Complaint 03/03/2021; patient is here for review of a wound on the left medial calf which was the site of biopsy done in September 22 Electronic Signature(s) Signed: 03/03/2021 4:28:08 PM By: Linton Ham MD Entered By: Linton Ham on 03/03/2021 15:03:45 -------------------------------------------------------------------------------- Debridement Details Patient Name: Date of Service: Diane Ano. 03/03/2021 1:45 PM Medical Record Number: [id number] Patient Account Number: [id number] Date of Birth/Sex: Treating RN: 31-May-1935 (86 y.o. [name] Primary Care Provider: MO Monica Becton Other Clinician: Referring Provider: Treating Provider/Extender: Linton Ham MO RRISO Delane Ginger, HA Maylene Roes in Treatment: 0 Debridement Performed for Assessment: Wound #1 Left,Anterior Lower Leg Performed By: Physician Ricard Dillon., MD Debridement Type: Debridement Level of Consciousness (Pre-procedure): Awake and Alert Pre-procedure Verification/Time Out Yes - 14:31 Taken: Start Time: 14:32 Pain Control: Other : Debridement T Area Debrided (L x W): otal 1.4 (cm) x 1.1 (cm) = 1.54 (cm) Tissue and other material debrided: Non-Viable, Subcutaneous, Other: Calcium Deposits Level: Skin/Subcutaneous Tissue Debridement Description: Excisional Instrument: Curette, Forceps Specimen: Swab, Number of Specimens T aken: 1 Bleeding: Minimum Hemostasis Achieved: Pressure End Time:  14:38 Response to Treatment: Procedure was tolerated well Level of Consciousness (Post- Awake and Alert procedure): Post Debridement Measurements of Total Wound Length: (cm) 1.4 Width: (cm) 1.1 Depth: (cm) 0.3 Volume: (cm) 0.363 Character of Wound/Ulcer Post Debridement: Stable Post Procedure Diagnosis Same as Pre-procedure Electronic Signature(s) Signed: 03/03/2021 4:28:08 PM By: Linton Ham MD Signed: 03/03/2021 5:00:37 PM By: Levan Hurst RN, BSN Entered By: Linton Ham on 03/03/2021 15:02:03 -------------------------------------------------------------------------------- HPI Details Patient Name: Date of Service: Diane Ano. 03/03/2021 1:45 PM Medical Record Number: [id number] Patient Account Number: [id number] Date of Birth/Sex: Treating RN: [birth year] (86 y.o. [name] Primary Care Provider: MO Monica Becton Other Clinician: Referring Provider: Treating Provider/Extender: Linton Ham MO RRISO Delane Ginger, HA Maylene Roes in Treatment: 0 History of Present Illness HPI Description: ADMISSION 03/03/2021 This is a 86 year old woman who tells me she fell 13 years ago. She had lumps on her legs since then. There was never an open wound at the time. These "lumps" were hard but nonpainful. She was seen by dermatology at [facility] dermatology who did she Dr. Martin Majestic in September who performed a punch biopsy on 1 of these. We do not have these results. The area was sutured and then sometime later the sutures were removed and the wound dehisced. She developed redness and swelling in the area and received a course of Vantin as well as prior courses of Augmentin and doxycycline. A culture done at the end of December showed Corynebacterium which is a common skin contaminant and unlikely to have been a true pathogen. She is here for review of this. She has been using Silvadene cream Past medical history includes migraines hypertension arthritis she is a prediabetic with a  last hemoglobin of 6 apparently to have bladder surgery early in February ABI in the left leg was noncompressible Electronic Signature(s) Signed: 03/03/2021 4:28:08 PM By: Linton Ham MD Entered By: Linton Ham on 03/03/2021 15:06:59 -------------------------------------------------------------------------------- Physical Exam Details Patient Name: Date  of Service: [name], [name] 03/03/2021 1:45 PM Medical Record Number: [id number] Patient Account Number: [id number] Date of Birth/Sex: Treating RN: 1935/03/30 (86 y.o. [name] Primary Care Provider: MO Monica Becton Other Clinician: Referring Provider: Treating Provider/Extender: Linton Ham MO RRISO N, HA YDEN Weeks in Treatment: 0 Constitutional Patient is hypertensive.. Pulse regular and within target range for patient.Marland Kitchen Respirations regular, non-labored and within target range.. Temperature is normal and within the target range for the patient.Marland Kitchen Appears in no distress. Cardiovascular Pedal pulses palpable. Some degree of venous hypertension and secondary lymphedema. Notes Wound exam; left medial calf small punched out area on the left medial calf compatible with a punch biopsy. This tunnels superiorly. There is a fair amount of inflammation around this but no real tenderness or crepitus. I used a #3 curette to remove necrotic subcutaneous tissue and cutaneous calcification Electronic Signature(s) Signed: 03/03/2021 4:28:08 PM By: Linton Ham MD Entered By: Linton Ham on 03/03/2021 15:13:26 -------------------------------------------------------------------------------- Physician Orders Details Patient Name: Date of Service: Diane Ano. 03/03/2021 1:45 PM Medical Record Number: [id number] Patient Account Number: [id number] Date of Birth/Sex: Treating RN: 1935/12/31 (86 y.o. [name] Primary Care Provider: MO RRISO Birder Zannie Locastro Other Clinician: Referring Provider: Treating  Provider/Extender: Linton Ham MO RRISO N, HA YDEN Weeks in Treatment: 0 Verbal / Phone Orders: No Diagnosis Coding Follow-up Appointments ppointment in 1 week. - with Dr. Dellia Nims Return A Bathing/ Shower/ Hygiene May shower with protection but do not get wound dressing(s) wet. - May use cast protector bag. Can get at [facility] or [facility] Edema Control - Lymphedema / SCD / Other Elevate legs to the level of the heart or above for 30 minutes daily and/or when sitting, a frequency of: Avoid standing for long periods of time. Additional Orders / Instructions Follow Nutritious Diet Wound Treatment Wound #1 - Lower Leg Wound Laterality: Left, Anterior Cleanser: Soap and Water 1 x Per Week/30 Days Discharge Instructions: May shower and wash wound with dial antibacterial soap and water prior to dressing change. Cleanser: Wound Cleanser 1 x Per Week/30 Days Discharge Instructions: Cleanse the wound with wound cleanser prior to applying a clean dressing using gauze sponges, not tissue or cotton balls. Peri-Wound Care: Triamcinolone 15 (g) 1 x Per Week/30 Days Discharge Instructions: Use triamcinolone 15 (g) as directed Peri-Wound Care: Sween Lotion (Moisturizing lotion) 1 x Per Week/30 Days Discharge Instructions: Apply moisturizing lotion as directed Prim Dressing: Promogran Prisma Matrix, 4.34 (sq in) (silver collagen) 1 x Per Week/30 Days ary Discharge Instructions: Moisten collagen with saline or hydrogel Secondary Dressing: ABD Pad, 5x9 1 x Per Week/30 Days Discharge Instructions: Apply over primary dressing as directed. Compression Wrap: ThreePress (3 layer compression wrap) 1 x Per Week/30 Days Discharge Instructions: Apply three layer compression as directed. Laboratory erobe culture (MICRO) - PCR Culture sent to [facility] Bacteria identified in Unspecified specimen by A LOINC Code: 845-3 Convenience Name: Areobic culture-specimen not specified Electronic Signature(s) Signed:  03/03/2021 4:28:08 PM By: Linton Ham MD Signed: 03/03/2021 4:43:10 PM By: Lorrin Jackson Entered By: Lorrin Jackson on 03/03/2021 14:44:25 -------------------------------------------------------------------------------- Problem List Details Patient Name: Date of Service: Diane Ano. 03/03/2021 1:45 PM Medical Record Number: [id number] Patient Account Number: [id number] Date of Birth/Sex: Treating RN: 1935-12-22 (86 y.o. 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 03/03/2021 1:45 PM Medical Record Number: [id number] Patient Account Number: [id number] Date of Birth/Sex: Treating RN: May 16, 1935 (86 y.o. [name] Primary Care Provider: MO Monica Becton Other Clinician: Referring Provider: Treating Provider/Extender: Linton Ham MO RRISO Delane Ginger, HA Maylene Roes in Treatment: 0 Subjective Chief Complaint Information obtained from Patient 03/03/2021; patient is here for review of a wound on the left medial calf which was the site of biopsy done in September 22 History of Present Illness (HPI) ADMISSION 03/03/2021 This is a 86 year old woman who tells me she fell 13 years ago. She had  lumps on her legs since then. There was never an open wound at the time. These "lumps" were hard but nonpainful. She was seen by dermatology at [facility] dermatology who did she Dr. Martin Majestic in September who performed a punch biopsy on 1 of these. We do not have these results. The area was sutured and then sometime later the sutures were removed and the wound dehisced. She developed redness and swelling in the area and received a course of Vantin as well as prior courses of Augmentin and doxycycline. A culture done at the end of December showed Corynebacterium which is a common skin contaminant and unlikely to have been a true pathogen. She is here for review of this. She has been using Silvadene cream Past medical history includes migraines hypertension arthritis she is a prediabetic with a last hemoglobin of 6 apparently to have bladder surgery early in February ABI in the left leg was noncompressible Patient History Information obtained from Patient. Allergies Sulfa (Sulfonamide Antibiotics), ciprofloxacin, lovastatin, sulfamethoxazole (Reaction: itching), Mevacor (Reaction: itching) Family History Cancer - Child, Diabetes - Child,Father, Heart Disease - Mother, Hypertension - Mother, No family history of Hereditary Spherocytosis, Kidney Disease, Lung Disease, Seizures, Stroke, Thyroid Problems, Tuberculosis. Social History Never smoker, Marital Status - Widowed, Alcohol Use - Never, Drug Use - No History, Caffeine Use - Daily - coffee. Medical History Eyes Patient has history of Cataracts - Removed Cardiovascular Patient has history of Hypertension Musculoskeletal Patient has history of Osteoarthritis Medical A Surgical History Notes nd Eyes Macular Degeneration Musculoskeletal Bilateral Knee Replacements Neurologic Migraines Review of Systems (ROS) Eyes Complains or has symptoms of Glasses / Contacts. Ear/Nose/Mouth/Throat Denies complaints or symptoms of Chronic sinus  problems or rhinitis. Respiratory Denies complaints or symptoms of Chronic or frequent coughs, Shortness of Breath. Gastrointestinal Denies complaints or symptoms of Frequent diarrhea, Nausea, Vomiting. Endocrine Denies complaints or symptoms of Heat/cold intolerance. Genitourinary Denies complaints or symptoms of Frequent urination. Psychiatric Denies complaints or symptoms of Claustrophobia, Suicidal. Objective Constitutional Patient is hypertensive.. Pulse regular and within target range for patient.Marland Kitchen Respirations regular, non-labored and within target range.. Temperature is normal and within the target range for the patient.Marland Kitchen Appears in no distress. Vitals Time Taken: 1:35 PM, Temperature: 98.4 F, Pulse: 76 bpm, Respiratory Rate: 18 breaths/min, Blood Pressure: 190/83 mmHg. Cardiovascular Pedal pulses palpable. Some degree of venous hypertension and secondary lymphedema. General Notes: Wound exam; left medial calf small punched out area on the left medial calf compatible with a punch biopsy. This tunnels superiorly. There is a fair  amount of inflammation around this but no real tenderness or crepitus. I used a #3 curette to remove necrotic subcutaneous tissue and cutaneous calcification Integumentary (Hair, Skin) Wound #1 status is Open. Original cause of wound was Surgical Injury. The date acquired was: 11/04/2020. The wound is located on the Left,Anterior Lower Leg. The wound measures 1.4cm length x 1.1cm width x 0.3cm depth; 1.21cm^2 area and 0.363cm^3 volume. There is Fat Layer (Subcutaneous Tissue) exposed. There is tunneling at 10:00 with a maximum distance of 0.5cm. There is a medium amount of purulent drainage noted. The wound margin is well defined and not attached to the wound base. There is large (67-100%) red granulation within the wound bed. There is a small (1-33%) amount of necrotic tissue within the wound bed including Adherent Slough. 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 There was a Excisional Skin/Subcutaneous Tissue Debridement with a total area of 1.54 sq cm performed by Ricard Dillon., MD. With the following instrument(s): Curette, and Forceps to remove Non-Viable tissue/material. Material removed includes Subcutaneous Tissue and Other: Calcium Deposits after achieving pain control using Other (Debridement). 1 specimen was taken by a Swab and sent to the lab per facility protocol. A time out was conducted at 14:31, prior to the start of the procedure. A Minimum amount of bleeding was controlled with Pressure. The procedure was tolerated well. Post Debridement Measurements: 1.4cm length x 1.1cm width x 0.3cm depth; 0.363cm^3 volume. Character of Wound/Ulcer Post Debridement is stable. Post procedure Diagnosis Wound #1: Same as Pre-Procedure Plan Follow-up Appointments: Return Appointment in 1 week. - with Dr. Arcola Jansky Shower/ Hygiene: May shower with protection but do not get wound dressing(s) wet. - May use cast protector bag. Can get at [facility] or [facility] Edema Control - Lymphedema / SCD / Other: Elevate legs to the level of the heart or above for 30 minutes daily and/or when sitting, a frequency of: Avoid standing for long periods of time. Additional Orders / Instructions: Follow Nutritious Diet Laboratory ordered were: Areobic culture-specimen not specified - PCR Culture sent to [facility] WOUND #1: - Lower Leg Wound Laterality: Left, Anterior Cleanser: Soap and Water 1 x Per Week/30 Days Discharge Instructions: May shower and wash wound with dial antibacterial soap and water  prior to dressing change. Cleanser: Wound Cleanser 1 x Per Week/30 Days Discharge Instructions: Cleanse the wound with wound cleanser prior to applying a clean dressing using gauze sponges, not tissue or cotton balls. Peri-Wound Care: Triamcinolone 15 (g) 1 x Per Week/30 Days Discharge Instructions: Use triamcinolone 15 (g) as directed Peri-Wound Care: Sween Lotion (Moisturizing lotion) 1 x Per Week/30 Days Discharge Instructions: Apply moisturizing lotion as directed Prim Dressing: Promogran Prisma Matrix, 4.34 (sq in) (silver collagen) 1 x Per Week/30 Days ary Discharge Instructions: Moisten collagen with saline or hydrogel Secondary Dressing: ABD Pad, 5x9 1 x Per Week/30 Days Discharge Instructions: Apply over primary dressing as directed. Com pression Wrap: ThreePress (3 layer compression wrap) 1 x Per Week/30 Days Discharge Instructions: Apply three layer compression as directed. 1. Likely long-term traumatic area with subcutaneous calcification now status post nonhealing biopsy site. 2. Traumatic subcutaneous calcification 3. There is still some purulent/inflammatory drainage which I have done a PCR culture for no  additional antibiotics at this point 4. I elected to use silver collagen in the wound after removing some of the calcification from the base of it. And I put her in 3 layer compression. I asked her to call us if she has any difficulty during weekday hours 5. Depending on the PCR culture I will elect to give her antibiotics Electronic Signature(s) Signed: 03/03/2021 4:28:08 PM By: Linton Ham MD Entered By: Linton Ham on 03/03/2021 15:14:58 -------------------------------------------------------------------------------- HxROS Details Patient Name: Date of Service: Diane Ano. 03/03/2021 1:45 PM Medical Record Number: [id number] Patient Account Number: [id number] Date of Birth/Sex: Treating RN: 1935-03-12 (86 y.o. [name] Primary Care Provider: MO  RRISO Birder Corianne Buccellato Other Clinician: Referring Provider: Treating Provider/Extender: Linton Ham MO RRISO N, HA YDEN Weeks in Treatment: 0 Information Obtained From Patient Eyes Complaints and Symptoms: Positive for: Glasses / Contacts Medical History: Positive for: Cataracts - Removed Past Medical History Notes: Macular Degeneration Ear/Nose/Mouth/Throat Complaints and Symptoms: Negative for: Chronic sinus problems or rhinitis Respiratory Complaints and Symptoms: Negative for: Chronic or frequent coughs; Shortness of Breath Gastrointestinal Complaints and Symptoms: Negative for: Frequent diarrhea; Nausea; Vomiting Endocrine Complaints and Symptoms: Negative for: Heat/cold intolerance Genitourinary Complaints and Symptoms: Negative for: Frequent urination Psychiatric Complaints and Symptoms: Negative for: Claustrophobia; Suicidal Hematologic/Lymphatic Cardiovascular Medical History: Positive for: Hypertension Immunological Integumentary (Skin) Musculoskeletal Medical History: Positive for: Osteoarthritis Past Medical History Notes: Bilateral Knee Replacements Neurologic Medical History: Past Medical History Notes: Migraines Oncologic HBO Extended History Items Eyes: Cataracts Immunizations Pneumococcal Vaccine: Received Pneumococcal Vaccination: No Implantable Devices None Family and Social History Cancer: Yes - Child; Diabetes: Yes - Child,Father; Heart Disease: Yes - Mother; Hereditary Spherocytosis: No; Hypertension: Yes - Mother; Kidney Disease: No; Lung Disease: No; Seizures: No; Stroke: No; Thyroid Problems: No; Tuberculosis: No; Never smoker; Marital Status - Widowed; Alcohol Use: Never; Drug Use: No History; Caffeine Use: Daily - coffee; Financial Concerns: No; Food, Clothing or Shelter Needs: No; Support System Lacking: No; Transportation Concerns: No Electronic Signature(s) Signed: 03/03/2021 4:28:08 PM By: Linton Ham MD Signed: 03/03/2021  4:43:10 PM By: Lorrin Jackson Entered By: Lorrin Jackson on 03/03/2021 13:40:34 -------------------------------------------------------------------------------- SuperBill Details Patient Name: Date of Service: Diane Ano. 03/03/2021 Medical Record Number: [id number] Patient Account Number: [id number] Date of Birth/Sex: Treating RN: 1935-02-26 (86 y.o. 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 Facility Procedures CPT4 Code: [id number] Description: [place] VISIT-LEV 3 EST PT Modifier: 25 Quantity: 1 CPT4 Code: [id number] Description: 46568 - DEB SUBQ TISSUE 20 SQ CM/< ICD-10 Diagnosis Description L97.228 Non-pressure chronic ulcer of left calf with other specified severity Modifier: Quantity: 1 Physician Procedures : CPT4 Code Description Modifier [id number] - WC PHYS LEVEL 3 - EST PT 25 ICD-10 Diagnosis Description T81.31XD Disruption of external operation (surgical) wound, not elsewhere classified, subsequent encounter L97.228 Non-pressure chronic ulcer of  left calf with other specified severity 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

## 2021-03-03 NOTE — Progress Notes (Signed)
[name], [name] ([id number]) Visit Report for 03/03/2021 Allergy List Details Patient Name: Date of Service: [name], [name] 03/03/2021 1:45 PM Medical Record Number: [id number] Patient Account Number: [id number] Date of Birth/Sex: Treating RN: 10/27/1935 (86 y.o. [name] Primary Care Breckin Zafar: [place] Monica Becton Other Clinician: Referring Samba Cumba: Treating Makeshia Seat/Extender: Linton Ham [place] RRISO N, HA YDEN Weeks in Treatment: 0 Allergies Active Allergies Sulfa (Sulfonamide Antibiotics) ciprofloxacin lovastatin sulfamethoxazole Reaction: itching Mevacor Reaction: itching Allergy Notes Electronic Signature(s) Signed: 03/03/2021 4:43:10 PM By: Lorrin Jackson Entered By: Lorrin Jackson on 03/03/2021 13:36:48 -------------------------------------------------------------------------------- Arrival Information Details Patient Name: Date of Service: [name]. 03/03/2021 1:45 PM Medical Record Number: [id number] Patient Account Number: [id number] Date of Birth/Sex: Treating RN: 1935/07/06 (86 y.o. [name] Primary Care Kyrsten Deleeuw: [place] RRISO Birder Robson Other Clinician: Referring Nikya Busler: Treating Shevelle Smither/Extender: Linton Ham [place] RRISO Delane Ginger, HA Maylene Roes in Treatment: 0 Visit Information Patient Arrived: Ambulatory Arrival Time: 13:33 Transfer Assistance: None Patient Identification Verified: Yes Secondary Verification Process Completed: Yes Patient Requires Transmission-Based Precautions: No Patient Has Alerts: Yes Patient Alerts: L ABI: Non Comp Electronic Signature(s) Signed: 03/03/2021 4:43:10 PM By: Lorrin Jackson Entered By: Lorrin Jackson on 03/03/2021 14:04:56 -------------------------------------------------------------------------------- Clinic Level of Care Assessment Details Patient Name: Date of Service: [name], [name] 03/03/2021 1:45 PM Medical Record Number: [id number] Patient Account Number: [id number] Date of Birth/Sex:  Treating RN: 01/01/1936 (86 y.o. [name] Primary Care Calliope Delangel: [place] RRISO Birder Robson Other Clinician: Referring Naina Sleeper: Treating Rubin Dais/Extender: Linton Ham [place] RRISO N, HA YDEN Weeks in Treatment: 0 Clinic Level of Care Assessment Items TOOL 1 Quantity Score X- 1 0 Use when EandM and Procedure is performed on INITIAL visit ASSESSMENTS - Nursing Assessment / Reassessment X- 1 20 General Physical Exam (combine w/ comprehensive assessment (listed just below) when performed on new pt. evals) X- 1 25 Comprehensive Assessment (HX, ROS, Risk Assessments, Wounds Hx, etc.) ASSESSMENTS - Wound and Skin Assessment / Reassessment []  - 0 Dermatologic / Skin Assessment (not related to wound area) ASSESSMENTS - Ostomy and/or Continence Assessment and Care []  - 0 Incontinence Assessment and Management []  - 0 Ostomy Care Assessment and Management (repouching, etc.) PROCESS - Coordination of Care []  - 0 Simple Patient / Family Education for ongoing care X- 1 20 Complex (extensive) Patient / Family Education for ongoing care X- 1 10 Staff obtains Programmer, systems, Records, T Results / Process Orders est X- 1 10 Staff telephones HHA, Nursing Homes / Clarify orders / etc []  - 0 Routine Transfer to another Facility (non-emergent condition) []  - 0 Routine Hospital Admission (non-emergent condition) []  - 0 New Admissions / Biomedical engineer / Ordering NPWT Apligraf, etc. , []  - 0 Emergency Hospital Admission (emergent condition) PROCESS - Special Needs []  - 0 Pediatric / Minor Patient Management []  - 0 Isolation Patient Management []  - 0 Hearing / Language / Visual special needs []  - 0 Assessment of Community assistance (transportation, D/C planning, etc.) []  - 0 Additional assistance / Altered mentation []  - 0 Support Surface(s) Assessment (bed, cushion, seat, etc.) INTERVENTIONS - Miscellaneous []  - 0 External ear exam []  - 0 Patient Transfer (multiple staff /  Civil Service fast streamer / Similar devices) []  - 0 Simple Staple / Suture removal (25 or less) []  - 0 Complex Staple / Suture removal (26 or more) []  - 0 Hypo/Hyperglycemic Management (do not check if billed separately) X- 1 15 Ankle / Brachial Index (ABI) - do not check if billed separately Has the  patient been seen at the hospital within the last three years: Yes Total Score: 100 Level Of Care: New/Established - Level 3 Electronic Signature(s) Signed: 03/03/2021 4:43:10 PM By: Lorrin Jackson Entered By: Lorrin Jackson on 03/03/2021 14:45:19 -------------------------------------------------------------------------------- Encounter Discharge Information Details Patient Name: Date of Service: [name]. 03/03/2021 1:45 PM Medical Record Number: [id number] Patient Account Number: [id number] Date of Birth/Sex: Treating RN: 30-Dec-1935 (86 y.o. [name] Primary Care Oleda Borski: [place] RRISO Birder Robson Other Clinician: Referring Florinda Taflinger: Treating Corrie Brannen/Extender: Linton Ham [place] RRISO N, HA Maylene Roes in Treatment: 0 Encounter Discharge Information Items Post Procedure Vitals Discharge Condition: Stable Temperature (F): 98.4 Ambulatory Status: Ambulatory Pulse (bpm): 76 Discharge Destination: Home Respiratory Rate (breaths/min): 18 Transportation: Private Auto Blood Pressure (mmHg): 190/83 Schedule Follow-up Appointment: Yes Clinical Summary of Care: Provided on 03/03/2021 Form Type Recipient Paper Patient Patient Electronic Signature(s) Signed: 03/03/2021 4:43:10 PM By: Lorrin Jackson Entered By: Lorrin Jackson on 03/03/2021 14:46:59 -------------------------------------------------------------------------------- Lower Extremity Assessment Details Patient Name: Date of Service: [name], [name] 03/03/2021 1:45 PM Medical Record Number: [id number] Patient Account Number: [id number] Date of Birth/Sex: Treating RN: [birth year] (86 y.o. [name] Primary Care Adilynn Bessey:  [place] RRISO Birder Robson Other Clinician: Referring Bell Carbo: Treating Allyn Bartelson/Extender: Linton Ham [place] RRISO N, HA YDEN Weeks in Treatment: 0 Edema Assessment Assessed: [Left: Yes] [Right: No] Edema: [Left: Ye] [Right: s] Calf Left: Right: Point of Measurement: 28 cm From Medial Instep 39.5 cm Ankle Left: Right: Point of Measurement: 8 cm From Medial Instep 22.6 cm Knee To Floor Left: Right: From Medial Instep 35 cm Vascular Assessment Pulses: Dorsalis Pedis Palpable: [Left:Yes] Notes Left Leg ABI: Non-Compressible Electronic Signature(s) Signed: 03/03/2021 4:43:10 PM By: Lorrin Jackson Entered By: Lorrin Jackson on 03/03/2021 14:04:35 -------------------------------------------------------------------------------- Multi Wound Chart Details Patient Name: Date of Service: [name]. 03/03/2021 1:45 PM Medical Record Number: [id number] Patient Account Number: [id number] Date of Birth/Sex: Treating RN: Apr 02, 1935 (86 y.o. [name] Primary Care Luie Laneve: [place] Monica Becton Other Clinician: Referring Yaminah Clayborn: Treating Rahm Minix/Extender: Linton Ham [place] RRISO N, HA YDEN Weeks in Treatment: 0 Vital Signs Height(in): Pulse(bpm): 49 Weight(lbs): Blood Pressure(mmHg): 190/83 Body Mass Index(BMI): Temperature(F): 98.4 Respiratory Rate(breaths/min): 18 Photos: [N/A:N/A] Left, Anterior Lower Leg N/A N/A Wound Location: Surgical Injury N/A N/A Wounding Event: Infection - not elsewhere classified N/A N/A Primary Etiology: Cataracts, Hypertension, N/A N/A Comorbid History: Osteoarthritis 11/04/2020 N/A N/A Date Acquired: 0 N/A N/A Weeks of Treatment: Open N/A N/A Wound Status: 1.4x1.1x0.3 N/A N/A Measurements L x W x D (cm) 1.21 N/A N/A A (cm) : rea 0.363 N/A N/A Volume (cm) : 0.00% N/A N/A % Reduction in A rea: 0.00% N/A N/A % Reduction in Volume: 10 Position 1 (o'clock): 0.5 Maximum Distance 1 (cm): Yes N/A N/A Tunneling: Full  Thickness Without Exposed N/A N/A Classification: Support Structures Medium N/A N/A Exudate A mount: Purulent N/A N/A Exudate Type: yellow, brown, green N/A N/A Exudate Color: Well defined, not attached N/A N/A Wound Margin: Large (67-100%) N/A N/A Granulation A mount: Red N/A N/A Granulation Quality: Small (1-33%) N/A N/A Necrotic A mount: Fat Layer (Subcutaneous Tissue): Yes N/A N/A Exposed Structures: Fascia: No Tendon: No Muscle: No Joint: No Bone: No Debridement - Excisional N/A N/A Debridement: Pre-procedure Verification/Time Out 14:31 N/A N/A Taken: Other N/A N/A Pain Control: Other, Subcutaneous N/A N/A Tissue Debrided: Skin/Subcutaneous Tissue N/A N/A Level: 1.54 N/A N/A Debridement A (sq cm): rea Curette, Forceps N/A N/A Instrument: Swab N/A N/A Specimen: 1 N/A N/A  Number of Specimens Taken: Minimum N/A N/A Bleeding: Pressure N/A N/A Hemostasis A chieved: Procedure was tolerated well N/A N/A Debridement Treatment Response: 1.4x1.1x0.3 N/A N/A Post Debridement Measurements L x W x D (cm) 0.363 N/A N/A Post Debridement Volume: (cm) Periwound erythema N/A N/A Assessment Notes: Debridement N/A N/A Procedures Performed: Treatment Notes Wound #1 (Lower Leg) Wound Laterality: Left, Anterior Cleanser Soap and Water Discharge Instruction: May shower and wash wound with dial antibacterial soap and water prior to dressing change. Wound Cleanser Discharge Instruction: Cleanse the wound with wound cleanser prior to applying a clean dressing using gauze sponges, not tissue or cotton balls. Peri-Wound Care Triamcinolone 15 (g) Discharge Instruction: Use triamcinolone 15 (g) as directed Sween Lotion (Moisturizing lotion) Discharge Instruction: Apply moisturizing lotion as directed Topical Primary Dressing Promogran Prisma Matrix, 4.34 (sq in) (silver collagen) Discharge Instruction: Moisten collagen with saline or hydrogel Secondary Dressing ABD  Pad, 5x9 Discharge Instruction: Apply over primary dressing as directed. Secured With Compression Wrap ThreePress (3 layer compression wrap) Discharge Instruction: Apply three layer compression as directed. Compression Stockings Add-Ons Electronic Signature(s) Signed: 03/03/2021 4:28:08 PM By: Linton Ham MD Signed: 03/03/2021 5:00:37 PM By: Levan Hurst RN, BSN Entered By: Linton Ham on 03/03/2021 15:01:06 -------------------------------------------------------------------------------- Multi-Disciplinary Care Plan Details Patient Name: Date of Service: [name], [name] 03/03/2021 1:45 PM Medical Record Number: [id number] Patient Account Number: [id number] Date of Birth/Sex: Treating RN: 1935/10/20 (86 y.o. [name] Primary Care TRUE Shackleford: [place] RRISO Birder Robson Other Clinician: Referring Ellora Varnum: Treating Kindal Ponti/Extender: Linton Ham [place] RRISO N, HA YDEN Weeks in Treatment: 0 Active Inactive Venous Leg Ulcer Nursing Diagnoses: Potential for venous Insuffiency (use before diagnosis confirmed) Goals: Patient will maintain optimal edema control Date Initiated: 03/03/2021 Target Resolution Date: 03/31/2021 Goal Status: Active Interventions: Compression as ordered Provide education on venous insufficiency Treatment Activities: Therapeutic compression applied : 03/03/2021 Notes: Wound/Skin Impairment Nursing Diagnoses: Impaired tissue integrity Goals: Patient/caregiver will verbalize understanding of skin care regimen Date Initiated: 03/03/2021 Target Resolution Date: 03/31/2021 Goal Status: Active Ulcer/skin breakdown will have a volume reduction of 30% by week 4 Date Initiated: 03/03/2021 Target Resolution Date: 03/31/2021 Goal Status: Active Interventions: Assess patient/caregiver ability to obtain necessary supplies Assess patient/caregiver ability to perform ulcer/skin care regimen upon admission and as needed Assess ulceration(s) every visit Provide  education on ulcer and skin care Treatment Activities: Topical wound management initiated : 03/03/2021 Notes: Electronic Signature(s) Signed: 03/03/2021 3:37:21 PM By: Lorrin Jackson Entered By: Lorrin Jackson on 03/03/2021 15:37:21 -------------------------------------------------------------------------------- Pain Assessment Details Patient Name: Date of Service: [name] 03/03/2021 1:45 PM Medical Record Number: [id number] Patient Account Number: [id number] Date of Birth/Sex: Treating RN: 23-Feb-1935 (86 y.o. [name] Primary Care Thurley Francesconi: [place] RRISO Birder Robson Other Clinician: Referring Maddelyn Rocca: Treating Deniz Eskridge/Extender: Linton Ham [place] RRISO N, HA YDEN Weeks in Treatment: 0 Active Problems Location of Pain Severity and Description of Pain Patient Has Paino Yes Site Locations Pain Location: Pain Location: Pain in Ulcers With Dressing Change: Yes Duration of the Pain. Constant / Intermittento Intermittent Rate the pain. Current Pain Level: 3 Character of Pain Describe the Pain: Tender Pain Management and Medication Current Pain Management: Medication: Yes Cold Application: No Rest: Yes Massage: No Activity: No T.E.N.S.: No Heat Application: No Leg drop or elevation: No Is the Current Pain Management Adequate: Adequate How does your wound impact your activities of daily livingo Sleep: No Bathing: No Appetite: No Relationship With Others: No Bladder Continence: No Emotions: No Bowel Continence: No Work: No Toileting:  No Drive: No Dressing: No Hobbies: No Electronic Signature(s) Signed: 03/03/2021 4:43:10 PM By: Lorrin Jackson Entered By: Lorrin Jackson on 03/03/2021 13:57:05 -------------------------------------------------------------------------------- Patient/Caregiver Education Details Patient Name: Date of Service: [name] 1/10/2023andnbsp1:45 PM Medical Record Number: [id number] Patient Account Number:  [id number] Date of Birth/Gender: Treating RN: [birth year] (86 y.o. [name] Primary Care Physician: [place] Monica Becton Other Clinician: Referring Physician: Treating Physician/Extender: Linton Ham [place] RRISO Delane Ginger, HA Maylene Roes in Treatment: 0 Education Assessment Education Provided To: Patient Education Topics Provided Venous: Methods: Explain/Verbal, Printed Responses: State content correctly Wound/Skin Impairment: Methods: Explain/Verbal, Printed Responses: State content correctly Electronic Signature(s) Signed: 03/03/2021 4:43:10 PM By: Lorrin Jackson Entered By: Lorrin Jackson on 03/03/2021 15:37:33 -------------------------------------------------------------------------------- Wound Assessment Details Patient Name: Date of Service: [name]. 03/03/2021 1:45 PM Medical Record Number: [id number] Patient Account Number: [id number] Date of Birth/Sex: Treating RN: 05/16/1935 (86 y.o. [name] Primary Care Eligah Anello: [place] RRISO Birder Robson Other Clinician: Referring Leyani Gargus: Treating Noa Constante/Extender: Linton Ham MO RRISO N, HA YDEN Weeks in Treatment: 0 Wound Status Wound Number: 1 Primary Etiology: Infection - not elsewhere classified Wound Location: Left, Anterior Lower Leg Wound Status: Open Wounding Event: Surgical Injury Comorbid History: Cataracts, Hypertension, Osteoarthritis Date Acquired: 11/04/2020 Weeks Of Treatment: 0 Clustered Wound: No Photos Wound Measurements Length: (cm) 1.4 Width: (cm) 1.1 Depth: (cm) 0.3 Area: (cm) 1.21 Volume: (cm) 0.363 % Reduction in Area: 0% % Reduction in Volume: 0% Tunneling: Yes Position (o'clock): 10 Maximum Distance: (cm) 0.5 Wound Description Classification: Full Thickness Without Exposed Support Structures Wound Margin: Well defined, not attached Exudate Amount: Medium Exudate Type: Purulent Exudate Color: yellow, brown, green Foul Odor After Cleansing: No Slough/Fibrino  Yes Wound Bed Granulation Amount: Large (67-100%) Exposed Structure Granulation Quality: Red Fascia Exposed: No Necrotic Amount: Small (1-33%) Fat Layer (Subcutaneous Tissue) Exposed: Yes Necrotic Quality: Adherent Slough Tendon Exposed: No Muscle Exposed: No Joint Exposed: No Bone Exposed: No Assessment Notes Periwound erythema Treatment Notes Wound #1 (Lower Leg) Wound Laterality: Left, Anterior Cleanser Soap and Water Discharge Instruction: May shower and wash wound with dial antibacterial soap and water prior to dressing change. Wound Cleanser Discharge Instruction: Cleanse the wound with wound cleanser prior to applying a clean dressing using gauze sponges, not tissue or cotton balls. Peri-Wound Care Triamcinolone 15 (g) Discharge Instruction: Use triamcinolone 15 (g) as directed Sween Lotion (Moisturizing lotion) Discharge Instruction: Apply moisturizing lotion as directed Topical Primary Dressing Promogran Prisma Matrix, 4.34 (sq in) (silver collagen) Discharge Instruction: Moisten collagen with saline or hydrogel Secondary Dressing ABD Pad, 5x9 Discharge Instruction: Apply over primary dressing as directed. Secured With Compression Wrap ThreePress (3 layer compression wrap) Discharge Instruction: Apply three layer compression as directed. Compression Stockings Add-Ons Electronic Signature(s) Signed: 03/03/2021 4:43:10 PM By: Lorrin Jackson Entered By: Lorrin Jackson on 03/03/2021 13:59:31 -------------------------------------------------------------------------------- Vitals Details Patient Name: Date of Service: [name]. 03/03/2021 1:45 PM Medical Record Number: [id number] Patient Account Number: [id number] Date of Birth/Sex: Treating RN: 02-11-1936 (86 y.o. [name] Primary Care Joaquina Nissen: [place] RRISO Birder Robson Other Clinician: Referring Kalina Morabito: Treating Paula Busenbark/Extender: Linton Ham [place] RRISO N, HA YDEN Weeks in Treatment: 0 Vital  Signs Time Taken: 13:35 Temperature (F): 98.4 Pulse (bpm): 76 Respiratory Rate (breaths/min): 18 Blood Pressure (mmHg): 190/83 Reference Range: 80 - 120 mg / dl Electronic Signature(s) Signed: 03/03/2021 4:43:10 PM By: Lorrin Jackson Entered By: Lorrin Jackson on 03/03/2021 13:35:36

## 2021-03-10 ENCOUNTER — Other Ambulatory Visit: Payer: Self-pay

## 2021-03-10 ENCOUNTER — Encounter (HOSPITAL_BASED_OUTPATIENT_CLINIC_OR_DEPARTMENT_OTHER): Payer: Medicare HMO | Admitting: Internal Medicine

## 2021-03-10 DIAGNOSIS — L97228 Non-pressure chronic ulcer of left calf with other specified severity: Secondary | ICD-10-CM | POA: Diagnosis not present

## 2021-03-10 NOTE — Progress Notes (Addendum)
[name], [name] ([id number]) Visit Report for 03/10/2021 HPI Details Patient Name: Date of Service: [name], [name] 03/10/2021 1:00 PM Medical Record Number: [id number] Patient Account Number: [id number] Date of Birth/Sex: Treating RN: 1935/08/09 (86 y.o. [name] Primary Care Provider: Patrecia Pace Other Clinician: Referring Provider: Treating Provider/Extender: Donita Brooks in Treatment: 1 History of Present Illness HPI Description: ADMISSION 03/03/2021 This is a 86 year old woman who tells me she fell 13 years ago. She had lumps on her legs since then. There was never an open wound at the time. These "lumps" were hard but nonpainful. She was seen by dermatology at [facility] dermatology who did she Dr. Martin Majestic in September who performed a punch biopsy on 1 of these. We do not have these results. The area was sutured and then sometime later the sutures were removed and the wound dehisced. She developed redness and swelling in the area and received a course of Vantin as well as prior courses of Augmentin and doxycycline. A culture done at the end of December showed Corynebacterium which is a common skin contaminant and unlikely to have been a true pathogen. She is here for review of this. She has been using Silvadene cream Past medical history includes migraines hypertension arthritis she is a prediabetic with a last hemoglobin of 6 apparently to have bladder surgery early in February ABI in the left leg was not obtained Because of1/17/2023; patient admitted to the clinic last week. 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 This underwent a biopsy by [facility] dermatology. The biopsy showed traumatic panniculitis and identified areas of fat necrosis calcification and old areas of hemorrhage. She arrived in the clinic with a probing area with underlying calcification. Swallowing I put this in compression. PCR  culture showed Enterobacter and Actinotignum. The latter of which I never really heard of researching shows a cause of UTIs in older adults. Treatment is 2 weeks of a beta-lactam antibiotic. I started the patient on cefdinir for the Enterobacter. She is allergic to quinolones and sulfa She continues to have erythema around the wound I marked the margins of this last time no different this time. She says dermatology injected this area with steroids in order to control this I do not know that its done anything Electronic Signature(s) Signed: 03/10/2021 4:48:32 PM By: Linton Ham MD Entered By: Linton Ham on 03/10/2021 13:45:02 -------------------------------------------------------------------------------- Physical Exam Details Patient Name: Date of Service: Diane Ano. 03/10/2021 1:00 PM Medical Record Number: [id number] Patient Account Number: [id number] Date of Birth/Sex: Treating RN: Feb 21, 1936 (86 y.o. [name] Primary Care Provider: Patrecia Pace Other Clinician: Referring Provider: Treating Provider/Extender: Donita Brooks in Treatment: 1 Constitutional Patient is hypertensive.. Pulse regular and within target range for patient.Marland Kitchen Respirations regular, non-labored and within target range.. Temperature is normal and within the target range for the patient.Marland Kitchen Appears in no distress. Integumentary (Hair, Skin) . Notes Wound exam; left medial calf punched-out area with underlying gritty calcification. She has a new area superiorly where ther was tunneling last time. Again she has inflammation around both wound areas now, both wound areas have palpable calcification with a Q-tip. Erythema around the wound in a circular distribution. This is nontender except when to get close to the 2 small areas. Electronic Signature(s) Signed: 03/10/2021 4:48:32 PM By: Linton Ham MD Entered By: Linton Ham on 03/10/2021  13:47:12 -------------------------------------------------------------------------------- Physician Orders Details Patient Name: Date of Service: Diane Ano.  03/10/2021 1:00 PM Medical Record Number: [id number] Patient Account Number: [id number] Date of Birth/Sex: Treating RN: [birth year] (86 y.o. 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 - with Dr. Dellia Nims Return A Bathing/ Shower/ Hygiene May shower with protection but do not get wound dressing(s) wet. - May use cast protector bag. Can get at [facility] or [facility] Edema Control - Lymphedema / SCD / Other Elevate legs to the level of the heart or above for 30 minutes daily and/or when sitting, a frequency of: Avoid standing for long periods of time. Additional Orders / Instructions Follow Nutritious Diet Wound Treatment Wound #1 - Lower Leg Wound Laterality: Left, Anterior Cleanser: Soap and Water 1 x Per Week/30 Days Discharge Instructions: May shower and wash wound with dial antibacterial soap and water prior to dressing change. Cleanser: Wound Cleanser 1 x Per Week/30 Days Discharge Instructions: Cleanse the wound with wound cleanser prior to applying a clean dressing using gauze sponges, not tissue or cotton balls. Peri-Wound Care: Triamcinolone 15 (g) 1 x Per Week/30 Days Discharge Instructions: Use triamcinolone 15 (g) as directed Peri-Wound Care: Sween Lotion (Moisturizing lotion) 1 x Per Week/30 Days Discharge Instructions: Apply moisturizing lotion as  directed Topical: Gentamicin 1 x Per Week/30 Days Discharge Instructions: As directed by physician Prim Dressing: Promogran Prisma Matrix, 4.34 (sq in) (silver collagen) 1 x Per Week/30 Days ary Discharge Instructions: Moisten collagen with saline or hydrogel Secondary Dressing: Woven Gauze Sponge, Non-Sterile 4x4 in 1 x Per Week/30 Days Discharge Instructions: Apply over primary dressing as directed. Secondary Dressing: ABD Pad, 5x9 1 x Per Week/30 Days Discharge Instructions: Apply over primary dressing as directed. Compression Wrap: ThreePress (3 layer compression wrap) 1 x Per Week/30 Days Discharge Instructions: Apply three layer compression as directed. Wound #2 - Lower Leg Wound Laterality: Left, Anterior, Proximal Cleanser: Soap and Water 1 x Per Week/30 Days Discharge Instructions: May shower and wash wound with dial antibacterial soap and water prior to dressing change. Cleanser: Wound Cleanser 1 x Per Week/30 Days Discharge Instructions: Cleanse the wound with wound cleanser prior to applying a clean dressing using gauze sponges, not tissue or cotton balls. Peri-Wound Care: Triamcinolone 15 (g) 1 x Per Week/30 Days Discharge Instructions: Use triamcinolone 15 (g) as directed Peri-Wound Care: Sween Lotion (Moisturizing lotion) 1 x Per Week/30 Days Discharge Instructions: Apply moisturizing lotion as directed Topical: Gentamicin 1 x Per Week/30 Days Discharge Instructions: As directed by physician Prim Dressing: Promogran Prisma Matrix, 4.34 (sq in) (silver collagen) 1 x Per Week/30 Days ary Discharge Instructions: Moisten collagen with saline or hydrogel Secondary Dressing: Woven Gauze Sponge, Non-Sterile 4x4 in 1 x Per Week/30 Days Discharge Instructions: Apply over primary dressing as directed. Secondary Dressing: ABD Pad, 5x9 1 x Per Week/30 Days Discharge Instructions: Apply over primary dressing as directed. Compression Wrap: ThreePress (3 layer compression wrap) 1 x Per  Week/30 Days Discharge Instructions: Apply three layer compression as directed. Laboratory naerobe culture (MICRO) - Left distal anterior lower leg - (ICD10 L97.228 - Non-pressure chronic Bacteria identified in Unspecified specimen by A ulcer of left calf with other specified severity) LOINC Code: 937-9 Convenience Name: Anerobic culture Patient Medications llergies: Sulfa (Sulfonamide  Antibiotics), ciprofloxacin, lovastatin, sulfamethoxazole, Mevacor A Notifications Medication Indication Start End wound infection 03/10/2021 cefdinir DOSE oral 300 mg capsule - 1 capsule oral bid for a further 7 days (continuing rx) Electronic Signature(s) Signed: 03/10/2021 1:53:06 PM By: Linton Ham MD Entered By: Linton Ham on 03/10/2021 13:53:06 -------------------------------------------------------------------------------- Problem List Details Patient Name: Date of Service: Diane Ano. 03/10/2021 1:00 PM Medical Record Number: [id number] Patient Account Number: [id number] Date of Birth/Sex: Treating RN: 1935-09-01 (86 y.o. 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 03/10/2021 1:00 PM Medical Record  Number: [id number] Patient Account Number: [id number] Date of Birth/Sex: Treating RN: 04-Apr-1935 (86 y.o. [name] Primary Care Provider: Patrecia Pace Other Clinician: Referring Provider: Treating Provider/Extender: Donita Brooks in Treatment: 1 Subjective History of Present Illness (HPI) ADMISSION 03/03/2021 This is a 86 year old woman who tells me she fell 13 years ago. She had lumps on her legs since then. There was never an open wound at the time. These "lumps" were hard but nonpainful. She was seen by dermatology at [facility] dermatology who did she Dr. Martin Majestic in September who performed a punch biopsy on 1 of these. We do not have these results. The area was sutured and then sometime later the sutures were removed and the wound dehisced. She developed redness and swelling in the area and received a course of Vantin as well as prior courses of Augmentin and doxycycline. A culture done at the end of December showed Corynebacterium which is a common skin contaminant and unlikely to have been a true pathogen. She is here for review of this. She has been using Silvadene cream Past medical history includes migraines hypertension arthritis she is a prediabetic with a last hemoglobin of 6 apparently to have bladder surgery early in February ABI in the left leg was not obtained Because of1/17/2023; patient admitted to the clinic last week. 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 This underwent a biopsy by [facility] dermatology. The biopsy showed traumatic panniculitis and identified areas of fat necrosis calcification and old areas of hemorrhage. She arrived in the clinic with a probing area with underlying calcification. Swallowing I put this in compression. PCR culture showed Enterobacter and Actinotignum. The latter of which I never really heard of researching shows a cause of UTIs in older adults. Treatment is  2 weeks of a beta-lactam antibiotic. I started the patient on cefdinir for the Enterobacter. She is allergic to quinolones and sulfa She continues to have erythema around the wound I marked the margins of this last time no different this time. She says dermatology injected this area with steroids in order to control this I do not know that its done anything Objective Constitutional Patient is hypertensive.. Pulse regular and within target range for patient.Marland Kitchen Respirations regular, non-labored and within target range.. Temperature is normal and within the target range for the patient.Marland Kitchen Appears in no distress. Vitals Time Taken:  12:53 PM, Temperature: 98.4 F, Pulse: 67 bpm, Respiratory Rate: 18 breaths/min, Blood Pressure: 177/83 mmHg. General Notes: Wound exam; left medial calf punched-out area with underlying gritty calcification. She has a new area superiorly where ther was tunneling last time. Again she has inflammation around both wound areas now, both wound areas have palpable calcification with a Q-tip. Erythema around the wound in a circular distribution. This is nontender except when to get close to the 2 small areas. Integumentary (Hair, Skin) Wound #1 status is Open. Original cause of wound was Surgical Injury. The date acquired was: 11/04/2020. The wound has been in treatment 1 weeks. The wound is located on the Left,Anterior Lower Leg. The wound measures 1.3cm length x 1cm width x 0.4cm depth; 1.021cm^2 area and 0.408cm^3 volume. There is Fat Layer (Subcutaneous Tissue) exposed. There is no tunneling noted, however, there is undermining starting at 3:00 and ending at 6:00 with a maximum distance of 0.7cm. There is a medium amount of serosanguineous drainage noted. The wound margin is well defined and not attached to the wound base. There is large (67-100%) pink granulation within the wound bed. There is a small (1-33%) amount of necrotic tissue within the wound bed including Adherent  Slough. Wound #2 status is Open. Original cause of wound was Gradually Appeared. The date acquired was: 03/10/2021. The wound is located on the Left,Proximal,Anterior Lower Leg. The wound measures 0.4cm length x 1.2cm width x 0.5cm depth; 0.377cm^2 area and 0.188cm^3 volume. There is Fat Layer (Subcutaneous Tissue) exposed. There is no undermining noted, however, there is tunneling at 5:00 with a maximum distance of 1cm. There is a medium amount of purulent drainage noted. Foul odor after cleansing was noted. The wound margin is well defined and not attached to the wound base. There is large (67-100%) red granulation within the wound bed. There is no necrotic tissue within the wound bed. 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 There was a Three Layer Compression Therapy Procedure by Levan Hurst, RN. Post procedure Diagnosis Wound #1: Same as Pre-Procedure Wound #2 Pre-procedure diagnosis of Wound #2 is an Atypical located on the Left,Proximal,Anterior Lower Leg . There was a Three Layer Compression Therapy Procedure by Levan Hurst, RN. Post procedure Diagnosis Wound #2: Same as Pre-Procedure Plan Follow-up Appointments: Return Appointment in 1 week. - with Dr. Arcola Jansky Shower/ Hygiene: May shower with protection but do not get wound dressing(s) wet. - May use cast protector bag. Can get at [facility] or [facility] Edema Control - Lymphedema / SCD / Other: Elevate legs to the level of the heart or above for 30 minutes daily and/or when sitting, a frequency of: Avoid standing for long periods of time. Additional Orders / Instructions: Follow Nutritious Diet Laboratory  ordered were: Anerobic culture - Left distal anterior lower leg WOUND #1: - Lower Leg Wound Laterality: Left, Anterior Cleanser: Soap and Water 1 x Per Week/30 Days Discharge Instructions: May shower and wash wound with dial antibacterial soap and water prior to dressing change. Cleanser: Wound Cleanser 1 x Per Week/30 Days Discharge Instructions: Cleanse the wound with wound cleanser prior to applying a clean dressing using gauze sponges, not tissue or cotton balls. Peri-Wound Care: Triamcinolone 15 (g) 1 x Per Week/30 Days Discharge Instructions: Use  triamcinolone 15 (g) as directed Peri-Wound Care: Sween Lotion (Moisturizing lotion) 1 x Per Week/30 Days Discharge Instructions: Apply moisturizing lotion as directed Topical: Gentamicin 1 x Per Week/30 Days Discharge Instructions: As directed by physician Prim Dressing: Promogran Prisma Matrix, 4.34 (sq in) (silver collagen) 1 x Per Week/30 Days ary Discharge Instructions: Moisten collagen with saline or hydrogel Secondary Dressing: Woven Gauze Sponge, Non-Sterile 4x4 in 1 x Per Week/30 Days Discharge Instructions: Apply over primary dressing as directed. Secondary Dressing: ABD Pad, 5x9 1 x Per Week/30 Days Discharge Instructions: Apply over primary dressing as directed. Com pression Wrap: ThreePress (3 layer compression wrap) 1 x Per Week/30 Days Discharge Instructions: Apply three layer compression as directed. WOUND #2: - Lower Leg Wound Laterality: Left, Anterior, Proximal Cleanser: Soap and Water 1 x Per Week/30 Days Discharge Instructions: May shower and wash wound with dial antibacterial soap and water prior to dressing change. Cleanser: Wound Cleanser 1 x Per Week/30 Days Discharge Instructions: Cleanse the wound with wound cleanser prior to applying a clean dressing using gauze sponges, not tissue or cotton balls. Peri-Wound Care: Triamcinolone 15 (g) 1 x Per Week/30 Days Discharge Instructions: Use triamcinolone 15 (g) as  directed Peri-Wound Care: Sween Lotion (Moisturizing lotion) 1 x Per Week/30 Days Discharge Instructions: Apply moisturizing lotion as directed Topical: Gentamicin 1 x Per Week/30 Days Discharge Instructions: As directed by physician Prim Dressing: Promogran Prisma Matrix, 4.34 (sq in) (silver collagen) 1 x Per Week/30 Days ary Discharge Instructions: Moisten collagen with saline or hydrogel Secondary Dressing: Woven Gauze Sponge, Non-Sterile 4x4 in 1 x Per Week/30 Days Discharge Instructions: Apply over primary dressing as directed. Secondary Dressing: ABD Pad, 5x9 1 x Per Week/30 Days Discharge Instructions: Apply over primary dressing as directed. Com pression Wrap: ThreePress (3 layer compression wrap) 1 x Per Week/30 Days Discharge Instructions: Apply three layer compression as directed. 1. #1 gentamicin to both wound areas 2. I did a plain culture of the larger inferior wound 3. I am going to renew her cefdinir for a further week [2 weeks of a beta-lactam for Actinotignum 4 I found myself wondering if this area may need to be reopened surgically perhaps by plastic surgery cleaned out and then perhaps closed secondarily Electronic Signature(s) Signed: 03/10/2021 4:48:32 PM By: Linton Ham MD Entered By: Linton Ham on 03/10/2021 13:49:39 -------------------------------------------------------------------------------- SuperBill Details Patient Name: Date of Service: Diane Ano. 03/10/2021 Medical Record Number: [id number] Patient Account Number: [id number] Date of Birth/Sex: Treating RN: 04/21/1935 (86 y.o. 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 Facility Procedures CPT4 Code: [id number] Description: (Facility Use Only) [phone] - [place] LWR LT LEG Modifier: Quantity: 1 Physician Procedures : CPT4 Code Description Modifier [id number] - WC PHYS LEVEL 4 - EST PT ICD-10 Diagnosis Description T81.31XD Disruption of external operation (surgical) wound, not elsewhere classified, subsequent encounter L97.228 Non-pressure chronic ulcer of left  calf with other specified severity 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

## 2021-03-11 NOTE — Progress Notes (Signed)
MILLEY, [name] ([id number]) Visit Report for 03/10/2021 Arrival Information Details Patient Name: Date of Service: [name], [name] 03/10/2021 1:00 PM Medical Record Number: [id number] Patient Account Number: [id number] Date of Birth/Sex: Treating RN: 1936-02-09 (87 y.o. [name] Primary Care Brae Gartman: Patrecia Pace Other Clinician: Referring Minnie Shi: Treating Malaisha Silliman/Extender: Donita Brooks in Treatment: 1 Visit Information History Since Last Visit Added or deleted any medications: No Patient Arrived: Ambulatory Any new allergies or adverse reactions: No Arrival Time: 12:51 Had a fall or experienced change in No Accompanied By: alone activities of daily living that may affect Transfer Assistance: None risk of falls: Patient Identification Verified: Yes Signs or symptoms of abuse/neglect since last visito No Secondary Verification Process Completed: Yes Hospitalized since last visit: No Patient Requires Transmission-Based Precautions: No Implantable device outside of the clinic excluding No Patient Has Alerts: Yes cellular tissue based products placed in the center Patient Alerts: L ABI: Non Comp since last visit: Has Dressing in Place as Prescribed: Yes Has Compression in Place as Prescribed: Yes Pain Present Now: No Electronic Signature(s) Signed: 03/11/2021 5:10:09 PM By: Levan Hurst RN, BSN Entered By: Levan Hurst on 03/10/2021 12:52:53 -------------------------------------------------------------------------------- Compression Therapy Details Patient Name: Date of Service: Diane Ano. 03/10/2021 1:00 PM Medical Record Number: [id number] Patient Account Number: [id number] Date of Birth/Sex: Treating RN: [birth year] (86 y.o. [name] Primary Care Cristabel Bicknell: Patrecia Pace Other Clinician: Referring Savior Himebaugh: Treating Ottilie Wigglesworth/Extender: Donita Brooks in Treatment: 1 Compression Therapy  Performed for Wound Assessment: Wound #1 Left,Anterior Lower Leg Performed By: Clinician Levan Hurst, RN Compression Type: Three Layer Post Procedure Diagnosis Same as Pre-procedure Electronic Signature(s) Signed: 03/11/2021 5:10:09 PM By: Levan Hurst RN, BSN Entered By: Levan Hurst on 03/10/2021 13:35:28 -------------------------------------------------------------------------------- Compression Therapy Details Patient Name: Date of Service: Diane Ano. 03/10/2021 1:00 PM Medical Record Number: [id number] Patient Account Number: [id number] Date of Birth/Sex: Treating RN: 1935-07-04 (86 y.o. [name] Primary Care Haley Fuerstenberg: Patrecia Pace Other Clinician: Referring Panhia Karl: Treating Raffi Milstein/Extender: Donita Brooks in Treatment: 1 Compression Therapy Performed for Wound Assessment: Wound #2 Left,Proximal,Anterior Lower Leg Performed By: Clinician Levan Hurst, RN Compression Type: Three Layer Post Procedure Diagnosis Same as Pre-procedure Electronic Signature(s) Signed: 03/11/2021 5:10:09 PM By: Levan Hurst RN, BSN Entered By: Levan Hurst on 03/10/2021 13:35:28 -------------------------------------------------------------------------------- Encounter Discharge Information Details Patient Name: Date of Service: Diane Ano. 03/10/2021 1:00 PM Medical Record Number: [id number] Patient Account Number: [id number] Date of Birth/Sex: Treating RN: 06/15/1935 (86 y.o. [name] Primary Care Izreal Kock: Patrecia Pace Other Clinician: Referring Micaiah Litle: Treating Rockland Kotarski/Extender: Donita Brooks in Treatment: 1 Encounter Discharge Information Items Discharge Condition: Stable Ambulatory Status: Ambulatory Discharge Destination: Home Transportation: Private Auto Accompanied By: alone Schedule Follow-up Appointment: Yes Clinical Summary of Care: Patient Declined Electronic  Signature(s) Signed: 03/11/2021 5:10:09 PM By: Levan Hurst RN, BSN Entered By: Levan Hurst on 03/10/2021 18:06:47 -------------------------------------------------------------------------------- Lower Extremity Assessment Details Patient Name: Date of Service: Diane Ano. 03/10/2021 1:00 PM Medical Record Number: [id number] Patient Account Number: [id number] Date of Birth/Sex: Treating RN: 10/09/1935 (86 y.o. [name] Primary Care Lynore Coscia: Patrecia Pace Other Clinician: Referring Eustacio Ellen: Treating Mansoor Hillyard/Extender: Donita Brooks in Treatment: 1 Edema Assessment Assessed: [name]: No] [name]: No] Edema: [Left: Ye] [Right: s] Calf Left: Right: Point of Measurement: 28 cm From Medial Instep 40 cm Ankle Left: Right: Point of Measurement: 8 cm From Medial Instep 22 cm Vascular Assessment Pulses: Dorsalis Pedis Palpable: [Left:Yes]  Electronic Signature(s) Signed: 03/11/2021 5:10:09 PM By: Levan Hurst RN, BSN Entered By: Levan Hurst on 03/10/2021 13:00:59 -------------------------------------------------------------------------------- Multi Wound Chart Details Patient Name: Date of Service: Diane Ano. 03/10/2021 1:00 PM Medical Record Number: [id number] Patient Account Number: [id number] Date of Birth/Sex: Treating RN: 1935-06-14 (86 y.o. [name] Primary Care Cardelia Sassano: Patrecia Pace Other Clinician: Referring Christion Leonhard: Treating Jabria Loos/Extender: Donita Brooks in Treatment: 1 Vital Signs Height(in): Pulse(bpm): 19 Weight(lbs): Blood Pressure(mmHg): 177/83 Body Mass Index(BMI): Temperature(F): 98.4 Respiratory Rate(breaths/min): 18 Photos: [N/A:N/A] Left, Anterior Lower Leg Left, Proximal, Anterior Lower Leg N/A Wound Location: Surgical Injury Gradually Appeared N/A Wounding Event: Infection - not elsewhere classified Atypical N/A Primary Etiology: Cataracts,  Hypertension, Cataracts, Hypertension, N/A Comorbid History: Osteoarthritis Osteoarthritis 11/04/2020 03/10/2021 N/A Date Acquired: 1 0 N/A Weeks of Treatment: Open Open N/A Wound Status: No Yes N/A Clustered Wound: N/A 2 N/A Clustered Quantity: 1.3x1x0.4 0.4x1.2x0.5 N/A Measurements L x W x D (cm) 1.021 0.377 N/A A (cm) : rea 0.408 0.188 N/A Volume (cm) : 15.60% 0.00% N/A % Reduction in A rea: -12.40% 0.00% N/A % Reduction in Volume: 5 Position 1 (o'clock): 1 Maximum Distance 1 (cm): 3 Starting Position 1 (o'clock): 6 Ending Position 1 (o'clock): 0.7 Maximum Distance 1 (cm): No Yes N/A Tunneling: Yes No N/A Undermining: Full Thickness Without Exposed Full Thickness Without Exposed N/A Classification: Support Structures Support Structures Medium Medium N/A Exudate Amount: Serosanguineous Purulent N/A Exudate Type: red, brown yellow, brown, green N/A Exudate Color: No Yes N/A Foul Odor A Cleansing: fter N/A No N/A Odor Anticipated Due to Product Use: Well defined, not attached Well defined, not attached N/A Wound Margin: Large (67-100%) Large (67-100%) N/A Granulation A mount: Pink Red N/A Granulation Quality: Small (1-33%) None Present (0%) N/A Necrotic Amount: Fat Layer (Subcutaneous Tissue): Yes Fat Layer (Subcutaneous Tissue): Yes N/A Exposed Structures: Fascia: No Fascia: No Tendon: No Tendon: No Muscle: No Muscle: No Joint: No Joint: No Bone: No Bone: No None None N/A Epithelialization: Compression Therapy Compression Therapy N/A Procedures Performed: Treatment Notes Electronic Signature(s) Signed: 03/10/2021 4:48:32 PM By: Linton Ham MD Signed: 03/11/2021 5:10:09 PM By: Levan Hurst RN, BSN Entered By: Linton Ham on 03/10/2021 13:42:18 -------------------------------------------------------------------------------- Multi-Disciplinary Care Plan Details Patient Name: Date of Service: Diane Ano. 03/10/2021 1:00  PM Medical Record Number: [id number] Patient Account Number: [id number] Date of Birth/Sex: Treating RN: January 03, 1936 (86 y.o. [name] Primary Care Dorell Gatlin: Patrecia Pace Other Clinician: Referring Vihana Kydd: Treating Theresia Pree/Extender: Donita Brooks in Treatment: 1 Active Inactive Venous Leg Ulcer Nursing Diagnoses: Potential for venous Insuffiency (use before diagnosis confirmed) Goals: Patient will maintain optimal edema control Date Initiated: 03/03/2021 Target Resolution Date: 03/31/2021 Goal Status: Active Interventions: Compression as ordered Provide education on venous insufficiency Treatment Activities: Therapeutic compression applied : 03/03/2021 Notes: Wound/Skin Impairment Nursing Diagnoses: Impaired tissue integrity Goals: Patient/caregiver will verbalize understanding of skin care regimen Date Initiated: 03/03/2021 Target Resolution Date: 03/31/2021 Goal Status: Active Ulcer/skin breakdown will have a volume reduction of 30% by week 4 Date Initiated: 03/03/2021 Target Resolution Date: 03/31/2021 Goal Status: Active Interventions: Assess patient/caregiver ability to obtain necessary supplies Assess patient/caregiver ability to perform ulcer/skin care regimen upon admission and as needed Assess ulceration(s) every visit Provide education on ulcer and skin care Treatment Activities: Topical wound management initiated : 03/03/2021 Notes: Electronic Signature(s) Signed: 03/11/2021 5:10:09 PM By: Levan Hurst RN, BSN Entered By: Levan Hurst on 03/10/2021 18:05:45 -------------------------------------------------------------------------------- Pain Assessment Details Patient Name: Date of Service: Diane Dick  M. 03/10/2021 1:00 PM Medical Record Number: [id number] Patient Account Number: [id number] Date of Birth/Sex: Treating RN: 08-29-1935 (86 y.o. [name] Primary Care Shanvi Moyd: Patrecia Pace Other  Clinician: Referring Zaynab Chipman: Treating Yutaka Holberg/Extender: Donita Brooks in Treatment: 1 Active Problems Location of Pain Severity and Description of Pain Patient Has Paino No Site Locations Pain Management and Medication Current Pain Management: Electronic Signature(s) Signed: 03/11/2021 5:10:09 PM By: Levan Hurst RN, BSN Entered By: Levan Hurst on 03/10/2021 12:54:04 -------------------------------------------------------------------------------- Patient/Caregiver Education Details Patient Name: Date of Service: [name], Diane M. 1/17/2023andnbsp1:00 PM Medical Record Number: [id number] Patient Account Number: [id number] Date of Birth/Gender: Treating RN: [birth year] (86 y.o. [name] Primary Care Physician: Patrecia Pace Other Clinician: Referring Physician: Treating Physician/Extender: Donita Brooks in Treatment: 1 Education Assessment Education Provided To: Patient Education Topics Provided Venous: Methods: Explain/Verbal Responses: State content correctly Wound/Skin Impairment: Methods: Explain/Verbal Responses: State content correctly Electronic Signature(s) Signed: 03/11/2021 5:10:09 PM By: Levan Hurst RN, BSN Entered By: Levan Hurst on 03/10/2021 18:05:56 -------------------------------------------------------------------------------- Wound Assessment Details Patient Name: Date of Service: Diane Ano. 03/10/2021 1:00 PM Medical Record Number: [id number] Patient Account Number: [id number] Date of Birth/Sex: Treating RN: 1935/11/26 (86 y.o. [name] Primary Care Shyla Gayheart: Patrecia Pace Other Clinician: Referring Syed Zukas: Treating Celisse Ciulla/Extender: Donita Brooks in Treatment: 1 Wound Status Wound Number: 1 Primary Etiology: Infection - not elsewhere classified Wound Location: Left, Anterior Lower Leg Wound Status: Open Wounding Event:  Surgical Injury Comorbid History: Cataracts, Hypertension, Osteoarthritis Date Acquired: 11/04/2020 Weeks Of Treatment: 1 Clustered Wound: No Photos Wound Measurements Length: (cm) 1.3 Width: (cm) 1 Depth: (cm) 0.4 Area: (cm) 1.021 Volume: (cm) 0.408 % Reduction in Area: 15.6% % Reduction in Volume: -12.4% Epithelialization: None Tunneling: No Undermining: Yes Starting Position (o'clock): 3 Ending Position (o'clock): 6 Maximum Distance: (cm) 0.7 Wound Description Classification: Full Thickness Without Exposed Support Structures Wound Margin: Well defined, not attached Exudate Amount: Medium Exudate Type: Serosanguineous Exudate Color: red, brown Foul Odor After Cleansing: No Slough/Fibrino Yes Wound Bed Granulation Amount: Large (67-100%) Exposed Structure Granulation Quality: Pink Fascia Exposed: No Necrotic Amount: Small (1-33%) Fat Layer (Subcutaneous Tissue) Exposed: Yes Necrotic Quality: Adherent Slough Tendon Exposed: No Muscle Exposed: No Joint Exposed: No Bone Exposed: No Treatment Notes Wound #1 (Lower Leg) Wound Laterality: Left, Anterior Cleanser Soap and Water Discharge Instruction: May shower and wash wound with dial antibacterial soap and water prior to dressing change. Wound Cleanser Discharge Instruction: Cleanse the wound with wound cleanser prior to applying a clean dressing using gauze sponges, not tissue or cotton balls. Peri-Wound Care Triamcinolone 15 (g) Discharge Instruction: Use triamcinolone 15 (g) as directed Sween Lotion (Moisturizing lotion) Discharge Instruction: Apply moisturizing lotion as directed Topical Gentamicin Discharge Instruction: As directed by physician Primary Dressing Promogran Prisma Matrix, 4.34 (sq in) (silver collagen) Discharge Instruction: Moisten collagen with saline or hydrogel Secondary Dressing Woven Gauze Sponge, Non-Sterile 4x4 in Discharge Instruction: Apply over primary dressing as directed. ABD  Pad, 5x9 Discharge Instruction: Apply over primary dressing as directed. Secured With Compression Wrap ThreePress (3 layer compression wrap) Discharge Instruction: Apply three layer compression as directed. Compression Stockings Add-Ons Electronic Signature(s) Signed: 03/11/2021 5:10:09 PM By: Levan Hurst RN, BSN Entered By: Levan Hurst on 03/10/2021 13:08:27 -------------------------------------------------------------------------------- Wound Assessment Details Patient Name: Date of Service: Diane Ano. 03/10/2021 1:00 PM Medical Record Number: [id number] Patient Account Number: [id number] Date of Birth/Sex: Treating RN: 1935/10/07 (86 y.o. [name] Primary Care Deaire Mcwhirter: Patrecia Pace  Other Clinician: Referring Alastair Hennes: Treating Octavious Zidek/Extender: Donita Brooks in Treatment: 1 Wound Status Wound Number: 2 Primary Etiology: Atypical Wound Location: Left, Proximal, Anterior Lower Leg Wound Status: Open Wounding Event: Gradually Appeared Comorbid History: Cataracts, Hypertension, Osteoarthritis Date Acquired: 03/10/2021 Weeks Of Treatment: 0 Clustered Wound: Yes Photos Wound Measurements Length: (cm) 0.4 Width: (cm) 1.2 Depth: (cm) 0.5 Clustered Quantity: 2 Area: (cm) 0.377 Volume: (cm) 0.188 % Reduction in Area: 0% % Reduction in Volume: 0% Epithelialization: None Tunneling: Yes Position (o'clock): 5 Maximum Distance: (cm) 1 Undermining: No Wound Description Classification: Full Thickness Without Exposed Support Structures Wound Margin: Well defined, not attached Exudate Amount: Medium Exudate Type: Purulent Exudate Color: yellow, brown, green Foul Odor After Cleansing: Yes Due to Product Use: No Slough/Fibrino No Wound Bed Granulation Amount: Large (67-100%) Exposed Structure Granulation Quality: Red Fascia Exposed: No Necrotic Amount: None Present (0%) Fat Layer (Subcutaneous Tissue) Exposed:  Yes Tendon Exposed: No Muscle Exposed: No Joint Exposed: No Bone Exposed: No Treatment Notes Wound #2 (Lower Leg) Wound Laterality: Left, Anterior, Proximal Cleanser Soap and Water Discharge Instruction: May shower and wash wound with dial antibacterial soap and water prior to dressing change. Wound Cleanser Discharge Instruction: Cleanse the wound with wound cleanser prior to applying a clean dressing using gauze sponges, not tissue or cotton balls. Peri-Wound Care Triamcinolone 15 (g) Discharge Instruction: Use triamcinolone 15 (g) as directed Sween Lotion (Moisturizing lotion) Discharge Instruction: Apply moisturizing lotion as directed Topical Gentamicin Discharge Instruction: As directed by physician Primary Dressing Promogran Prisma Matrix, 4.34 (sq in) (silver collagen) Discharge Instruction: Moisten collagen with saline or hydrogel Secondary Dressing Woven Gauze Sponge, Non-Sterile 4x4 in Discharge Instruction: Apply over primary dressing as directed. ABD Pad, 5x9 Discharge Instruction: Apply over primary dressing as directed. Secured With Compression Wrap ThreePress (3 layer compression wrap) Discharge Instruction: Apply three layer compression as directed. Compression Stockings Add-Ons Electronic Signature(s) Signed: 03/11/2021 5:10:09 PM By: Levan Hurst RN, BSN Entered By: Levan Hurst on 03/10/2021 13:09:14 -------------------------------------------------------------------------------- [place] Details Patient Name: Date of Service: Diane Ano. 03/10/2021 1:00 PM Medical Record Number: [id number] Patient Account Number: [id number] Date of Birth/Sex: Treating RN: 03-08-1935 (86 y.o. [name] Primary Care Izear Pine: Patrecia Pace Other Clinician: Referring Traniyah Hallett: Treating Dossie Ocanas/Extender: Donita Brooks in Treatment: 1 Vital Signs Time Taken: 12:53 Temperature (F): 98.4 Pulse (bpm): 67 Respiratory Rate  (breaths/min): 18 Blood Pressure (mmHg): 177/83 Reference Range: 80 - 120 mg / dl Electronic Signature(s) Signed: 03/11/2021 5:10:09 PM By: Levan Hurst RN, BSN Entered By: Levan Hurst on 03/10/2021 12:53:58

## 2021-03-14 LAB — AEROBIC/ANAEROBIC CULTURE W GRAM STAIN (SURGICAL/DEEP WOUND): Gram Stain: NONE SEEN

## 2021-03-15 NOTE — Therapy (Signed)
OUTPATIENT PHYSICAL THERAPY THORACOLUMBAR EVALUATION   Patient Name: TEQUILLA COUSINEAU MRN: [id number] DOB:08/03/1935, 86 y.o., female Today's Date: 03/17/2021   PT End of Session - 03/16/21 1023     Visit Number 1    Number of Visits 12    Date for PT Re-Evaluation 04/27/21    Authorization Type [employer]    PT Start Time 1020    PT Stop Time 1111    PT Time Calculation (min) 51 min    Activity Tolerance Patient tolerated treatment well    Behavior During Therapy [facility] for tasks assessed/performed             History reviewed. No pertinent past medical history. History reviewed. No pertinent surgical history. 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   HYPERGLYCEMIA, FASTING 08/22/2009   OSTEOARTHRITIS 06/13/2009   HEADACHE 06/13/2009   NEPHROLITHIASIS, HX OF 06/13/2009   HYPERLIPIDEMIA 06/11/2009   MIGRAINE HEADACHE 06/11/2009   HYPERTENSION 06/11/2009   ARTHRITIS 06/11/2009   SHOULDER PAIN, LEFT 06/11/2009   CHEST DISCOMFORT 06/11/2009    PCP: Joya Gaskins, FNP  REFERRING PROVIDER: Joya Gaskins, *  REFERRING DIAG: J18.84 (ICD-10-CM) - Sciatica, right side   THERAPY DIAG:  Pain in right hip  Muscle weakness (generalized)  ONSET DATE: 02/02/2022  SUBJECTIVE:                                                                                                                                                                                           SUBJECTIVE STATEMENT: Pt began having pain approx 6 weeks ago with no specific MOI.  At first, she had pain traveling down R LE though not now.  Pt reports her pain has improved.  Pt's pain is located in R hip/glute.  Pt has an ache and feels uncomfortable when she applies weight thru R LE.  Pt performs the step to enter her home with L LE due to pain with R LE.  Increased pain with walking, standing,  and performing household chores.  Pt states her doctor gave her a seated hip/piriformis stretch and she states she doesn't do it very often due to increased pain with stretch.    Pt had PT for imbalance in 2021 and reports improvements.  Pt states she doesn't like to walk because she is lazy, afraid to fall, and has balance issues.    Pt states she fell in 2009 and she struck her L LE on a metal piece in the shower.  She had a lump on  her leg since.  Pt was seen by dermatology in September and received a punch biopsy.  Pt is being followed for wound care and her current infection on L LE.  She has pain at the mid to distal shin.  Pt showed PT pictures of her wound from last week.  Pt is on the 4th round of antibiotics and sees wound care tomorrow.     PERTINENT HISTORY:  L LE wound, Bilat TKR, OA, CHF, prediabetes.  Pt reports having balance issues.   PAIN:  Are you having pain? No at rest NPRS scale: 0/10 currently at rest, 3-4/10 Pain location: R glute/post hip   PRECAUTIONS: Other: L LE wound; fear of falling , uses a 4 wheeled walker for longer distance  WEIGHT BEARING RESTRICTIONS No  FALLS:  Has patient fallen in last 6 months? Yes, Number of falls: 1  LIVING ENVIRONMENT: Lives with: lives with their family and lives alone Lives in: House/apartment Stairs: 1 step to enter home without rail Has following equipment at home: Gilford Rile - 4 wheeled  OCCUPATION: Pt is retired  PLOF: Independent; Pt uses a 4 wheeled walker for walking longer distance.    PATIENT GOALS walk better, reduce fear of falling, not use the walker   OBJECTIVE:   DIAGNOSTIC FINDINGS:  none  PATIENT SURVEYS:  FOTO 56 with a goal of 80  OBSERVATIONS:   Pt's L LE is wrapped.  Pt has a wound in mid to distal shin.  Pt showed PT pictures of her wound on her ant distal LE.  Pt has 2 wounds.    COGNITION:  Overall cognitive status: Within functional limits for tasks assessed     SENSATION:  Light  touch: Appears intact; 2+ to bilat LE dermatomes    PALPATION: No tenderness with palpation in lumbar paraspinals.  TTP proximal sacrum and R glute.   LUMBARAROM/PROM  A/PROM A/PROM  03/17/2021  Flexion WFL  Extension WFL  Right lateral flexion WFL  Left lateral flexion 40%  Right rotation WFL  Left rotation WFL   (Blank rows = not tested) No reproduction of pain with lumbar AROM testing except LBP with L Sb'ing.  LE AROM/PROM:  A/PROM Right 03/17/2021 Left 03/17/2021  Hip flexion 4/5   Hip extension    Hip abduction 3+/5   Hip adduction    Hip internal rotation    Hip external rotation 4/5   Knee flexion 5/5   Knee extension 4/5   Ankle dorsiflexion    Ankle plantarflexion    Ankle inversion    Ankle eversion     (Blank rows = not tested) L LE not tested due to infection.    LE MMT:  MMT Right 03/17/2021 Left 03/17/2021  Hip flexion    Hip extension    Hip abduction WFL   Hip adduction    Hip internal rotation    Hip external rotation    Knee flexion    Knee extension    Ankle dorsiflexion    Ankle plantarflexion    Ankle inversion    Ankle eversion     (Blank rows = not tested)   LUMBAR SPECIAL TESTS:  Supine SLR Test:  R:  positive    GAIT: Assistive device utilized: None Comments: Antalgic limp with reduced stance time on R LE    TODAY'S TREATMENT  See education below.   PATIENT EDUCATION:  Education details: Dx, POC, relevant anatomy, prognosis, rationale for exercises.  PT answered Pt's questions. Person educated: Patient Education  method: Explanation and visually with anatomy charts Education comprehension: verbalized understanding and needs further education   HOME EXERCISE PROGRAM: Will give next visit   ASSESSMENT:  CLINICAL IMPRESSION: Patient is a 86  y.o. female with a dx of R sided sciatica presenting to the clinic with R hip pain and muscle weakness in R LE.  Pt was having pain down R LE though states it's in her  hip/glute now.  She reports her pain has improved.  She has increased pain with walking, standing, and performing household chores.  Pt performs the step to enter her home with L LE due to pain with R LE.  Pt has an antalgic gait, positive SLR test, and weakness in R hip and quad.  PT did not assess L LE due to pt having an infection in a chronic wound in distal L LE.  Pt is receiving wound care treatment and is on antibiotics.  Pt should benefit from skilled PT to address above impairments and improve overall function.  Objective impairments include Abnormal gait, decreased activity tolerance, decreased endurance, decreased mobility, difficulty walking, decreased strength, and pain. These impairments are limiting patient from cleaning, community activity, and ambulation and stairs . Personal factors including 3+ comorbidities: L LE wound, Bilat TKR, OA, and CHF  are also affecting patient's functional outcome.   REHAB POTENTIAL: Good  CLINICAL DECISION MAKING: Stable/uncomplicated  EVALUATION COMPLEXITY: Low   GOALS:   SHORT TERM GOALS:  STG Name Target Date Goal status  1 Pt will be independent and compliant with HEP for improved pain, strength, and function.  Baseline:  04/06/2021 INITIAL  2 Pt will demo improved stance time on R LE and reduced antalgic limp with gait.  Baseline:  04/06/2021 INITIAL                            LONG TERM GOALS:   LTG Name Target Date Goal status  1 Pt will be able to enter home with R LE ascending step without increased pain. Baseline: 04/27/2021 INITIAL  2 Pt will report improved confidence with ambulation including gait stability. Baseline: 04/27/2021 INITIAL  3 Pt will report improved tolerance with standing activities including performing household chores without significant pain or difficulty.  Baseline: 04/27/2021 INITIAL  4 Pt will demo improved R LE strength to 5/5 in hip flex and ER and knee ext and 4+/5 in hip abd for improved tolerance with  and performance of functional mobility skills.  Baseline: 04/27/2021 INITIAL  5 Pt will report she is able to perform her normal ambulation without significant R LE pain.  Baseline: 04/28/2021 INITIAL  PLAN: PT FREQUENCY: 2x/week  PT DURATION: 6 weeks  PLANNED INTERVENTIONS: Therapeutic exercises, Therapeutic activity, Neuro Muscular re-education, Balance training, Gait training, Patient/Family education, Joint mobilization, Stair training, DME instructions, Electrical stimulation, Spinal mobilization, Cryotherapy, Moist heat, Taping, and Manual therapy  PLAN FOR NEXT SESSION: Pt sees wound care tomorrow and will find out if she has to have surgery on wound on L LE.  Establish HEP next visit.  Core and R hip strengthening.  Assess piriformis stretch.  Avoid L LE strengthening.    Possible STM to R glute.    Selinda Michaels III PT, DPT 03/17/21 3:13 PM

## 2021-03-16 ENCOUNTER — Other Ambulatory Visit: Payer: Self-pay

## 2021-03-16 ENCOUNTER — Ambulatory Visit (HOSPITAL_BASED_OUTPATIENT_CLINIC_OR_DEPARTMENT_OTHER): Payer: Medicare HMO | Attending: Family Medicine | Admitting: Physical Therapy

## 2021-03-16 ENCOUNTER — Encounter (HOSPITAL_BASED_OUTPATIENT_CLINIC_OR_DEPARTMENT_OTHER): Payer: Self-pay | Admitting: Physical Therapy

## 2021-03-16 DIAGNOSIS — M25551 Pain in right hip: Secondary | ICD-10-CM | POA: Diagnosis not present

## 2021-03-16 DIAGNOSIS — W228XXD Striking against or struck by other objects, subsequent encounter: Secondary | ICD-10-CM | POA: Insufficient documentation

## 2021-03-16 DIAGNOSIS — M6281 Muscle weakness (generalized): Secondary | ICD-10-CM | POA: Diagnosis not present

## 2021-03-16 DIAGNOSIS — M1990 Unspecified osteoarthritis, unspecified site: Secondary | ICD-10-CM | POA: Diagnosis not present

## 2021-03-16 DIAGNOSIS — M5431 Sciatica, right side: Secondary | ICD-10-CM | POA: Diagnosis present

## 2021-03-16 DIAGNOSIS — Z96653 Presence of artificial knee joint, bilateral: Secondary | ICD-10-CM | POA: Diagnosis not present

## 2021-03-16 DIAGNOSIS — W458XXD Other foreign body or object entering through skin, subsequent encounter: Secondary | ICD-10-CM | POA: Insufficient documentation

## 2021-03-16 DIAGNOSIS — I509 Heart failure, unspecified: Secondary | ICD-10-CM | POA: Diagnosis not present

## 2021-03-16 DIAGNOSIS — S81802D Unspecified open wound, left lower leg, subsequent encounter: Secondary | ICD-10-CM | POA: Diagnosis not present

## 2021-03-17 ENCOUNTER — Encounter (HOSPITAL_BASED_OUTPATIENT_CLINIC_OR_DEPARTMENT_OTHER): Payer: Medicare HMO | Admitting: Internal Medicine

## 2021-03-17 DIAGNOSIS — L97228 Non-pressure chronic ulcer of left calf with other specified severity: Secondary | ICD-10-CM | POA: Diagnosis not present

## 2021-03-19 NOTE — Progress Notes (Signed)
LEGACI, TARMAN ([id number]) Visit Report for 03/17/2021 Arrival Information Details Patient Name: Date of Service: [name], [name] 03/17/2021 1:00 PM Medical Record Number: [id number] Patient Account Number: [id number] Date of Birth/Sex: Treating RN: 1936-02-21 (86 y.o. [name], [name] Primary Care Malyah Ohlrich: Patrecia Pace Other Clinician: Referring Luetta Piazza: Treating Adelie Croswell/Extender: Donita Brooks in Treatment: 2 Visit Information History Since Last Visit Added or deleted any medications: No Patient Arrived: Ambulatory Any new allergies or adverse reactions: No Arrival Time: 13:29 Had a fall or experienced change in No Accompanied By: self activities of daily living that may affect Transfer Assistance: None risk of falls: Patient Identification Verified: Yes Signs or symptoms of abuse/neglect since last visito No Secondary Verification Process Completed: Yes Hospitalized since last visit: No Patient Requires Transmission-Based Precautions: No Implantable device outside of the clinic excluding No Patient Has Alerts: Yes cellular tissue based products placed in the center Patient Alerts: L ABI: Non Comp since last visit: Has Dressing in Place as Prescribed: Yes Has Compression in Place as Prescribed: Yes Pain Present Now: No Electronic Signature(s) Signed: 03/19/2021 5:37:29 PM By: Rhae Hammock RN Entered By: Rhae Hammock on 03/17/2021 13:29:26 -------------------------------------------------------------------------------- Clinic Level of Care Assessment Details Patient Name: Date of Service: [name], [name] 03/17/2021 1:00 PM Medical Record Number: [id number] Patient Account Number: [id number] Date of Birth/Sex: Treating RN: Nov 11, 1935 (86 y.o. [name], [name] Primary Care Dezarae Mcclaran: Patrecia Pace Other Clinician: Referring Dennise Bamber: Treating Accalia Rigdon/Extender: Donita Brooks in Treatment:  2 Clinic Level of Care Assessment Items TOOL 4 Quantity Score X- 1 0 Use when only an EandM is performed on FOLLOW-UP visit ASSESSMENTS - Nursing Assessment / Reassessment X- 1 10 Reassessment of Co-morbidities (includes updates in patient status) X- 1 5 Reassessment of Adherence to Treatment Plan ASSESSMENTS - Wound and Skin A ssessment / Reassessment X - Simple Wound Assessment / Reassessment - one wound 1 5 []  - 0 Complex Wound Assessment / Reassessment - multiple wounds []  - 0 Dermatologic / Skin Assessment (not related to wound area) ASSESSMENTS - Focused Assessment X- 1 5 Circumferential Edema Measurements - multi extremities []  - 0 Nutritional Assessment / Counseling / Intervention []  - 0 Lower Extremity Assessment (monofilament, tuning fork, pulses) []  - 0 Peripheral Arterial Disease Assessment (using hand held doppler) ASSESSMENTS - Ostomy and/or Continence Assessment and Care []  - 0 Incontinence Assessment and Management []  - 0 Ostomy Care Assessment and Management (repouching, etc.) PROCESS - Coordination of Care X - Simple Patient / Family Education for ongoing care 1 15 []  - 0 Complex (extensive) Patient / Family Education for ongoing care X- 1 10 Staff obtains Programmer, systems, Records, T Results / Process Orders est []  - 0 Staff telephones HHA, Nursing Homes / Clarify orders / etc []  - 0 Routine Transfer to another Facility (non-emergent condition) []  - 0 Routine Hospital Admission (non-emergent condition) []  - 0 New Admissions / Biomedical engineer / Ordering NPWT Apligraf, etc. , []  - 0 Emergency Hospital Admission (emergent condition) X- 1 10 Simple Discharge Coordination []  - 0 Complex (extensive) Discharge Coordination PROCESS - Special Needs []  - 0 Pediatric / Minor Patient Management []  - 0 Isolation Patient Management []  - 0 Hearing / Language / Visual special needs []  - 0 Assessment of Community assistance (transportation, D/C planning,  etc.) []  - 0 Additional assistance / Altered mentation []  - 0 Support Surface(s) Assessment (bed, cushion, seat, etc.) INTERVENTIONS - Wound Cleansing / Measurement X - Simple Wound Cleansing - one wound 1  5 []  - 0 Complex Wound Cleansing - multiple wounds X- 1 5 Wound Imaging (photographs - any number of wounds) []  - 0 Wound Tracing (instead of photographs) X- 1 5 Simple Wound Measurement - one wound []  - 0 Complex Wound Measurement - multiple wounds INTERVENTIONS - Wound Dressings []  - 0 Small Wound Dressing one or multiple wounds X- 1 15 Medium Wound Dressing one or multiple wounds []  - 0 Large Wound Dressing one or multiple wounds X- 1 5 Application of Medications - topical []  - 0 Application of Medications - injection INTERVENTIONS - Miscellaneous []  - 0 External ear exam []  - 0 Specimen Collection (cultures, biopsies, blood, body fluids, etc.) []  - 0 Specimen(s) / Culture(s) sent or taken to Lab for analysis []  - 0 Patient Transfer (multiple staff / Civil Service fast streamer / Similar devices) []  - 0 Simple Staple / Suture removal (25 or less) []  - 0 Complex Staple / Suture removal (26 or more) []  - 0 Hypo / Hyperglycemic Management (close monitor of Blood Glucose) []  - 0 Ankle / Brachial Index (ABI) - do not check if billed separately X- 1 5 Vital Signs Has the patient been seen at the hospital within the last three years: Yes Total Score: 100 Level Of Care: New/Established - Level 3 Electronic Signature(s) Signed: 03/19/2021 5:37:29 PM By: Rhae Hammock RN Entered By: Rhae Hammock on 03/17/2021 14:19:08 -------------------------------------------------------------------------------- Encounter Discharge Information Details Patient Name: Date of Service: Diane Ano. 03/17/2021 1:00 PM Medical Record Number: [id number] Patient Account Number: [id number] Date of Birth/Sex: Treating RN: 1935-08-30 (86 y.o. [name], [name] Primary Care Lusero Nordlund:  Patrecia Pace Other Clinician: Referring Mercadez Heitman: Treating Tarsha Blando/Extender: Donita Brooks in Treatment: 2 Encounter Discharge Information Items Discharge Condition: Stable Ambulatory Status: Ambulatory Discharge Destination: Home Transportation: Private Auto Accompanied By: self Schedule Follow-up Appointment: Yes Clinical Summary of Care: Patient Declined Electronic Signature(s) Signed: 03/19/2021 5:37:29 PM By: Rhae Hammock RN Entered By: Rhae Hammock on 03/17/2021 14:30:35 -------------------------------------------------------------------------------- Lower Extremity Assessment Details Patient Name: Date of Service: Diane Ano. 03/17/2021 1:00 PM Medical Record Number: [id number] Patient Account Number: [id number] Date of Birth/Sex: Treating RN: 1935-11-29 (86 y.o. [name], [name] Primary Care Vince Ainsley: Patrecia Pace Other Clinician: Referring Laurna Shetley: Treating Khiley Lieser/Extender: Donita Brooks in Treatment: 2 Edema Assessment Assessed: Shirlyn Goltz: Yes] Patrice Paradise: No] Edema: [Left: Ye] [Right: s] Calf Left: Right: Point of Measurement: 28 cm From Medial Instep 40 cm Ankle Left: Right: Point of Measurement: 8 cm From Medial Instep 22 cm Vascular Assessment Pulses: Dorsalis Pedis Palpable: [Left:Yes] Posterior Tibial Palpable: [Left:Yes] Electronic Signature(s) Signed: 03/19/2021 5:37:29 PM By: Rhae Hammock RN Entered By: Rhae Hammock on 03/17/2021 13:33:23 -------------------------------------------------------------------------------- Multi Wound Chart Details Patient Name: Date of Service: Diane Ano. 03/17/2021 1:00 PM Medical Record Number: [id number] Patient Account Number: [id number] Date of Birth/Sex: Treating RN: 1935/10/18 (86 y.o. [name] Primary Care Klani Caridi: Patrecia Pace Other Clinician: Referring Brei Pociask: Treating Jonai Weyland/Extender: Donita Brooks in Treatment: 2 Vital Signs Height(in): Pulse(bpm): 71 Weight(lbs): Blood Pressure(mmHg): 184/84 Body Mass Index(BMI): Temperature(F): 98.4 Respiratory Rate(breaths/min): 17 Photos: [N/A:N/A] Left, Anterior Lower Leg Left, Proximal, Anterior Lower Leg N/A Wound Location: Surgical Injury Gradually Appeared N/A Wounding Event: Infection - not elsewhere classified Atypical N/A Primary Etiology: Cataracts, Hypertension, Cataracts, Hypertension, N/A Comorbid History: Osteoarthritis Osteoarthritis 11/04/2020 03/10/2021 N/A Date Acquired: 2 1 N/A Weeks of Treatment: Open Open N/A Wound Status: No No N/A Wound Recurrence: No Yes N/A Clustered Wound: N/A 2 N/A Clustered  Quantity: 2x2x0.7 1.5x1.7x0.5 N/A Measurements L x W x D (cm) 3.142 2.003 N/A A (cm) : rea 2.199 1.001 N/A Volume (cm) : -159.70% -431.30% N/A % Reduction in A rea: -505.80% -432.40% N/A % Reduction in Volume: 12 4 Starting Position 1 (o'clock): 12 7 Ending Position 1 (o'clock): 1 0.4 Maximum Distance 1 (cm): Yes Yes N/A Undermining: Full Thickness With Exposed Support Full Thickness Without Exposed N/A Classification: Structures Support Structures Medium Medium N/A Exudate Amount: Serosanguineous Purulent N/A Exudate Type: red, brown yellow, brown, green N/A Exudate Color: No Yes N/A Foul Odor A Cleansing: fter N/A No N/A Odor Anticipated Due to Product Use: Well defined, not attached Well defined, not attached N/A Wound Margin: Medium (34-66%) Large (67-100%) N/A Granulation A mount: Pink Red N/A Granulation Quality: Medium (34-66%) None Present (0%) N/A Necrotic Amount: Fat Layer (Subcutaneous Tissue): Yes Fat Layer (Subcutaneous Tissue): Yes N/A Exposed Structures: Fascia: No Fascia: No Tendon: No Tendon: No Muscle: No Muscle: No Joint: No Joint: No Bone: No Bone: No None None N/A Epithelialization: Treatment Notes Electronic  Signature(s) Signed: 03/17/2021 4:35:26 PM By: Linton Ham MD Signed: 03/17/2021 5:46:53 PM By: Levan Hurst RN, BSN Entered By: Linton Ham on 03/17/2021 14:20:06 -------------------------------------------------------------------------------- Multi-Disciplinary Care Plan Details Patient Name: Date of Service: Diane Ano. 03/17/2021 1:00 PM Medical Record Number: [id number] Patient Account Number: [id number] Date of Birth/Sex: Treating RN: [birth year] (86 y.o. [name], [name] Primary Care Anda Sobotta: Patrecia Pace Other Clinician: Referring Saleemah Mollenhauer: Treating Marlow Berenguer/Extender: Donita Brooks in Treatment: 2 Active Inactive Venous Leg Ulcer Nursing Diagnoses: Potential for venous Insuffiency (use before diagnosis confirmed) Goals: Patient will maintain optimal edema control Date Initiated: 03/03/2021 Target Resolution Date: 03/31/2021 Goal Status: Active Interventions: Compression as ordered Provide education on venous insufficiency Treatment Activities: Therapeutic compression applied : 03/03/2021 Notes: Wound/Skin Impairment Nursing Diagnoses: Impaired tissue integrity Goals: Patient/caregiver will verbalize understanding of skin care regimen Date Initiated: 03/03/2021 Target Resolution Date: 03/31/2021 Goal Status: Active Ulcer/skin breakdown will have a volume reduction of 30% by week 4 Date Initiated: 03/03/2021 Target Resolution Date: 03/31/2021 Goal Status: Active Interventions: Assess patient/caregiver ability to obtain necessary supplies Assess patient/caregiver ability to perform ulcer/skin care regimen upon admission and as needed Assess ulceration(s) every visit Provide education on ulcer and skin care Treatment Activities: Topical wound management initiated : 03/03/2021 Notes: Electronic Signature(s) Signed: 03/19/2021 5:37:29 PM By: Rhae Hammock RN Entered By: Rhae Hammock on 03/17/2021  13:48:59 -------------------------------------------------------------------------------- Pain Assessment Details Patient Name: Date of Service: Diane Ano. 03/17/2021 1:00 PM Medical Record Number: [id number] Patient Account Number: [id number] Date of Birth/Sex: Treating RN: 1936-01-08 (86 y.o. [name], [name] Primary Care Aster Eckrich: Patrecia Pace Other Clinician: Referring Tahmid Stonehocker: Treating Tekeya Geffert/Extender: Donita Brooks in Treatment: 2 Active Problems Location of Pain Severity and Description of Pain Patient Has Paino No Site Locations Pain Management and Medication Current Pain Management: Electronic Signature(s) Signed: 03/19/2021 5:37:29 PM By: Rhae Hammock RN Entered By: Rhae Hammock on 03/17/2021 13:30:57 -------------------------------------------------------------------------------- Patient/Caregiver Education Details Patient Name: Date of Service: [name], Diane M. 1/24/2023andnbsp1:00 PM Medical Record Number: [id number] Patient Account Number: [id number] Date of Birth/Gender: Treating RN: 1935/09/08 (86 y.o. [name], [name] Primary Care Physician: Patrecia Pace Other Clinician: Referring Physician: Treating Physician/Extender: Donita Brooks in Treatment: 2 Education Assessment Education Provided To: Patient Education Topics Provided Wound/Skin Impairment: Methods: Explain/Verbal Responses: Reinforcements needed, State content correctly Electronic Signature(s) Signed: 03/19/2021 5:37:29 PM By: Rhae Hammock RN Entered By: Rhae Hammock on 03/17/2021 13:49:17 --------------------------------------------------------------------------------  Wound Assessment Details Patient Name: Date of Service: [name], [name] 03/17/2021 1:00 PM Medical Record Number: [id number] Patient Account Number: [id number] Date of Birth/Sex: Treating RN: [birth year] (86 y.o. [name],  [name] Primary Care Linc Renne: Patrecia Pace Other Clinician: Referring Deantre Bourdon: Treating Jem Castro/Extender: Donita Brooks in Treatment: 2 Wound Status Wound Number: 1 Primary Etiology: Infection - not elsewhere classified Wound Location: Left, Anterior Lower Leg Wound Status: Open Wounding Event: Surgical Injury Comorbid History: Cataracts, Hypertension, Osteoarthritis Date Acquired: 11/04/2020 Weeks Of Treatment: 2 Clustered Wound: No Photos Wound Measurements Length: (cm) 2 Width: (cm) 2 Depth: (cm) 0.7 Area: (cm) 3.142 Volume: (cm) 2.199 % Reduction in Area: -159.7% % Reduction in Volume: -505.8% Epithelialization: None Tunneling: No Undermining: Yes Starting Position (o'clock): 12 Ending Position (o'clock): 12 Maximum Distance: (cm) 1 Wound Description Classification: Full Thickness With Exposed Support Structures Wound Margin: Well defined, not attached Exudate Amount: Medium Exudate Type: Serosanguineous Exudate Color: red, brown Foul Odor After Cleansing: No Slough/Fibrino Yes Wound Bed Granulation Amount: Medium (34-66%) Exposed Structure Granulation Quality: Pink Fascia Exposed: No Necrotic Amount: Medium (34-66%) Fat Layer (Subcutaneous Tissue) Exposed: Yes Necrotic Quality: Adherent Slough Tendon Exposed: No Muscle Exposed: No Joint Exposed: No Bone Exposed: No Treatment Notes Wound #1 (Lower Leg) Wound Laterality: Left, Anterior Cleanser Soap and Water Discharge Instruction: May shower and wash wound with dial antibacterial soap and water prior to dressing change. Wound Cleanser Discharge Instruction: Cleanse the wound with wound cleanser prior to applying a clean dressing using gauze sponges, not tissue or cotton balls. Peri-Wound Care Triamcinolone 15 (g) Discharge Instruction: Use triamcinolone 15 (g) as directed Sween Lotion (Moisturizing lotion) Discharge Instruction: Apply moisturizing lotion as  directed Topical Gentamicin Discharge Instruction: As directed by physician Primary Dressing KerraCel Ag Gelling Fiber Dressing, 4x5 in (silver alginate) Discharge Instruction: Apply silver alginate to wound bed as instructed Secondary Dressing Woven Gauze Sponge, Non-Sterile 4x4 in Discharge Instruction: Apply over primary dressing as directed. ABD Pad, 5x9 Discharge Instruction: Apply over primary dressing as directed. Secured With Compression Wrap Kerlix Roll 4.5x3.1 (in/yd) Discharge Instruction: Apply Kerlix and Coban compression as directed. Coban Self-Adherent Wrap 4x5 (in/yd) Discharge Instruction: Apply over Kerlix as directed. Compression Stockings Add-Ons Electronic Signature(s) Signed: 03/19/2021 5:37:29 PM By: Rhae Hammock RN Entered By: Rhae Hammock on 03/17/2021 13:42:25 -------------------------------------------------------------------------------- Wound Assessment Details Patient Name: Date of Service: Diane Ano. 03/17/2021 1:00 PM Medical Record Number: [id number] Patient Account Number: [id number] Date of Birth/Sex: Treating RN: May 31, 1935 (86 y.o. [name], [name] Primary Care Heith Haigler: Patrecia Pace Other Clinician: Referring Brydan Downard: Treating Tacha Manni/Extender: Donita Brooks in Treatment: 2 Wound Status Wound Number: 2 Primary Etiology: Atypical Wound Location: Left, Proximal, Anterior Lower Leg Wound Status: Open Wounding Event: Gradually Appeared Comorbid History: Cataracts, Hypertension, Osteoarthritis Date Acquired: 03/10/2021 Weeks Of Treatment: 1 Clustered Wound: Yes Photos Wound Measurements Length: (cm) 1.5 Width: (cm) 1.7 Depth: (cm) 0.5 Clustered Quantity: 2 Area: (cm) 2.003 Volume: (cm) 1.001 % Reduction in Area: -431.3% % Reduction in Volume: -432.4% Epithelialization: None Tunneling: No Undermining: Yes Starting Position (o'clock): 4 Ending Position (o'clock): 7 Maximum  Distance: (cm) 0.4 Wound Description Classification: Full Thickness Without Exposed Support Structures Wound Margin: Well defined, not attached Exudate Amount: Medium Exudate Type: Purulent Exudate Color: yellow, brown, green Foul Odor After Cleansing: Yes Due to Product Use: No Slough/Fibrino No Wound Bed Granulation Amount: Large (67-100%) Exposed Structure Granulation Quality: Red Fascia Exposed: No Necrotic Amount: None Present (0%) Fat Layer (Subcutaneous Tissue) Exposed: Yes Tendon  Exposed: No Muscle Exposed: No Joint Exposed: No Bone Exposed: No Treatment Notes Wound #2 (Lower Leg) Wound Laterality: Left, Anterior, Proximal Cleanser Soap and Water Discharge Instruction: May shower and wash wound with dial antibacterial soap and water prior to dressing change. Wound Cleanser Discharge Instruction: Cleanse the wound with wound cleanser prior to applying a clean dressing using gauze sponges, not tissue or cotton balls. Peri-Wound Care Triamcinolone 15 (g) Discharge Instruction: Use triamcinolone 15 (g) as directed Sween Lotion (Moisturizing lotion) Discharge Instruction: Apply moisturizing lotion as directed Topical Gentamicin Discharge Instruction: As directed by physician Primary Dressing KerraCel Ag Gelling Fiber Dressing, 4x5 in (silver alginate) Discharge Instruction: Apply silver alginate to wound bed as instructed Secondary Dressing Woven Gauze Sponge, Non-Sterile 4x4 in Discharge Instruction: Apply over primary dressing as directed. ABD Pad, 5x9 Discharge Instruction: Apply over primary dressing as directed. Secured With Compression Wrap Kerlix Roll 4.5x3.1 (in/yd) Discharge Instruction: Apply Kerlix and Coban compression as directed. Coban Self-Adherent Wrap 4x5 (in/yd) Discharge Instruction: Apply over Kerlix as directed. Compression Stockings Add-Ons Electronic Signature(s) Signed: 03/19/2021 5:37:29 PM By: Rhae Hammock RN Entered By: Rhae Hammock on 03/17/2021 13:43:09 -------------------------------------------------------------------------------- Vitals Details Patient Name: Date of Service: Diane Ano. 03/17/2021 1:00 PM Medical Record Number: [id number] Patient Account Number: [id number] Date of Birth/Sex: Treating RN: [birth year] (86 y.o. [name], [name] Primary Care Dondrea Clendenin: Patrecia Pace Other Clinician: Referring Judiann Celia: Treating Draven Natter/Extender: Donita Brooks in Treatment: 2 Vital Signs Time Taken: 13:29 Temperature (F): 98.4 Pulse (bpm): 68 Respiratory Rate (breaths/min): 17 Blood Pressure (mmHg): 184/84 Reference Range: 80 - 120 mg / dl Electronic Signature(s) Signed: 03/19/2021 5:37:29 PM By: Rhae Hammock RN Entered By: Rhae Hammock on 03/17/2021 13:30:21

## 2021-03-19 NOTE — Progress Notes (Signed)
[name], [name] ([id number]) Visit Report for 03/17/2021 HPI Details Patient Name: Date of Service: [name], [name] 03/17/2021 1:00 PM Medical Record Number: [id number] Patient Account Number: [id number] Date of Birth/Sex: Treating RN: 1936-01-20 (86 y.o. [name] Primary Care Provider: Patrecia Harper Other Clinician: Referring Provider: Treating Provider/Extender: [name] in Treatment: 2 History of Present Illness HPI Description: ADMISSION 03/03/2021 This is a 86 year old woman who tells me she fell 13 years ago. She had lumps on her legs since then. There was never an open wound at the time. These "lumps" were hard but nonpainful. She was seen by dermatology at [facility] dermatology who did she [name] in September who performed a punch biopsy on 1 of these. We do not have these results. The area was sutured and then sometime later the sutures were removed and the wound dehisced. She developed redness and swelling in the area and received a course of Vantin as well as prior courses of Augmentin and doxycycline. A culture done at the end of December showed Corynebacterium which is a common skin contaminant and unlikely to have been a true pathogen. She is here for review of this. She has been using Silvadene cream Past medical history includes migraines hypertension arthritis she is a prediabetic with a last hemoglobin of 6 apparently to have bladder surgery early in February ABI in the left leg was not obtained Because of1/17/2023; patient admitted to the clinic last week. 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 This underwent a biopsy by [facility] dermatology. The biopsy showed traumatic panniculitis and identified areas of fat necrosis calcification and old areas of hemorrhage. She arrived in the clinic with a probing area with underlying calcification. Swallowing I put this in compression. PCR  culture showed Enterobacter and Actinotignum. The latter of which I never really heard of researching shows a cause of UTIs in older adults. Treatment is 2 weeks of a beta-lactam antibiotic. I started the patient on cefdinir for the Enterobacter. She is allergic to quinolones and sulfa She continues to have erythema around the wound I marked the margins of this last time no different this time. She says dermatology injected this area with steroids in order to control this I do not know that its done anything 1/24; culture I did last time showed it once again Enterobacter cloacae I. The cefdinir that I gave her should have covered this we are also using topical gentamicin. Unfortunately she arrives with the wound looking somewhat worse. She has original wound the satellite wound and several small satellite wounds around this. Each 1 of these has underlying calcification. I spoken to the patient the length of her initial hard area was about the same length is what is opening now.. The erythema around the wound however looks somewhat better Electronic Signature(s) Signed: 03/17/2021 4:35:26 PM By: Diane Ham MD Entered By: [name] on 03/17/2021 14:22:53 -------------------------------------------------------------------------------- Physical Exam Details Patient Name: Date of Service: Diane Ano. 03/17/2021 1:00 PM Medical Record Number: [id number] Patient Account Number: [id number] Date of Birth/Sex: Treating RN: 06/01/1935 (86 y.o. [name] Primary Care Provider: Patrecia Harper Other Clinician: Referring Provider: Treating Provider/Extender: [name] in Treatment: 2 Constitutional Patient is hypertensive.. Pulse regular and within target range for patient.Marland Kitchen Respirations regular, non-labored and within target range.. Temperature is normal and within the target range for the patient.Marland Kitchen Appears in no distress. Notes Wound exam; left  medial calf  punched-out area with underlying calcification the satellite area that we identified last time also has expanded and has underlying calcifications she has several areas around this. There is inflammation around the open areas but the erythematous area that we marked last time is somewhat better. Electronic Signature(s) Signed: 03/17/2021 4:35:26 PM By: Diane Ham MD Entered By: [name] on 03/17/2021 14:23:53 -------------------------------------------------------------------------------- Physician Orders Details Patient Name: Date of Service: Diane Ano. 03/17/2021 1:00 PM Medical Record Number: [id number] Patient Account Number: [id number] Date of Birth/Sex: Treating RN: 06-23-1935 (86 y.o. [name], [name] Primary Care Provider: Patrecia Harper Other Clinician: Referring Provider: Treating Provider/Extender: [name] in Treatment: 2 Verbal / Phone Orders: No Diagnosis Coding Follow-up Appointments ppointment in 1 week. - with Dr. Dellia Harper Return A Bathing/ Shower/ Hygiene May shower with protection but do not get wound dressing(s) wet. - May use cast protector bag. Can get at [facility] or [facility] Edema Control - Lymphedema / SCD / Other Elevate legs to the level of the heart or above for 30 minutes daily and/or when sitting, a frequency of: Avoid standing for long periods of time. Additional Orders / Instructions Follow Nutritious Diet Wound Treatment Wound #1 - Lower Leg Wound Laterality: Left, Anterior Cleanser: Soap and Water 1 x Per Week/30 Days Discharge Instructions: May shower and wash wound with dial antibacterial soap and water prior to dressing change. Cleanser: Wound Cleanser 1 x Per Week/30 Days Discharge Instructions: Cleanse the wound with wound cleanser prior to applying a clean dressing using gauze sponges, not tissue or cotton balls. Peri-Wound Care: Triamcinolone 15 (g) 1 x Per Week/30 Days Discharge  Instructions: Use triamcinolone 15 (g) as directed Peri-Wound Care: Sween Lotion (Moisturizing lotion) 1 x Per Week/30 Days Discharge Instructions: Apply moisturizing lotion as directed Topical: Gentamicin 1 x Per Week/30 Days Discharge Instructions: As directed by physician Prim Dressing: KerraCel Ag Gelling Fiber Dressing, 4x5 in (silver alginate) 1 x Per Week/30 Days ary Discharge Instructions: Apply silver alginate to wound bed as instructed Secondary Dressing: Woven Gauze Sponge, Non-Sterile 4x4 in 1 x Per Week/30 Days Discharge Instructions: Apply over primary dressing as directed. Secondary Dressing: ABD Pad, 5x9 1 x Per Week/30 Days Discharge Instructions: Apply over primary dressing as directed. Compression Wrap: Kerlix Roll 4.5x3.1 (in/yd) 1 x Per Week/30 Days Discharge Instructions: Apply Kerlix and Coban compression as directed. Compression Wrap: Coban Self-Adherent Wrap 4x5 (in/yd) 1 x Per Week/30 Days Discharge Instructions: Apply over Kerlix as directed. Wound #2 - Lower Leg Wound Laterality: Left, Anterior, Proximal Cleanser: Soap and Water 1 x Per Week/30 Days Discharge Instructions: May shower and wash wound with dial antibacterial soap and water prior to dressing change. Cleanser: Wound Cleanser 1 x Per Week/30 Days Discharge Instructions: Cleanse the wound with wound cleanser prior to applying a clean dressing using gauze sponges, not tissue or cotton balls. Peri-Wound Care: Triamcinolone 15 (g) 1 x Per Week/30 Days Discharge Instructions: Use triamcinolone 15 (g) as directed Peri-Wound Care: Sween Lotion (Moisturizing lotion) 1 x Per Week/30 Days Discharge Instructions: Apply moisturizing lotion as directed Topical: Gentamicin 1 x Per Week/30 Days Discharge Instructions: As directed by physician Prim Dressing: KerraCel Ag Gelling Fiber Dressing, 4x5 in (silver alginate) 1 x Per Week/30 Days ary Discharge Instructions: Apply silver alginate to wound bed as  instructed Secondary Dressing: Woven Gauze Sponge, Non-Sterile 4x4 in 1 x Per Week/30 Days Discharge Instructions: Apply over primary dressing as directed. Secondary Dressing: ABD Pad, 5x9 1 x Per Week/30 Days Discharge Instructions:  Apply over primary dressing as directed. Compression Wrap: Kerlix Roll 4.5x3.1 (in/yd) 1 x Per Week/30 Days Discharge Instructions: Apply Kerlix and Coban compression as directed. Compression Wrap: Coban Self-Adherent Wrap 4x5 (in/yd) 1 x Per Week/30 Days Discharge Instructions: Apply over Kerlix as directed. Consults Plastic Surgery - Refer to Plastic Surgery for possible OR debridement; non-healing Left leg wound with calcifications Electronic Signature(s) Signed: 03/17/2021 4:35:26 PM By: Diane Ham MD Signed: 03/19/2021 5:37:29 PM By: Rhae Hammock RN Entered By: Rhae Hammock on 03/17/2021 14:18:37 Prescription 03/17/2021 -------------------------------------------------------------------------------- Priscella Mann MD Patient Name: Provider: [birth year] [id number] Date of Birth: NPI#Wanda Plump [id number] Sex: DEA #: [phone] [id number] Phone #: License #: [place] Patient Address: [address] [address] , [place], [place] [address] [phone] Allergies Sulfa (Sulfonamide Antibiotics); ciprofloxacin; lovastatin; sulfamethoxazole; Mevacor Provider's Orders Plastic Surgery - Refer to Plastic Surgery for possible OR debridement; non-healing Left leg wound with calcifications Hand Signature: Date(s): Electronic Signature(s) Signed: 03/17/2021 4:35:26 PM By: Diane Ham MD Signed: 03/19/2021 5:37:29 PM By: Rhae Hammock RN Entered By: Rhae Hammock on 03/17/2021 14:18:37 -------------------------------------------------------------------------------- Problem List Details Patient Name: Date of Service: Diane Ano.  03/17/2021 1:00 PM Medical Record Number: [id number] Patient Account Number: [id number] Date of Birth/Sex: Treating RN: 1935-10-28 (86 y.o. 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 03/17/2021 1:00 PM Medical Record Number: [id number] Patient Account Number: [id number] Date of Birth/Sex: Treating RN: Apr 01, 1935 (86 y.o. [name] Primary Care Provider: Patrecia Harper Other Clinician: Referring Provider: Treating Provider/Extender: [name] in Treatment: 2 Subjective History of Present Illness (HPI) ADMISSION 03/03/2021 This is a 86 year old woman who tells me she fell 13 years ago. She had lumps on her legs since then. There was never an open wound at the time. These "lumps" were hard but nonpainful. She was seen by dermatology at [facility] dermatology who did she [name] in September who performed a punch biopsy on 1 of these. We do not have these results. The area was sutured and then sometime later the sutures were removed and the wound dehisced. She developed redness and swelling in the area and received a course of Vantin as well as prior courses of  Augmentin and doxycycline. A culture done at the end of December showed Corynebacterium which is a common skin contaminant and unlikely to have been a true pathogen. She is here for review of this. She has been using Silvadene cream Past medical history includes migraines hypertension arthritis she is a prediabetic with a last hemoglobin of 6 apparently to have bladder surgery early in February ABI in the left leg was not obtained Because of1/17/2023; patient admitted to the clinic last week. 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 This underwent a biopsy by [facility] dermatology. The biopsy showed traumatic panniculitis and identified areas of  fat necrosis calcification and old areas of hemorrhage. She arrived in the clinic with a probing area with underlying calcification. Swallowing I put this in compression. PCR culture showed Enterobacter and Actinotignum. The latter of which I never really heard of researching shows a cause of UTIs in older adults. Treatment is 2 weeks of a beta-lactam antibiotic. I started the patient on cefdinir for the Enterobacter. She is allergic to quinolones and sulfa She continues to have erythema around the wound I marked the margins of this last time no different this time. She says dermatology injected this area with steroids in order to control this I do not know that its done anything 1/24; culture I did last time showed it once again Enterobacter cloacae I. The cefdinir that I gave her should have covered this we are also using topical gentamicin. Unfortunately she arrives with the wound looking somewhat worse. She has original wound the satellite wound and several small satellite wounds around this. Each 1 of these has underlying calcification. I spoken to the patient the length of her initial hard area was about the same length is what is opening now.. The erythema around the wound however looks somewhat  better Objective Constitutional Patient is hypertensive.. Pulse regular and within target range for patient.Marland Kitchen Respirations regular, non-labored and within target range.. Temperature is normal and within the target range for the patient.Marland Kitchen Appears in no distress. Vitals Time Taken: 1:29 PM, Temperature: 98.4 F, Pulse: 68 bpm, Respiratory Rate: 17 breaths/min, Blood Pressure: 184/84 mmHg. General Notes: Wound exam; left medial calf punched-out area with underlying calcification the satellite area that we identified last time also has expanded and has underlying calcifications she has several areas around this. There is inflammation around the open areas but the erythematous area that we marked last time is somewhat better. Integumentary (Hair, Skin) Wound #1 status is Open. Original cause of wound was Surgical Injury. The date acquired was: 11/04/2020. The wound has been in treatment 2 weeks. The wound is located on the Left,Anterior Lower Leg. The wound measures 2cm length x 2cm width x 0.7cm depth; 3.142cm^2 area and 2.199cm^3 volume. There is Fat Layer (Subcutaneous Tissue) exposed. There is no tunneling noted, however, there is undermining starting at 12:00 and ending at 12:00 with a maximum distance of 1cm. There is a medium amount of serosanguineous drainage noted. The wound margin is well defined and not attached to the wound base. There is medium (34-66%) pink granulation within the wound bed. There is a medium (34-66%) amount of necrotic tissue within the wound bed including Adherent Slough. Wound #2 status is Open. Original cause of wound was Gradually Appeared. The date acquired was: 03/10/2021. The wound has been in treatment 1 weeks. The wound is located on the Left,Proximal,Anterior Lower Leg. The wound measures 1.5cm length x 1.7cm width x 0.5cm depth; 2.003cm^2 area and 1.001cm^3 volume. There is Fat Layer (Subcutaneous Tissue) exposed. There is no tunneling noted, however, there is  undermining starting at 4:00 and ending at 7:00 with a maximum distance of 0.4cm. There is a medium amount of purulent drainage noted. Foul odor after cleansing was noted. The wound margin is well defined and not attached to the wound base. There is large (67-100%) red granulation within the wound bed. There is no necrotic tissue within the wound bed. 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 - with Dr. Arcola Jansky Shower/ Hygiene: May shower with protection but do not get wound dressing(s) wet. - May use cast protector bag. Can get at [facility] or [facility] Edema Control - Lymphedema / SCD / Other: Elevate legs to the level of the heart or above for 30 minutes daily and/or when sitting, a frequency of: Avoid standing for long periods of time. Additional Orders / Instructions: Follow Nutritious Diet Consults ordered were: Plastic Surgery - Refer to Plastic Surgery for possible OR debridement; non-healing Left leg wound with calcifications WOUND #1: - Lower Leg Wound Laterality: Left, Anterior Cleanser: Soap and Water 1 x Per Week/30 Days Discharge Instructions: May shower and wash wound with dial antibacterial soap and water prior to dressing change. Cleanser: Wound Cleanser 1 x Per Week/30 Days Discharge Instructions: Cleanse the wound with wound cleanser prior to applying a clean dressing using gauze sponges, not tissue or cotton balls. Peri-Wound Care: Triamcinolone 15 (g) 1 x Per Week/30 Days Discharge Instructions: Use triamcinolone 15 (g) as directed Peri-Wound Care: Sween Lotion (Moisturizing lotion) 1 x Per Week/30 Days Discharge Instructions: Apply moisturizing lotion as directed Topical: Gentamicin 1 x Per Week/30 Days Discharge Instructions: As  directed by physician Prim Dressing: KerraCel Ag Gelling Fiber Dressing, 4x5 in (silver alginate) 1 x Per Week/30 Days ary Discharge Instructions: Apply silver alginate to wound bed as instructed Secondary Dressing: Woven Gauze Sponge, Non-Sterile 4x4 in 1 x Per Week/30 Days Discharge Instructions: Apply over primary dressing as directed. Secondary Dressing: ABD Pad, 5x9 1 x Per Week/30 Days Discharge Instructions: Apply over primary dressing as directed. Com pression Wrap: Kerlix Roll 4.5x3.1 (in/yd) 1 x Per Week/30 Days Discharge Instructions: Apply Kerlix and Coban compression as directed. Com pression Wrap: Coban Self-Adherent Wrap 4x5 (in/yd) 1 x Per Week/30 Days Discharge Instructions: Apply over Kerlix as directed. WOUND #2: - Lower Leg Wound Laterality: Left, Anterior, Proximal Cleanser: Soap and Water 1 x Per Week/30 Days Discharge Instructions: May shower and wash wound with dial antibacterial soap and water prior to dressing change. Cleanser: Wound Cleanser 1 x Per Week/30 Days Discharge Instructions: Cleanse the wound with wound cleanser prior to applying a clean dressing using gauze sponges, not tissue or cotton balls. Peri-Wound Care: Triamcinolone 15 (g) 1 x Per Week/30 Days Discharge Instructions: Use triamcinolone 15 (g) as directed Peri-Wound Care: Sween Lotion (Moisturizing lotion) 1 x Per Week/30 Days Discharge Instructions: Apply moisturizing lotion as directed Topical: Gentamicin 1 x Per Week/30 Days Discharge Instructions: As directed by physician Prim Dressing: KerraCel Ag Gelling Fiber Dressing, 4x5 in (silver alginate) 1 x Per Week/30 Days ary Discharge Instructions: Apply silver alginate to wound bed as instructed Secondary Dressing: Woven Gauze Sponge, Non-Sterile 4x4 in 1 x Per Week/30 Days Discharge Instructions: Apply over primary dressing as directed. Secondary Dressing: ABD Pad, 5x9 1 x Per Week/30 Days Discharge Instructions: Apply over primary dressing  as directed. Com pression Wrap: Kerlix Roll 4.5x3.1 (in/yd) 1 x Per Week/30 Days Discharge Instructions: Apply Kerlix and Coban compression as directed. Com pression Wrap: Coban Self-Adherent Wrap 4x5 (in/yd) 1 x Per Week/30 Days Discharge Instructions: Apply over Kerlix as directed. 1. Things are deteriorating although there is less erythema around the area. 2. The Enterobacter cloacae I should have been covered by the cefdinir she was already on. We are using topical gentamicin as well 3. I am thinking that this entire area may need an operative debridement and for that reason I am going to send her to  see Dr. Claudia Desanctis. I think this is more than I can do in the clinic 4. Continue gentamicin and silver alginate 5. She has only 4 pills of Cefdinir on her left and I think that is good enough she has had several rounds of antibiotics. I think the inflammation itself is due to the calcification underneath. The deterioration may be because the compression was excessive and I have reduced that the [employer]) Signed: 03/17/2021 4:35:26 PM By: Diane Ham MD Entered By: [name] on 03/17/2021 14:25:40 -------------------------------------------------------------------------------- SuperBill Details Patient Name: Date of Service: Diane Ano 03/17/2021 Medical Record Number: [id number] Patient Account Number: [id number] Date of Birth/Sex: Treating RN: [birth year] (86 y.o. 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 Facility Procedures CPT4 Code: [id number] Description:  99213 - WOUND CARE VISIT-LEV 3 EST PT Modifier: Quantity: 1 Physician Procedures : CPT4 Code Description Modifier [id number] - WC PHYS LEVEL 4 - EST PT ICD-10 Diagnosis Description T81.31XD Disruption of external operation (surgical) wound, not elsewhere classified, subsequent encounter L97.228 Non-pressure chronic ulcer of left  calf with other specified severity 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

## 2021-03-24 ENCOUNTER — Encounter (HOSPITAL_BASED_OUTPATIENT_CLINIC_OR_DEPARTMENT_OTHER): Payer: Medicare HMO | Admitting: Internal Medicine

## 2021-03-24 ENCOUNTER — Other Ambulatory Visit: Payer: Self-pay

## 2021-03-24 DIAGNOSIS — L97228 Non-pressure chronic ulcer of left calf with other specified severity: Secondary | ICD-10-CM | POA: Diagnosis not present

## 2021-03-25 NOTE — Progress Notes (Signed)
AMIA, RYNDERS ([id number]) Visit Report for 03/24/2021 Arrival Information Details Patient Name: Date of Service: [name], YEARICK 03/24/2021 1:00 PM Medical Record Number: [id number] Patient Account Number: [id number] Date of Birth/Sex: Treating RN: 04-Jul-1935 (86 y.o. [name] Primary Care Donato Studley: Patrecia Pace Other Clinician: Referring Khalessi Blough: Treating Letetia Romanello/Extender: Donita Brooks in Treatment: 3 Visit Information History Since Last Visit Added or deleted any medications: No Patient Arrived: Ambulatory Any new allergies or adverse reactions: No Arrival Time: 12:50 Had a fall or experienced change in No Accompanied By: self activities of daily living that may affect Transfer Assistance: None risk of falls: Patient Identification Verified: Yes Hospitalized since last visit: No Secondary Verification Process Completed: Yes Implantable device outside of the clinic excluding No Patient Requires Transmission-Based Precautions: No cellular tissue based products placed in the center Patient Has Alerts: Yes since last visit: Patient Alerts: L ABI: Non Comp Has Dressing in Place as Prescribed: Yes Pain Present Now: No Electronic Signature(s) Signed: 03/25/2021 9:58:51 AM By: Sandre Kitty Entered By: Sandre Kitty on 03/24/2021 12:50:42 -------------------------------------------------------------------------------- Encounter Discharge Information Details Patient Name: Date of Service: Diane Ano. 03/24/2021 1:00 PM Medical Record Number: [id number] Patient Account Number: [id number] Date of Birth/Sex: Treating RN: 1935/03/07 (86 y.o. [name], [name] Primary Care Dhana Totton: Patrecia Pace Other Clinician: Referring Reizy Dunlow: Treating Kiaria Quinnell/Extender: Donita Brooks in Treatment: 3 Encounter Discharge Information Items Discharge Condition: Stable Ambulatory Status: Ambulatory Discharge  Destination: Home Transportation: Private Auto Accompanied By: self Schedule Follow-up Appointment: Yes Clinical Summary of Care: Patient Declined Electronic Signature(s) Signed: 03/25/2021 4:37:24 PM By: Rhae Hammock RN Entered By: Rhae Hammock on 03/24/2021 13:11:55 -------------------------------------------------------------------------------- Lower Extremity Assessment Details Patient Name: Date of Service: Diane Ano. 03/24/2021 1:00 PM Medical Record Number: [id number] Patient Account Number: [id number] Date of Birth/Sex: Treating RN: 1935/06/23 (86 y.o. [name], [name] Primary Care Geanette Buonocore: Patrecia Pace Other Clinician: Referring Antoinne Spadaccini: Treating Lilyann Gravelle/Extender: Donita Brooks in Treatment: 3 Edema Assessment Assessed: Shirlyn Goltz: Yes] Patrice Paradise: No] Edema: [Left: Ye] [Right: s] Calf Left: Right: Point of Measurement: 28 cm From Medial Instep 40 cm Ankle Left: Right: Point of Measurement: 8 cm From Medial Instep 22 cm Vascular Assessment Pulses: Dorsalis Pedis Palpable: [Left:Yes] Posterior Tibial Palpable: [Left:Yes] Electronic Signature(s) Signed: 03/25/2021 4:37:24 PM By: Rhae Hammock RN Entered By: Rhae Hammock on 03/24/2021 13:08:46 -------------------------------------------------------------------------------- Multi Wound Chart Details Patient Name: Date of Service: Diane Ano. 03/24/2021 1:00 PM Medical Record Number: [id number] Patient Account Number: [id number] Date of Birth/Sex: Treating RN: [birth year] (86 y.o. [name] Primary Care Ozell Juhasz: Patrecia Pace Other Clinician: Referring Brunetta Newingham: Treating Clotilde Loth/Extender: Donita Brooks in Treatment: 3 Vital Signs Height(in): Pulse(bpm): 89 Weight(lbs): Blood Pressure(mmHg): 150/65 Body Mass Index(BMI): Temperature(F): 98.3 Respiratory Rate(breaths/min): 17 Photos: [1:Left, Anterior Lower Leg]  [2:Left, Proximal, Anterior Lower Leg] [N/A:N/A N/A] Wound Location: [1:Surgical Injury] [2:Gradually Appeared] [N/A:N/A] Wounding Event: [1:Infection - not elsewhere classified] [2:Atypical] [N/A:N/A] Primary Etiology: [1:Cataracts, Hypertension,] [2:Cataracts, Hypertension,] [N/A:N/A] Comorbid History: [1:Osteoarthritis 11/04/2020] [2:Osteoarthritis 03/10/2021] [N/A:N/A] Date Acquired: [1:3] [2:2] [N/A:N/A] Weeks of Treatment: [1:Open] [2:Open] [N/A:N/A] Wound Status: [1:No] [2:No] [N/A:N/A] Wound Recurrence: [1:No] [2:Yes] [N/A:N/A] Clustered Wound: [1:N/A] [2:2] [N/A:N/A] Clustered Quantity: [1:1.2x1x0.3] [2:1.1x1.2x0.3] [N/A:N/A] Measurements L x W x D (cm) [1:0.942] [2:1.037] [N/A:N/A] A (cm) : rea [1:0.283] [2:0.311] [N/A:N/A] Volume (cm) : [1:22.10%] [2:-175.10%] [N/A:N/A] % Reduction in Area: [1:22.00%] [2:-65.40%] [N/A:N/A] % Reduction in Volume: [1:Full Thickness With Exposed Support] [2:Full Thickness Without Exposed] [N/A:N/A] Classification: [1:Structures Medium] [2:Support Structures Medium] [  N/A:N/A] Exudate Amount: [1:Serosanguineous] [2:Purulent] [N/A:N/A] Exudate Type: [1:red, brown] [2:yellow, brown, green] [N/A:N/A] Exudate Color: [1:No] [2:Yes] [N/A:N/A] Foul Odor A Cleansing: [1:fter N/A] [2:No] [N/A:N/A] Odor Anticipated Due to Product Use: [1:Well defined, not attached] [2:Well defined, not attached] [N/A:N/A] Wound Margin: [1:Medium (34-66%)] [2:Large (67-100%)] [N/A:N/A] Granulation A mount: [1:Pink] [2:Red] [N/A:N/A] Granulation Quality: [1:Medium (34-66%)] [2:None Present (0%)] [N/A:N/A] Necrotic Amount: [1:Fat Layer (Subcutaneous Tissue): Yes Fat Layer (Subcutaneous Tissue): Yes N/A] Exposed Structures: [1:Fascia: No Tendon: No Muscle: No Joint: No Bone: No None] [2:Fascia: No Tendon: No Muscle: No Joint: No Bone: No None] [N/A:N/A] Epithelialization: [1:Debridement - Excisional] [2:Debridement - Excisional] [N/A:N/A] Debridement: Pre-procedure  Verification/Time Out 13:15 [2:13:15] [N/A:N/A] Taken: [1:Lidocaine] [2:Lidocaine] [N/A:N/A] Pain Control: [1:Subcutaneous] [2:Subcutaneous] [N/A:N/A] Tissue Debrided: [1:Skin/Subcutaneous Tissue] [2:Skin/Subcutaneous Tissue] [N/A:N/A] Level: [1:1.2] [2:1.32] [N/A:N/A] Debridement A (sq cm): [1:rea Curette] [2:Curette] [N/A:N/A] Instrument: [1:Minimum] [2:Minimum] [N/A:N/A] Bleeding: [1:Pressure] [2:Pressure] [N/A:N/A] Hemostasis A chieved: [1:0] [2:0] [N/A:N/A] Procedural Pain: [1:0] [2:0] [N/A:N/A] Post Procedural Pain: [1:Procedure was tolerated well] [2:Procedure was tolerated well] [N/A:N/A] Debridement Treatment Response: [1:1.2x1x0.3] [2:1.1x1.2x0.3] [N/A:N/A] Post Debridement Measurements L x W x D (cm) [1:0.283] [2:0.311] [N/A:N/A] Post Debridement Volume: (cm) [1:Debridement] [2:N/A] [N/A:N/A] Treatment Notes Wound #1 (Lower Leg) Wound Laterality: Left, Anterior Cleanser Soap and Water Discharge Instruction: May shower and wash wound with dial antibacterial soap and water prior to dressing change. Wound Cleanser Discharge Instruction: Cleanse the wound with wound cleanser prior to applying a clean dressing using gauze sponges, not tissue or cotton balls. Peri-Wound Care Triamcinolone 15 (g) Discharge Instruction: Use triamcinolone 15 (g) as directed Sween Lotion (Moisturizing lotion) Discharge Instruction: Apply moisturizing lotion as directed Topical Gentamicin Discharge Instruction: As directed by physician Primary Dressing KerraCel Ag Gelling Fiber Dressing, 4x5 in (silver alginate) Discharge Instruction: Apply silver alginate to wound bed as instructed Secondary Dressing Woven Gauze Sponge, Non-Sterile 4x4 in Discharge Instruction: Apply over primary dressing as directed. ABD Pad, 5x9 Discharge Instruction: Apply over primary dressing as directed. Secured With Compression Wrap Kerlix Roll 4.5x3.1 (in/yd) Discharge Instruction: Apply Kerlix and Coban compression  as directed. Coban Self-Adherent Wrap 4x5 (in/yd) Discharge Instruction: Apply over Kerlix as directed. Compression Stockings Add-Ons Wound #2 (Lower Leg) Wound Laterality: Left, Anterior, Proximal Cleanser Soap and Water Discharge Instruction: May shower and wash wound with dial antibacterial soap and water prior to dressing change. Wound Cleanser Discharge Instruction: Cleanse the wound with wound cleanser prior to applying a clean dressing using gauze sponges, not tissue or cotton balls. Peri-Wound Care Triamcinolone 15 (g) Discharge Instruction: Use triamcinolone 15 (g) as directed Sween Lotion (Moisturizing lotion) Discharge Instruction: Apply moisturizing lotion as directed Topical Gentamicin Discharge Instruction: As directed by physician Primary Dressing KerraCel Ag Gelling Fiber Dressing, 4x5 in (silver alginate) Discharge Instruction: Apply silver alginate to wound bed as instructed Secondary Dressing Woven Gauze Sponge, Non-Sterile 4x4 in Discharge Instruction: Apply over primary dressing as directed. ABD Pad, 5x9 Discharge Instruction: Apply over primary dressing as directed. Secured With Compression Wrap Kerlix Roll 4.5x3.1 (in/yd) Discharge Instruction: Apply Kerlix and Coban compression as directed. Coban Self-Adherent Wrap 4x5 (in/yd) Discharge Instruction: Apply over Kerlix as directed. Compression Stockings Add-Ons Electronic Signature(s) Signed: 03/24/2021 4:28:46 PM By: Linton Ham MD Signed: 03/24/2021 5:37:15 PM By: Levan Hurst RN, BSN Entered By: Linton Ham on 03/24/2021 13:19:32 -------------------------------------------------------------------------------- Multi-Disciplinary Care Plan Details Patient Name: Date of Service: Diane Ano. 03/24/2021 1:00 PM Medical Record Number: [id number] Patient Account Number: [id number] Date of Birth/Sex: Treating RN: [birth year] (86 y.o. [name], [place] Primary Care Damarion Mendizabal: Randol Kern,  Martie Round Other Clinician: Referring Nadea Kirkland: Treating Mela Perham/Extender: Donita Brooks in Treatment: 3 Active Inactive Venous Leg Ulcer Nursing Diagnoses: Potential for venous Insuffiency (use before diagnosis confirmed) Goals: Patient will maintain optimal edema control Date Initiated: 03/03/2021 Target Resolution Date: 03/31/2021 Goal Status: Active Interventions: Compression as ordered Provide education on venous insufficiency Treatment Activities: Therapeutic compression applied : 03/03/2021 Notes: Wound/Skin Impairment Nursing Diagnoses: Impaired tissue integrity Goals: Patient/caregiver will verbalize understanding of skin care regimen Date Initiated: 03/03/2021 Target Resolution Date: 03/31/2021 Goal Status: Active Ulcer/skin breakdown will have a volume reduction of 30% by week 4 Date Initiated: 03/03/2021 Target Resolution Date: 03/31/2021 Goal Status: Active Interventions: Assess patient/caregiver ability to obtain necessary supplies Assess patient/caregiver ability to perform ulcer/skin care regimen upon admission and as needed Assess ulceration(s) every visit Provide education on ulcer and skin care Treatment Activities: Topical wound management initiated : 03/03/2021 Notes: Electronic Signature(s) Signed: 03/25/2021 4:37:24 PM By: Rhae Hammock RN Entered By: Rhae Hammock on 03/24/2021 13:11:13 -------------------------------------------------------------------------------- Pain Assessment Details Patient Name: Date of Service: Diane Ano. 03/24/2021 1:00 PM Medical Record Number: [id number] Patient Account Number: [id number] Date of Birth/Sex: Treating RN: [birth year] (86 y.o. [name] Primary Care Kery Haltiwanger: Patrecia Pace Other Clinician: Referring Tashina Credit: Treating Adeja Sarratt/Extender: Donita Brooks in Treatment: 3 Active Problems Location of Pain Severity and Description of  Pain Patient Has Paino No Site Locations Pain Management and Medication Current Pain Management: Electronic Signature(s) Signed: 03/24/2021 5:37:15 PM By: Levan Hurst RN, BSN Signed: 03/25/2021 9:58:51 AM By: Sandre Kitty Entered By: Sandre Kitty on 03/24/2021 12:50:53 -------------------------------------------------------------------------------- Patient/Caregiver Education Details Patient Name: Date of Service: Jocson, Christeen M. 1/31/2023andnbsp1:00 PM Medical Record Number: [id number] Patient Account Number: [id number] Date of Birth/Gender: Treating RN: 1935/07/18 (86 y.o. [name], [name] Primary Care Physician: Patrecia Pace Other Clinician: Referring Physician: Treating Physician/Extender: Donita Brooks in Treatment: 3 Education Assessment Education Provided To: Patient Education Topics Provided Venous: Methods: Explain/Verbal Responses: Reinforcements needed, State content correctly Wound/Skin Impairment: Methods: Explain/Verbal Responses: Reinforcements needed, State content correctly Electronic Signature(s) Signed: 03/25/2021 4:37:24 PM By: Rhae Hammock RN Entered By: Rhae Hammock on 03/24/2021 13:09:31 -------------------------------------------------------------------------------- Wound Assessment Details Patient Name: Date of Service: Diane Ano. 03/24/2021 1:00 PM Medical Record Number: [id number] Patient Account Number: [id number] Date of Birth/Sex: Treating RN: 10-15-1935 (86 y.o. [name] Primary Care Taym Twist: Patrecia Pace Other Clinician: Referring Brinae Woods: Treating Teresia Myint/Extender: Donita Brooks in Treatment: 3 Wound Status Wound Number: 1 Primary Etiology: Infection - not elsewhere classified Wound Location: Left, Anterior Lower Leg Wound Status: Open Wounding Event: Surgical Injury Comorbid History: Cataracts, Hypertension, Osteoarthritis Date  Acquired: 11/04/2020 Weeks Of Treatment: 3 Clustered Wound: No Photos Wound Measurements Length: (cm) 1.2 Width: (cm) 1 Depth: (cm) 0.3 Area: (cm) 0.942 Volume: (cm) 0.283 % Reduction in Area: 22.1% % Reduction in Volume: 22% Epithelialization: None Wound Description Classification: Full Thickness With Exposed Support Structures Wound Margin: Well defined, not attached Exudate Amount: Medium Exudate Type: Serosanguineous Exudate Color: red, brown Foul Odor After Cleansing: No Slough/Fibrino Yes Wound Bed Granulation Amount: Medium (34-66%) Exposed Structure Granulation Quality: Pink Fascia Exposed: No Necrotic Amount: Medium (34-66%) Fat Layer (Subcutaneous Tissue) Exposed: Yes Necrotic Quality: Adherent Slough Tendon Exposed: No Muscle Exposed: No Joint Exposed: No Bone Exposed: No Treatment Notes Wound #1 (Lower Leg) Wound Laterality: Left, Anterior Cleanser Soap and Water Discharge Instruction: May shower and wash wound with dial antibacterial soap and water prior to dressing change. Wound Cleanser Discharge Instruction:  Cleanse the wound with wound cleanser prior to applying a clean dressing using gauze sponges, not tissue or cotton balls. Peri-Wound Care Triamcinolone 15 (g) Discharge Instruction: Use triamcinolone 15 (g) as directed Sween Lotion (Moisturizing lotion) Discharge Instruction: Apply moisturizing lotion as directed Topical Gentamicin Discharge Instruction: As directed by physician Primary Dressing KerraCel Ag Gelling Fiber Dressing, 4x5 in (silver alginate) Discharge Instruction: Apply silver alginate to wound bed as instructed Secondary Dressing Woven Gauze Sponge, Non-Sterile 4x4 in Discharge Instruction: Apply over primary dressing as directed. ABD Pad, 5x9 Discharge Instruction: Apply over primary dressing as directed. Secured With Compression Wrap Kerlix Roll 4.5x3.1 (in/yd) Discharge Instruction: Apply Kerlix and Coban compression as  directed. Coban Self-Adherent Wrap 4x5 (in/yd) Discharge Instruction: Apply over Kerlix as directed. Compression Stockings Add-Ons Electronic Signature(s) Signed: 03/24/2021 4:25:26 PM By: Lorrin Jackson Entered By: Lorrin Jackson on 03/24/2021 12:52:56 -------------------------------------------------------------------------------- Wound Assessment Details Patient Name: Date of Service: Diane Ano. 03/24/2021 1:00 PM Medical Record Number: [id number] Patient Account Number: [id number] Date of Birth/Sex: Treating RN: [birth year] (86 y.o. [name] Primary Care Jacey Eckerson: Patrecia Pace Other Clinician: Referring Emberlynn Riggan: Treating Naveen Lorusso/Extender: Donita Brooks in Treatment: 3 Wound Status Wound Number: 2 Primary Etiology: Atypical Wound Location: Left, Proximal, Anterior Lower Leg Wound Status: Open Wounding Event: Gradually Appeared Comorbid History: Cataracts, Hypertension, Osteoarthritis Date Acquired: 03/10/2021 Weeks Of Treatment: 2 Clustered Wound: Yes Photos Wound Measurements Length: (cm) 1.1 Width: (cm) 1.2 Depth: (cm) 0.3 Clustered Quantity: 2 Area: (cm) 1.037 Volume: (cm) 0.311 % Reduction in Area: -175.1% % Reduction in Volume: -65.4% Epithelialization: None Wound Description Classification: Full Thickness Without Exposed Support Structures Wound Margin: Well defined, not attached Exudate Amount: Medium Exudate Type: Purulent Exudate Color: yellow, brown, green Foul Odor After Cleansing: Yes Due to Product Use: No Slough/Fibrino No Wound Bed Granulation Amount: Large (67-100%) Exposed Structure Granulation Quality: Red Fascia Exposed: No Necrotic Amount: None Present (0%) Fat Layer (Subcutaneous Tissue) Exposed: Yes Tendon Exposed: No Muscle Exposed: No Joint Exposed: No Bone Exposed: No Treatment Notes Wound #2 (Lower Leg) Wound Laterality: Left, Anterior, Proximal Cleanser Soap and Water Discharge  Instruction: May shower and wash wound with dial antibacterial soap and water prior to dressing change. Wound Cleanser Discharge Instruction: Cleanse the wound with wound cleanser prior to applying a clean dressing using gauze sponges, not tissue or cotton balls. Peri-Wound Care Triamcinolone 15 (g) Discharge Instruction: Use triamcinolone 15 (g) as directed Sween Lotion (Moisturizing lotion) Discharge Instruction: Apply moisturizing lotion as directed Topical Gentamicin Discharge Instruction: As directed by physician Primary Dressing KerraCel Ag Gelling Fiber Dressing, 4x5 in (silver alginate) Discharge Instruction: Apply silver alginate to wound bed as instructed Secondary Dressing Woven Gauze Sponge, Non-Sterile 4x4 in Discharge Instruction: Apply over primary dressing as directed. ABD Pad, 5x9 Discharge Instruction: Apply over primary dressing as directed. Secured With Compression Wrap Kerlix Roll 4.5x3.1 (in/yd) Discharge Instruction: Apply Kerlix and Coban compression as directed. Coban Self-Adherent Wrap 4x5 (in/yd) Discharge Instruction: Apply over Kerlix as directed. Compression Stockings Add-Ons Electronic Signature(s) Signed: 03/24/2021 4:25:26 PM By: Lorrin Jackson Entered By: Lorrin Jackson on 03/24/2021 12:53:27 -------------------------------------------------------------------------------- Vitals Details Patient Name: Date of Service: Diane Ano. 03/24/2021 1:00 PM Medical Record Number: [id number] Patient Account Number: [id number] Date of Birth/Sex: Treating RN: 05/05/1935 (86 y.o. [name] Primary Care Adri Schloss: Patrecia Pace Other Clinician: Referring Madalynne Gutmann: Treating Philomena Buttermore/Extender: Donita Brooks in Treatment: 3 Vital Signs Time Taken: 12:50 Temperature (F): 98.3 Pulse (bpm): 66 Respiratory Rate (breaths/min): 17 Blood  Pressure (mmHg): 150/65 Reference Range: 80 - 120 mg / dl Electronic  Signature(s) Signed: 03/25/2021 9:58:51 AM By: Sandre Kitty Entered By: Sandre Kitty on 03/24/2021 12:50:25

## 2021-03-25 NOTE — Progress Notes (Signed)
[name], [name] ([id number]) Visit Report for 03/24/2021 Debridement Details Patient Name: Date of Service: [name], [name] 03/24/2021 1:00 PM Medical Record Number: [id number] Patient Account Number: [id number] Date of Birth/Sex: Treating RN: Sep 27, 1935 (86 y.o. [name] Primary Care Provider: Patrecia Pace Other Clinician: Referring Provider: Treating Provider/Extender: Donita Brooks in Treatment: 3 Debridement Performed for Assessment: Wound #1 Left,Anterior Lower Leg Performed By: Physician Ricard Dillon., MD Debridement Type: Debridement Level of Consciousness (Pre-procedure): Awake and Alert Pre-procedure Verification/Time Out Yes - 13:15 Taken: Start Time: 13:15 Pain Control: Lidocaine T Area Debrided (L x W): otal 1.2 (cm) x 1 (cm) = 1.2 (cm) Tissue and other material debrided: Viable, Non-Viable, Subcutaneous, Skin: Dermis , Skin: Epidermis, Other: calcification Level: Skin/Subcutaneous Tissue Debridement Description: Excisional Instrument: Curette Bleeding: Minimum Hemostasis Achieved: Pressure End Time: 13:15 Procedural Pain: 0 Post Procedural Pain: 0 Response to Treatment: Procedure was tolerated well Level of Consciousness (Post- Awake and Alert procedure): Post Debridement Measurements of Total Wound Length: (cm) 1.2 Width: (cm) 1 Depth: (cm) 0.3 Volume: (cm) 0.283 Character of Wound/Ulcer Post Debridement: Improved Post Procedure Diagnosis Same as Pre-procedure Electronic Signature(s) Signed: 03/24/2021 4:28:46 PM By: Linton Ham MD Signed: 03/24/2021 5:37:15 PM By: Levan Hurst RN, BSN Entered By: Linton Ham on 03/24/2021 13:20:45 -------------------------------------------------------------------------------- Debridement Details Patient Name: Date of Service: Diane Ano. 03/24/2021 1:00 PM Medical Record Number: [id number] Patient Account Number: [id number] Date of Birth/Sex: Treating  RN: [birth year] (86 y.o. [name] Primary Care Provider: Patrecia Pace Other Clinician: Referring Provider: Treating Provider/Extender: Donita Brooks in Treatment: 3 Debridement Performed for Assessment: Wound #2 Left,Proximal,Anterior Lower Leg Performed By: Physician Ricard Dillon., MD Debridement Type: Debridement Level of Consciousness (Pre-procedure): Awake and Alert Pre-procedure Verification/Time Out Yes - 13:15 Taken: Start Time: 13:15 Pain Control: Lidocaine T Area Debrided (L x W): otal 1.1 (cm) x 1.2 (cm) = 1.32 (cm) Tissue and other material debrided: Viable, Non-Viable, Subcutaneous, Skin: Dermis , Skin: Epidermis Level: Skin/Subcutaneous Tissue Debridement Description: Excisional Instrument: Curette Bleeding: Minimum Hemostasis Achieved: Pressure End Time: 13:15 Procedural Pain: 0 Post Procedural Pain: 0 Response to Treatment: Procedure was tolerated well Level of Consciousness (Post- Awake and Alert procedure): Post Debridement Measurements of Total Wound Length: (cm) 1.1 Width: (cm) 1.2 Depth: (cm) 0.3 Volume: (cm) 0.311 Character of Wound/Ulcer Post Debridement: Improved Post Procedure Diagnosis Same as Pre-procedure Electronic Signature(s) Signed: 03/24/2021 4:28:46 PM By: Linton Ham MD Signed: 03/24/2021 5:37:15 PM By: Levan Hurst RN, BSN Entered By: Linton Ham on 03/24/2021 13:20:57 -------------------------------------------------------------------------------- HPI Details Patient Name: Date of Service: Diane Ano. 03/24/2021 1:00 PM Medical Record Number: [id number] Patient Account Number: [id number] Date of Birth/Sex: Treating RN: 22-Jan-1936 (86 y.o. [name] Primary Care Provider: Patrecia Pace Other Clinician: Referring Provider: Treating Provider/Extender: Donita Brooks in Treatment: 3 History of Present Illness HPI Description:  ADMISSION 03/03/2021 This is a 86 year old woman who tells me she fell 13 years ago. She had lumps on her legs since then. There was never an open wound at the time. These "lumps" were hard but nonpainful. She was seen by dermatology at [facility] dermatology who did she Dr. Martin Majestic in September who performed a punch biopsy on 1 of these. We do not have these results. The area was sutured and then sometime later the sutures were removed and the wound dehisced. She developed redness and swelling in the area and received a course of Vantin as well as prior courses of Augmentin  and doxycycline. A culture done at the end of December showed Corynebacterium which is a common skin contaminant and unlikely to have been a true pathogen. She is here for review of this. She has been using Silvadene cream Past medical history includes migraines hypertension arthritis she is a prediabetic with a last hemoglobin of 6 apparently to have bladder surgery early in February ABI in the left leg was not obtained Because of1/17/2023; patient admitted to the clinic last week. 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 This underwent a biopsy by [facility] dermatology. The biopsy showed traumatic panniculitis and identified areas of fat necrosis calcification and old areas of hemorrhage. She arrived in the clinic with a probing area with underlying calcification. Swallowing I put this in compression. PCR culture showed Enterobacter and Actinotignum. The latter of which I never really heard of researching shows a cause of UTIs in older adults. Treatment is 2 weeks of a beta-lactam antibiotic. I started the patient on cefdinir for the Enterobacter. She is allergic to quinolones and sulfa She continues to have erythema around the wound I marked the margins of this last time no different this time. She says dermatology injected this area with steroids in order to control this I do not  know that its done anything 1/24; culture I did last time showed it once again Enterobacter cloacae I. The cefdinir that I gave her should have covered this we are also using topical gentamicin. Unfortunately she arrives with the wound looking somewhat worse. She has original wound the satellite wound and several small satellite wounds around this. Each 1 of these has underlying calcification. I spoken to the patient the length of her initial hard area was about the same length is what is opening now.. The erythema around the wound however looks somewhat better 1/31; she has completed antibiotics. 2 open wounds both with exposed calcification. She has an appointment with Dr. Claudia Desanctis of plastic surgery on Friday. She is complaining of less pain Electronic Signature(s) Signed: 03/24/2021 4:28:46 PM By: Linton Ham MD Entered By: Linton Ham on 03/24/2021 13:21:36 -------------------------------------------------------------------------------- Physical Exam Details Patient Name: Date of Service: Diane Ano. 03/24/2021 1:00 PM Medical Record Number: [id number] Patient Account Number: [id number] Date of Birth/Sex: Treating RN: 1935-11-09 (86 y.o. [name] Primary Care Provider: Patrecia Pace Other Clinician: Referring Provider: Treating Provider/Extender: Donita Brooks in Treatment: 3 Constitutional Patient is hypertensive.. Pulse regular and within target range for patient.Marland Kitchen Respirations regular, non-labored and within target range.. Temperature is normal and within the target range for the patient.Marland Kitchen Appears in no distress. Notes Wound exam; left medial calf punched out area both of which have underlying calcifications. She has an area of skin between the 2 wound areas there is probing underneath this they do not actually connect however I do not think this connection is particularly helpful. She had surrounding erythema around the wounds this  seems better. Electronic Signature(s) Signed: 03/24/2021 4:28:46 PM By: Linton Ham MD Entered By: Linton Ham on 03/24/2021 13:22:22 -------------------------------------------------------------------------------- Physician Orders Details Patient Name: Date of Service: Diane Ano. 03/24/2021 1:00 PM Medical Record Number: [id number] Patient Account Number: [id number] Date of Birth/Sex: Treating RN: May 18, 1935 (86 y.o. [name], [name] Primary Care Provider: Patrecia Pace Other Clinician: Referring Provider: Treating Provider/Extender: Donita Brooks in Treatment: 3 Verbal / Phone Orders: No Diagnosis Coding Follow-up Appointments ppointment in 1 week. - with Dr. Dellia Nims Return A Bathing/  Shower/ Hygiene May shower with protection but do not get wound dressing(s) wet. - May use cast protector bag. Can get at [facility] or [facility] Edema Control - Lymphedema / SCD / Other Elevate legs to the level of the heart or above for 30 minutes daily and/or when sitting, a frequency of: Avoid standing for long periods of time. Additional Orders / Instructions Follow Nutritious Diet Wound Treatment Wound #1 - Lower Leg Wound Laterality: Left, Anterior Cleanser: Soap and Water 1 x Per Week/30 Days Discharge Instructions: May shower and wash wound with dial antibacterial soap and water prior to dressing change. Cleanser: Wound Cleanser 1 x Per Week/30 Days Discharge Instructions: Cleanse the wound with wound cleanser prior to applying a clean dressing using gauze sponges, not tissue or cotton balls. Peri-Wound Care: Triamcinolone 15 (g) 1 x Per Week/30 Days Discharge Instructions: Use triamcinolone 15 (g) as directed Peri-Wound Care: Sween Lotion (Moisturizing lotion) 1 x Per Week/30 Days Discharge Instructions: Apply moisturizing lotion as directed Topical: Gentamicin 1 x Per Week/30 Days Discharge Instructions: As directed by physician Prim Dressing:  KerraCel Ag Gelling Fiber Dressing, 4x5 in (silver alginate) 1 x Per Week/30 Days ary Discharge Instructions: Apply silver alginate to wound bed as instructed Secondary Dressing: Woven Gauze Sponge, Non-Sterile 4x4 in 1 x Per Week/30 Days Discharge Instructions: Apply over primary dressing as directed. Secondary Dressing: ABD Pad, 5x9 1 x Per Week/30 Days Discharge Instructions: Apply over primary dressing as directed. Compression Wrap: Kerlix Roll 4.5x3.1 (in/yd) 1 x Per Week/30 Days Discharge Instructions: Apply Kerlix and Coban compression as directed. Compression Wrap: Coban Self-Adherent Wrap 4x5 (in/yd) 1 x Per Week/30 Days Discharge Instructions: Apply over Kerlix as directed. Wound #2 - Lower Leg Wound Laterality: Left, Anterior, Proximal Cleanser: Soap and Water 1 x Per Week/30 Days Discharge Instructions: May shower and wash wound with dial antibacterial soap and water prior to dressing change. Cleanser: Wound Cleanser 1 x Per Week/30 Days Discharge Instructions: Cleanse the wound with wound cleanser prior to applying a clean dressing using gauze sponges, not tissue or cotton balls. Peri-Wound Care: Triamcinolone 15 (g) 1 x Per Week/30 Days Discharge Instructions: Use triamcinolone 15 (g) as directed Peri-Wound Care: Sween Lotion (Moisturizing lotion) 1 x Per Week/30 Days Discharge Instructions: Apply moisturizing lotion as directed Topical: Gentamicin 1 x Per Week/30 Days Discharge Instructions: As directed by physician Prim Dressing: KerraCel Ag Gelling Fiber Dressing, 4x5 in (silver alginate) 1 x Per Week/30 Days ary Discharge Instructions: Apply silver alginate to wound bed as instructed Secondary Dressing: Woven Gauze Sponge, Non-Sterile 4x4 in 1 x Per Week/30 Days Discharge Instructions: Apply over primary dressing as directed. Secondary Dressing: ABD Pad, 5x9 1 x Per Week/30 Days Discharge Instructions: Apply over primary dressing as directed. Compression Wrap: Kerlix  Roll 4.5x3.1 (in/yd) 1 x Per Week/30 Days Discharge Instructions: Apply Kerlix and Coban compression as directed. Compression Wrap: Coban Self-Adherent Wrap 4x5 (in/yd) 1 x Per Week/30 Days Discharge Instructions: Apply over Kerlix as directed. Electronic Signature(s) Signed: 03/24/2021 4:28:46 PM By: Linton Ham MD Signed: 03/25/2021 4:37:24 PM By: Rhae Hammock RN Entered By: Rhae Hammock on 03/24/2021 13:11:01 -------------------------------------------------------------------------------- Problem List Details Patient Name: Date of Service: Diane Ano. 03/24/2021 1:00 PM Medical Record Number: [id number] Patient Account Number: [id number] Date of Birth/Sex: Treating RN: [birth year] (86 y.o. 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 03/24/2021 1:00 PM Medical Record Number: [id number] Patient Account Number: [id number] Date of Birth/Sex: Treating RN: 1935-11-25 (86 y.o. [name] Primary Care Provider: Patrecia Pace Other Clinician: Referring Provider: Treating Provider/Extender: Donita Brooks in Treatment: 3 Subjective History of Present Illness  (HPI) ADMISSION 03/03/2021 This is a 86 year old woman who tells me she fell 13 years ago. She had lumps on her legs since then. There was never an open wound at the time. These "lumps" were hard but nonpainful. She was seen by dermatology at [facility] dermatology who did she Dr. Martin Majestic in September who performed a punch biopsy on 1 of these. We do not have these results. The area was sutured and then sometime later the sutures were removed and the wound dehisced. She developed redness and swelling in the area and received a course of Vantin as well as prior courses of Augmentin and doxycycline. A culture done at the end of December showed Corynebacterium which is a common skin contaminant and unlikely to have been a true pathogen. She is here for review of this. She has been using Silvadene cream Past medical history includes migraines hypertension arthritis she is a prediabetic with a last hemoglobin of 6 apparently to have bladder surgery early in February ABI in the left leg was not obtained Because of1/17/2023; patient admitted to the clinic last week. 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 This underwent a biopsy by [facility] dermatology. The biopsy showed traumatic panniculitis and identified areas of fat necrosis calcification and old areas of hemorrhage. She arrived in the clinic with a probing area with underlying calcification. Swallowing I put this in compression. PCR culture showed Enterobacter and Actinotignum. The latter of which I never really heard of researching shows a cause of UTIs in older adults. Treatment is 2 weeks of a beta-lactam antibiotic. I started the patient on cefdinir for the Enterobacter. She is allergic to quinolones and sulfa She continues to have erythema around the wound I marked the margins of this last time no different this time. She says dermatology injected this area with steroids in order to control this I  do not know that its done anything 1/24; culture I did last time showed it once again Enterobacter cloacae I. The cefdinir that I gave her should have covered this we are also using topical gentamicin. Unfortunately she arrives with the wound looking somewhat worse. She has original wound the satellite wound and several small satellite wounds around this. Each 1 of these has underlying calcification. I spoken to the patient the length of her initial hard area was about the same length is what is opening now.. The erythema around the wound however looks somewhat better 1/31; she has completed antibiotics. 2 open wounds both with exposed calcification. She has an appointment with Dr. Claudia Desanctis of plastic surgery on Friday. She is complaining of less pain Objective Constitutional Patient is hypertensive.. Pulse regular and within target range for patient.Marland Kitchen Respirations regular, non-labored and within target range.. Temperature is normal and within the target range for the patient.Marland Kitchen Appears  in no distress. Vitals Time Taken: 12:50 PM, Temperature: 98.3 F, Pulse: 66 bpm, Respiratory Rate: 17 breaths/min, Blood Pressure: 150/65 mmHg. General Notes: Wound exam; left medial calf punched out area both of which have underlying calcifications. She has an area of skin between the 2 wound areas there is probing underneath this they do not actually connect however I do not think this connection is particularly helpful. She had surrounding erythema around the wounds this seems better. Integumentary (Hair, Skin) Wound #1 status is Open. Original cause of wound was Surgical Injury. The date acquired was: 11/04/2020. The wound has been in treatment 3 weeks. The wound is located on the Left,Anterior Lower Leg. The wound measures 1.2cm length x 1cm width x 0.3cm depth; 0.942cm^2 area and 0.283cm^3 volume. There is Fat Layer (Subcutaneous Tissue) exposed. There is a medium amount of serosanguineous drainage noted. The  wound margin is well defined and not attached to the wound base. There is medium (34-66%) pink granulation within the wound bed. There is a medium (34-66%) amount of necrotic tissue within the wound bed including Adherent Slough. Wound #2 status is Open. Original cause of wound was Gradually Appeared. The date acquired was: 03/10/2021. The wound has been in treatment 2 weeks. The wound is located on the Left,Proximal,Anterior Lower Leg. The wound measures 1.1cm length x 1.2cm width x 0.3cm depth; 1.037cm^2 area and 0.311cm^3 volume. There is Fat Layer (Subcutaneous Tissue) exposed. There is a medium amount of purulent drainage noted. Foul odor after cleansing was noted. The wound margin is well defined and not attached to the wound base. There is large (67-100%) red granulation within the wound bed. There is no necrotic tissue within the wound bed. 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 There was a Excisional Skin/Subcutaneous Tissue Debridement with a total area of 1.2 sq cm performed by Ricard Dillon., MD. With the following instrument(s): Curette to remove Viable and Non-Viable tissue/material. Material removed includes Subcutaneous Tissue, Skin: Dermis, Skin: Epidermis, and Other: calcification after achieving pain control using Lidocaine. No specimens were taken. A time out was conducted at 13:15, prior to the start of the procedure. A Minimum amount of bleeding was controlled with Pressure. The procedure was tolerated well with a pain level of 0 throughout and a pain level of 0 following the procedure. Post Debridement Measurements: 1.2cm length x 1cm width x 0.3cm depth;  0.283cm^3 volume. Character of Wound/Ulcer Post Debridement is improved. Post procedure Diagnosis Wound #1: Same as Pre-Procedure Wound #2 Pre-procedure diagnosis of Wound #2 is an Atypical located on the Left,Proximal,Anterior Lower Leg . There was a Excisional Skin/Subcutaneous Tissue Debridement with a total area of 1.32 sq cm performed by Ricard Dillon., MD. With the following instrument(s): Curette to remove Viable and Non-Viable tissue/material. Material removed includes Subcutaneous Tissue, Skin: Dermis, and Skin: Epidermis after achieving pain control using Lidocaine. No specimens were taken. A time out was conducted at 13:15, prior to the start of the procedure. A Minimum amount of bleeding was controlled with Pressure. The procedure was tolerated well with a pain level of 0 throughout and a pain level of 0 following the procedure. Post Debridement Measurements: 1.1cm length x 1.2cm width x 0.3cm depth; 0.311cm^3 volume. Character of Wound/Ulcer Post Debridement is  improved. Post procedure Diagnosis Wound #2: Same as Pre-Procedure Plan Follow-up Appointments: Return Appointment in 1 week. - with Dr. Arcola Jansky Shower/ Hygiene: May shower with protection but do not get wound dressing(s) wet. - May use cast protector bag. Can get at [facility] or [facility] Edema Control - Lymphedema / SCD / Other: Elevate legs to the level of the heart or above for 30 minutes daily and/or when sitting, a frequency of: Avoid standing for long periods of time. Additional Orders / Instructions: Follow Nutritious Diet WOUND #1: - Lower Leg Wound Laterality: Left, Anterior Cleanser: Soap and Water 1 x Per Week/30 Days Discharge Instructions: May shower and wash wound with dial antibacterial soap and water prior to dressing change. Cleanser: Wound Cleanser 1 x Per Week/30 Days Discharge Instructions: Cleanse the wound with wound cleanser prior to applying a clean dressing using gauze sponges, not tissue  or cotton balls. Peri-Wound Care: Triamcinolone 15 (g) 1 x Per Week/30 Days Discharge Instructions: Use triamcinolone 15 (g) as directed Peri-Wound Care: Sween Lotion (Moisturizing lotion) 1 x Per Week/30 Days Discharge Instructions: Apply moisturizing lotion as directed Topical: Gentamicin 1 x Per Week/30 Days Discharge Instructions: As directed by physician Prim Dressing: KerraCel Ag Gelling Fiber Dressing, 4x5 in (silver alginate) 1 x Per Week/30 Days ary Discharge Instructions: Apply silver alginate to wound bed as instructed Secondary Dressing: Woven Gauze Sponge, Non-Sterile 4x4 in 1 x Per Week/30 Days Discharge Instructions: Apply over primary dressing as directed. Secondary Dressing: ABD Pad, 5x9 1 x Per Week/30 Days Discharge Instructions: Apply over primary dressing as directed. Com pression Wrap: Kerlix Roll 4.5x3.1 (in/yd) 1 x Per Week/30 Days Discharge Instructions: Apply Kerlix and Coban compression as directed. Com pression Wrap: Coban Self-Adherent Wrap 4x5 (in/yd) 1 x Per Week/30 Days Discharge Instructions: Apply over Kerlix as directed. WOUND #2: - Lower Leg Wound Laterality: Left, Anterior, Proximal Cleanser: Soap and Water 1 x Per Week/30 Days Discharge Instructions: May shower and wash wound with dial antibacterial soap and water prior to dressing change. Cleanser: Wound Cleanser 1 x Per Week/30 Days Discharge Instructions: Cleanse the wound with wound cleanser prior to applying a clean dressing using gauze sponges, not tissue or cotton balls. Peri-Wound Care: Triamcinolone 15 (g) 1 x Per Week/30 Days Discharge Instructions: Use triamcinolone 15 (g) as directed Peri-Wound Care: Sween Lotion (Moisturizing lotion) 1 x Per Week/30 Days Discharge Instructions: Apply moisturizing lotion as directed Topical: Gentamicin 1 x Per Week/30 Days Discharge Instructions: As directed by physician Prim Dressing: KerraCel Ag Gelling Fiber Dressing, 4x5 in (silver alginate) 1 x Per  Week/30 Days ary Discharge Instructions: Apply silver alginate to wound bed as instructed Secondary Dressing: Woven Gauze Sponge, Non-Sterile 4x4 in 1 x Per Week/30 Days Discharge Instructions: Apply over primary dressing as directed. Secondary Dressing: ABD Pad, 5x9 1 x Per Week/30 Days Discharge Instructions: Apply over primary dressing as directed. Com pression Wrap: Kerlix Roll 4.5x3.1 (in/yd) 1 x Per Week/30 Days Discharge Instructions: Apply Kerlix and Coban compression as directed. Com pression Wrap: Coban Self-Adherent Wrap 4x5 (in/yd) 1 x Per Week/30 Days Discharge Instructions: Apply over Kerlix as directed. 1. We have been using silver alginate. ABDs and kerlix. 2. Sees Dr. Claudia Desanctis on Friday 3. I used a #3 curette to remove surface calcifications although so far this has not helped to advance the healing process 4. I am wondering about an operative debridement Electronic Signature(s) Signed: 03/24/2021 4:28:46 PM By: Linton Ham MD Entered By: Linton Ham on 03/24/2021 13:23:49 -------------------------------------------------------------------------------- SuperBill Details  Patient Name: Date of Service: [name], [name] 03/24/2021 Medical Record Number: [id number] Patient Account Number: [id number] Date of Birth/Sex: Treating RN: 06/24/1935 (86 y.o. 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

## 2021-03-27 ENCOUNTER — Other Ambulatory Visit: Payer: Self-pay

## 2021-03-27 ENCOUNTER — Ambulatory Visit: Payer: Medicare HMO | Admitting: Plastic Surgery

## 2021-03-27 VITALS — BP 178/83 | HR 67 | Ht 60.0 in | Wt 153.0 lb

## 2021-03-27 DIAGNOSIS — T148XXA Other injury of unspecified body region, initial encounter: Secondary | ICD-10-CM | POA: Diagnosis not present

## 2021-03-27 NOTE — Progress Notes (Signed)
° °  Referring Provider Joya Gaskins, [address],  [place] [address]   CC:  Left lower extremity wound   [name] is an 86 y.o. female.  HPI: Patient is a 86 year old with a left lower extremity wound.  She states this started 13 years ago when she fell and had a lump there.  She has had biopsies with [place] that she states were benign.  She states that her ABIs have been checked in the past but I was unable to find record of this.  She is a non-smoker.  Allergies  Allergen Reactions   Sulfonamide Derivatives    Ciprofloxacin Itching and Rash   Lovastatin Itching and Rash   Sulfamethoxazole Itching and Rash    Outpatient Encounter Medications as of 03/27/2021  Medication Sig   hydrALAZINE (APRESOLINE) 100 MG tablet    isosorbide mononitrate (IMDUR) 30 MG 24 hr tablet    lisinopril (ZESTRIL) 20 MG tablet Take 20 mg by mouth daily.   metoprolol tartrate (LOPRESSOR) 50 MG tablet    pravastatin (PRAVACHOL) 40 MG tablet    No facility-administered encounter medications on file as of 03/27/2021.    Past medical history: Migraine headache Hypertension Osteoarthritis Hyperlipidemia Headache Nephrolithiasis   Family history: No family history of clotting disorder   Social history: No tobacco use no illicit drug use   Review of Systems General: Denies fevers, chills, weight loss CV: Denies chest pain, shortness of breath, palpitations  Physical Exam Vitals with BMI 03/27/2021 12/25/2009 08/22/2009  Height 5\' 0"  - -  Weight 153 lbs 164 lbs 162 lbs  BMI 95.63 - -  Systolic 875 643 329  Diastolic 83 80 70  Pulse 67 99 60    General:  No acute distress,  Alert and oriented, Non-Toxic, Normal speech and affect Extremity: Left lower extremity with 2 wounds superior wound is 2 x 0.7 cm inferior wound is 1.2 x 1.5 cm both show fat in the wound bed.  Assessment/Plan Assessment and plan: Left leg chronic wound that been there for many years.  She been  referred to the wound center because they have not healed with conservative treatment.  We discussed the plan of operative debridement to make this chronic wound into an acute wound and possibly place a skin graft substitute to encourage healing.  Additionally, since this wound is a site of an old trauma it would beneficial to send a biopsy to pathology to make sure this is not a more Marjolin's ulcer   45 minutes were spent with the patient.  Time was spent reviewing records, discussing surgical procedures with the patient and reviewing risks and benefits.  We discussed risks and benefits of left lower extremity wound surgery Lennice Sites 03/28/2021, 1:49 PM

## 2021-03-31 ENCOUNTER — Other Ambulatory Visit: Payer: Self-pay

## 2021-03-31 ENCOUNTER — Encounter (HOSPITAL_BASED_OUTPATIENT_CLINIC_OR_DEPARTMENT_OTHER): Payer: Medicare HMO | Attending: Internal Medicine | Admitting: Internal Medicine

## 2021-03-31 DIAGNOSIS — L97228 Non-pressure chronic ulcer of left calf with other specified severity: Secondary | ICD-10-CM | POA: Insufficient documentation

## 2021-03-31 DIAGNOSIS — Y838 Other surgical procedures as the cause of abnormal reaction of the patient, or of later complication, without mention of misadventure at the time of the procedure: Secondary | ICD-10-CM | POA: Insufficient documentation

## 2021-03-31 DIAGNOSIS — Z96651 Presence of right artificial knee joint: Secondary | ICD-10-CM | POA: Insufficient documentation

## 2021-03-31 DIAGNOSIS — T8131XA Disruption of external operation (surgical) wound, not elsewhere classified, initial encounter: Secondary | ICD-10-CM | POA: Insufficient documentation

## 2021-03-31 DIAGNOSIS — L942 Calcinosis cutis: Secondary | ICD-10-CM | POA: Diagnosis not present

## 2021-03-31 NOTE — Progress Notes (Signed)
[name], [name] ([id number]) Visit Report for 03/31/2021 Arrival Information Details Patient Name: Date of Service: [name], [name] 03/31/2021 2:30 PM Medical Record Number: [id number] Patient Account Number: [id number] Date of Birth/Sex: Treating RN: [birth year] (86 y.o. [name] Primary Care Kimarie Coor: Patrecia Pace Other Clinician: Referring Arelyn Gauer: Treating Montana Bryngelson/Extender: Donita Brooks in Treatment: 4 Visit Information History Since Last Visit Added or deleted any medications: No Patient Arrived: Ambulatory Any new allergies or adverse reactions: No Arrival Time: 14:21 Had a fall or experienced change in No Accompanied By: self activities of daily living that may affect Transfer Assistance: None risk of falls: Patient Requires Transmission-Based Precautions: No Signs or symptoms of abuse/neglect since last visito No Patient Has Alerts: Yes Hospitalized since last visit: No Patient Alerts: L ABI: Non Comp Implantable device outside of the clinic excluding No cellular tissue based products placed in the center since last visit: Has Dressing in Place as Prescribed: Yes Pain Present Now: No Electronic Signature(s) Signed: 03/31/2021 6:18:53 PM By: Dellie Catholic RN Entered By: Dellie Catholic on 03/31/2021 14:28:29 -------------------------------------------------------------------------------- Compression Therapy Details Patient Name: Date of Service: [name] 03/31/2021 2:30 PM Medical Record Number: [id number] Patient Account Number: [id number] Date of Birth/Sex: Treating RN: 14-Nov-1935 (86 y.o. [name] Primary Care Gianny Sabino: Patrecia Pace Other Clinician: Referring Troyce Febo: Treating Teddy Rebstock/Extender: Donita Brooks in Treatment: 4 Compression Therapy Performed for Wound Assessment: Wound #1 Left,Anterior Lower Leg Performed By: Clinician Levan Hurst, RN Compression Type: Three  Layer Post Procedure Diagnosis Same as Pre-procedure Electronic Signature(s) Signed: 03/31/2021 6:14:04 PM By: Levan Hurst RN, BSN Entered By: Levan Hurst on 03/31/2021 18:11:35 -------------------------------------------------------------------------------- Compression Therapy Details Patient Name: Date of Service: [name] 03/31/2021 2:30 PM Medical Record Number: [id number] Patient Account Number: [id number] Date of Birth/Sex: Treating RN: [birth year] (86 y.o. [name] Primary Care Samiksha Pellicano: Patrecia Pace Other Clinician: Referring Gaspar Fowle: Treating Ramey Schiff/Extender: Donita Brooks in Treatment: 4 Compression Therapy Performed for Wound Assessment: Wound #2 Left,Proximal,Anterior Lower Leg Performed By: Clinician Levan Hurst, RN Compression Type: Three Layer Post Procedure Diagnosis Same as Pre-procedure Electronic Signature(s) Signed: 03/31/2021 6:14:04 PM By: Levan Hurst RN, BSN Entered By: Levan Hurst on 03/31/2021 18:11:35 -------------------------------------------------------------------------------- Encounter Discharge Information Details Patient Name: Date of Service: [name]. 03/31/2021 2:30 PM Medical Record Number: [id number] Patient Account Number: [id number] Date of Birth/Sex: Treating RN: Mar 17, 1935 (86 y.o. [name] Primary Care Mazi Brailsford: Patrecia Pace Other Clinician: Referring Darl Brisbin: Treating Rayelle Armor/Extender: Donita Brooks in Treatment: 4 Encounter Discharge Information Items Discharge Condition: Stable Ambulatory Status: Ambulatory Discharge Destination: Home Transportation: Private Auto Accompanied By: alone Schedule Follow-up Appointment: Yes Clinical Summary of Care: Patient Declined Electronic Signature(s) Signed: 03/31/2021 6:14:04 PM By: Levan Hurst RN, BSN Entered By: Levan Hurst on 03/31/2021  18:12:07 -------------------------------------------------------------------------------- Lower Extremity Assessment Details Patient Name: Date of Service: [name]. 03/31/2021 2:30 PM Medical Record Number: [id number] Patient Account Number: [id number] Date of Birth/Sex: Treating RN: 1935/05/29 (86 y.o. [name] Primary Care Leocadia Idleman: Patrecia Pace Other Clinician: Referring Kasia Trego: Treating Kiersten Coss/Extender: Donita Brooks in Treatment: 4 Edema Assessment Assessed: Shirlyn Goltz: No] [Right: No] Edema: [Left: Ye] [Right: s] Calf Left: Right: Point of Measurement: 28 cm From Medial Instep 41 cm Ankle Left: Right: Point of Measurement: 8 cm From Medial Instep 23 cm Electronic Signature(s) Signed: 03/31/2021 6:18:53 PM By: Dellie Catholic RN Entered By: Dellie Catholic on 03/31/2021 14:35:28 -------------------------------------------------------------------------------- Multi Wound Chart Details Patient Name:  Date of Service: [name], [name] 03/31/2021 2:30 PM Medical Record Number: [id number] Patient Account Number: [id number] Date of Birth/Sex: Treating RN: [birth year] (86 y.o. [name] Primary Care Iyonnah Ferrante: Patrecia Pace Other Clinician: Referring Shadai Mcclane: Treating Toy Samarin/Extender: Donita Brooks in Treatment: 4 Vital Signs Height(in): 60 Pulse(bpm): 5 Weight(lbs): 155 Blood Pressure(mmHg): 193/83 Body Mass Index(BMI): 30.3 Temperature(F): 98.6 Respiratory Rate(breaths/min): 18 Photos: Left, Anterior Lower Leg Left, Anterior Lower Leg Left, Proximal, Anterior Lower Leg Wound Location: Surgical Injury Surgical Injury Gradually Appeared Wounding Event: Infection - not elsewhere classifiedInfection - not elsewhere classified Atypical Primary Etiology: Cataracts, Hypertension, Cataracts, Hypertension, Cataracts, Hypertension, Comorbid History: Osteoarthritis Osteoarthritis  Osteoarthritis 11/04/2020 11/04/2020 03/10/2021 Date Acquired: 4 4 3  Weeks of Treatment: Open Open Open Wound Status: No No No Wound Recurrence: No No Yes Clustered Wound: N/A N/A 2 Clustered Quantity: 1.3x0.8x0.2 1.3x0.8x0.2 1x0.6x0.3 Measurements L x W x D (cm) 0.817 0.817 0.471 A (cm) : rea 0.163 0.163 0.141 Volume (cm) : 32.50% 32.50% -24.90% % Reduction in A rea: 55.10% 55.10% 25.00% % Reduction in Volume: 10 5 Position 1 (o'clock): 2 1.9 Maximum Distance 1 (cm): No Yes Yes Tunneling: Full Thickness With Exposed Support Full Thickness With Exposed Support Full Thickness Without Exposed Classification: Structures Structures Support Structures Medium Medium Medium Exudate Amount: Serosanguineous Serosanguineous Purulent Exudate Type: red, brown red, brown yellow, brown, green Exudate Color: No No Yes Foul Odor A Cleansing: fter N/A N/A No Odor Anticipated Due to Product Use: Well defined, not attached Well defined, not attached Well defined, not attached Wound Margin: Medium (34-66%) Medium (34-66%) Large (67-100%) Granulation A mount: Red, Pink Red, Pink Red Granulation Quality: Medium (34-66%) Medium (34-66%) Small (1-33%) Necrotic Amount: Fat Layer (Subcutaneous Tissue): Yes Fat Layer (Subcutaneous Tissue): Yes Fat Layer (Subcutaneous Tissue): Yes Exposed Structures: Fascia: No Fascia: No Fascia: No Tendon: No Tendon: No Tendon: No Muscle: No Muscle: No Muscle: No Joint: No Joint: No Joint: No Bone: No Bone: No Bone: No None None None Epithelialization: Treatment Notes Electronic Signature(s) Signed: 03/31/2021 5:02:53 PM By: Linton Ham MD Signed: 03/31/2021 6:14:04 PM By: Levan Hurst RN, BSN Entered By: Linton Ham on 03/31/2021 15:13:13 -------------------------------------------------------------------------------- Multi-Disciplinary Care Plan Details Patient Name: Date of Service: [name]. 03/31/2021 2:30  PM Medical Record Number: [id number] Patient Account Number: [id number] Date of Birth/Sex: Treating RN: 06-Nov-1935 (86 y.o. [name] Primary Care Yekaterina Escutia: Patrecia Pace Other Clinician: Referring Francoise Chojnowski: Treating Zosia Lucchese/Extender: Donita Brooks in Treatment: 4 Active Inactive Venous Leg Ulcer Nursing Diagnoses: Potential for venous Insuffiency (use before diagnosis confirmed) Goals: Patient will maintain optimal edema control Date Initiated: 03/03/2021 Target Resolution Date: 05/01/2021 Goal Status: Active Interventions: Compression as ordered Provide education on venous insufficiency Treatment Activities: Therapeutic compression applied : 03/03/2021 Notes: Wound/Skin Impairment Nursing Diagnoses: Impaired tissue integrity Goals: Patient/caregiver will verbalize understanding of skin care regimen Date Initiated: 03/03/2021 Target Resolution Date: 05/01/2021 Goal Status: Active Ulcer/skin breakdown will have a volume reduction of 30% by week 4 Date Initiated: 03/03/2021 Date Inactivated: 03/31/2021 Target Resolution Date: 03/31/2021 Goal Status: Unmet Unmet Reason: calcifications Interventions: Assess patient/caregiver ability to obtain necessary supplies Assess patient/caregiver ability to perform ulcer/skin care regimen upon admission and as needed Assess ulceration(s) every visit Provide education on ulcer and skin care Treatment Activities: Topical wound management initiated : 03/03/2021 Notes: Electronic Signature(s) Signed: 03/31/2021 6:14:04 PM By: Levan Hurst RN, BSN Entered By: Levan Hurst on 03/31/2021 18:10:45 -------------------------------------------------------------------------------- Pain Assessment Details Patient Name: Date of Service: [name].  03/31/2021 2:30 PM Medical Record Number: [id number] Patient Account Number: [id number] Date of Birth/Sex: Treating RN: 1935/07/04 (86 y.o. [name] Primary Care Charman Blasco: Patrecia Pace Other Clinician: Referring Makailee Nudelman: Treating Kataleya Zaugg/Extender: Donita Brooks in Treatment: 4 Active Problems Location of Pain Severity and Description of Pain Patient Has Paino No Site Locations Pain Management and Medication Current Pain Management: Electronic Signature(s) Signed: 03/31/2021 6:18:53 PM By: Dellie Catholic RN Entered By: Dellie Catholic on 03/31/2021 14:29:36 -------------------------------------------------------------------------------- Patient/Caregiver Education Details Patient Name: Date of Service: [name] 2/7/2023andnbsp2:30 PM Medical Record Number: [id number] Patient Account Number: [id number] Date of Birth/Gender: Treating RN: 02/23/1935 (86 y.o. [name] Primary Care Physician: Patrecia Pace Other Clinician: Referring Physician: Treating Physician/Extender: Donita Brooks in Treatment: 4 Education Assessment Education Provided To: Patient Education Topics Provided Wound/Skin Impairment: Methods: Explain/Verbal Responses: State content correctly Electronic Signature(s) Signed: 03/31/2021 6:14:04 PM By: Levan Hurst RN, BSN Entered By: Levan Hurst on 03/31/2021 18:10:57 -------------------------------------------------------------------------------- Wound Assessment Details Patient Name: Date of Service: [name]. 03/31/2021 2:30 PM Medical Record Number: [id number] Patient Account Number: [id number] Date of Birth/Sex: Treating RN: 08/17/1935 (86 y.o. [name] Primary Care Dalicia Kisner: Patrecia Pace Other Clinician: Referring Dylan Monforte: Treating Fedra Lanter/Extender: Donita Brooks in Treatment: 4 Wound Status Wound Number: 1 Primary Etiology: Infection - not elsewhere classified Wound Location: Left, Anterior Lower Leg Wound Status: Open Wounding Event: Surgical Injury Comorbid  History: Cataracts, Hypertension, Osteoarthritis Date Acquired: 11/04/2020 Weeks Of Treatment: 4 Clustered Wound: No Photos Wound Measurements Length: (cm) 1.3 Width: (cm) 0.8 Depth: (cm) 0.2 Area: (cm) 0.817 Volume: (cm) 0.163 % Reduction in Area: 32.5% % Reduction in Volume: 55.1% Epithelialization: None Tunneling: No Undermining: No Wound Description Classification: Full Thickness With Exposed Support Structures Wound Margin: Well defined, not attached Exudate Amount: Medium Exudate Type: Serosanguineous Exudate Color: red, brown Foul Odor After Cleansing: No Slough/Fibrino Yes Wound Bed Granulation Amount: Medium (34-66%) Exposed Structure Granulation Quality: Red, Pink Fascia Exposed: No Necrotic Amount: Medium (34-66%) Fat Layer (Subcutaneous Tissue) Exposed: Yes Necrotic Quality: Adherent Slough Tendon Exposed: No Muscle Exposed: No Joint Exposed: No Bone Exposed: No Electronic Signature(s) Signed: 03/31/2021 6:18:53 PM By: Dellie Catholic RN Entered By: Dellie Catholic on 03/31/2021 14:50:18 -------------------------------------------------------------------------------- Wound Assessment Details Patient Name: Date of Service: [name]. 03/31/2021 2:30 PM Medical Record Number: [id number] Patient Account Number: [id number] Date of Birth/Sex: Treating RN: [birth year] (86 y.o. [name] Primary Care Snyder Colavito: Patrecia Pace Other Clinician: Referring Drystan Reader: Treating Joshue Badal/Extender: Donita Brooks in Treatment: 4 Wound Status Wound Number: 1 Primary Etiology: Infection - not elsewhere classified Wound Location: Left, Anterior Lower Leg Wound Status: Open Wounding Event: Surgical Injury Comorbid History: Cataracts, Hypertension, Osteoarthritis Date Acquired: 11/04/2020 Weeks Of Treatment: 4 Clustered Wound: No Photos Wound Measurements Length: (cm) 1.3 Width: (cm) 0.8 Depth: (cm) 0.2 Area: (cm)  0.817 Volume: (cm) 0.163 % Reduction in Area: 32.5% % Reduction in Volume: 55.1% Epithelialization: None Tunneling: Yes Position (o'clock): 10 Maximum Distance: (cm) 2 Undermining: No Wound Description Classification: Full Thickness With Exposed Support Structures Wound Margin: Well defined, not attached Exudate Amount: Medium Exudate Type: Serosanguineous Exudate Color: red, brown Foul Odor After Cleansing: No Slough/Fibrino Yes Wound Bed Granulation Amount: Medium (34-66%) Exposed Structure Granulation Quality: Red, Pink Fascia Exposed: No Necrotic Amount: Medium (34-66%) Fat Layer (Subcutaneous Tissue) Exposed: Yes Necrotic Quality: Adherent Slough Tendon Exposed: No Muscle Exposed: No Joint Exposed: No Bone Exposed: No Treatment Notes Wound #1 (Lower  Leg) Wound Laterality: Left, Anterior Cleanser Soap and Water Discharge Instruction: May shower and wash wound with dial antibacterial soap and water prior to dressing change. Wound Cleanser Discharge Instruction: Cleanse the wound with wound cleanser prior to applying a clean dressing using gauze sponges, not tissue or cotton balls. Peri-Wound Care Triamcinolone 15 (g) Discharge Instruction: Use triamcinolone 15 (g) as directed Sween Lotion (Moisturizing lotion) Discharge Instruction: Apply moisturizing lotion as directed Topical Gentamicin Discharge Instruction: As directed by physician Primary Dressing KerraCel Ag Gelling Fiber Dressing, 4x5 in (silver alginate) Discharge Instruction: Apply silver alginate to wound bed as instructed Secondary Dressing Woven Gauze Sponge, Non-Sterile 4x4 in Discharge Instruction: Apply over primary dressing as directed. ABD Pad, 5x9 Discharge Instruction: Apply over primary dressing as directed. Secured With Compression Wrap ThreePress (3 layer compression wrap) Discharge Instruction: Apply three layer compression as directed. Compression Stockings Add-Ons Electronic  Signature(s) Signed: 03/31/2021 6:18:53 PM By: Dellie Catholic RN Entered By: Dellie Catholic on 03/31/2021 63:78:58 -------------------------------------------------------------------------------- Wound Assessment Details Patient Name: Date of Service: [name]. 03/31/2021 2:30 PM Medical Record Number: [id number] Patient Account Number: [id number] Date of Birth/Sex: Treating RN: 1935/07/30 (86 y.o. [name] Primary Care Olevia Westervelt: Patrecia Pace Other Clinician: Referring Caydee Talkington: Treating Kylyn Sookram/Extender: Donita Brooks in Treatment: 4 Wound Status Wound Number: 2 Primary Etiology: Atypical Wound Location: Left, Proximal, Anterior Lower Leg Wound Status: Open Wounding Event: Gradually Appeared Comorbid History: Cataracts, Hypertension, Osteoarthritis Date Acquired: 03/10/2021 Weeks Of Treatment: 3 Clustered Wound: Yes Photos Wound Measurements Length: (cm) 1 Width: (cm) 0.6 Depth: (cm) 0.3 Clustered Quantity: 2 Area: (cm) 0.471 Volume: (cm) 0.141 % Reduction in Area: -24.9% % Reduction in Volume: 25% Epithelialization: None Tunneling: Yes Position (o'clock): 5 Maximum Distance: (cm) 1.9 Undermining: No Wound Description Classification: Full Thickness Without Exposed Support Structures Wound Margin: Well defined, not attached Exudate Amount: Medium Exudate Type: Purulent Exudate Color: yellow, brown, green Foul Odor After Cleansing: Yes Due to Product Use: No Slough/Fibrino Yes Wound Bed Granulation Amount: Large (67-100%) Exposed Structure Granulation Quality: Red Fascia Exposed: No Necrotic Amount: Small (1-33%) Fat Layer (Subcutaneous Tissue) Exposed: Yes Necrotic Quality: Adherent Slough Tendon Exposed: No Muscle Exposed: No Joint Exposed: No Bone Exposed: No Treatment Notes Wound #2 (Lower Leg) Wound Laterality: Left, Anterior, Proximal Cleanser Soap and Water Discharge Instruction: May shower and wash  wound with dial antibacterial soap and water prior to dressing change. Wound Cleanser Discharge Instruction: Cleanse the wound with wound cleanser prior to applying a clean dressing using gauze sponges, not tissue or cotton balls. Peri-Wound Care Triamcinolone 15 (g) Discharge Instruction: Use triamcinolone 15 (g) as directed Sween Lotion (Moisturizing lotion) Discharge Instruction: Apply moisturizing lotion as directed Topical Gentamicin Discharge Instruction: As directed by physician Primary Dressing KerraCel Ag Gelling Fiber Dressing, 4x5 in (silver alginate) Discharge Instruction: Apply silver alginate to wound bed as instructed Secondary Dressing Woven Gauze Sponge, Non-Sterile 4x4 in Discharge Instruction: Apply over primary dressing as directed. ABD Pad, 5x9 Discharge Instruction: Apply over primary dressing as directed. Secured With Compression Wrap ThreePress (3 layer compression wrap) Discharge Instruction: Apply three layer compression as directed. Compression Stockings Add-Ons Electronic Signature(s) Signed: 03/31/2021 6:18:53 PM By: Dellie Catholic RN Entered By: Dellie Catholic on 03/31/2021 15:09:19 -------------------------------------------------------------------------------- Vitals Details Patient Name: Date of Service: [name]. 03/31/2021 2:30 PM Medical Record Number: [id number] Patient Account Number: [id number] Date of Birth/Sex: Treating RN: 1935-11-13 (86 y.o. [name] Primary Care Vivion Romano: Patrecia Pace Other Clinician: Referring Erol Flanagin: Treating Timothea Bodenheimer/Extender: Linton Ham  Patrecia Pace Weeks in Treatment: 4 Vital Signs Time Taken: 14:28 Temperature (F): 98.6 Height (in): 60 Pulse (bpm): 79 Source: Stated Respiratory Rate (breaths/min): 18 Weight (lbs): 155 Blood Pressure (mmHg): 193/83 Source: Stated Reference Range: 80 - 120 mg / dl Body Mass Index (BMI): 30.3 Electronic Signature(s) Signed: 03/31/2021  6:18:53 PM By: Dellie Catholic RN Entered By: Dellie Catholic on 03/31/2021 14:29:22

## 2021-04-01 NOTE — Progress Notes (Signed)
VIKI, CARRERA ([id number]) Visit Report for 03/31/2021 HPI Details Patient Name: Date of Service: MYSTI, HALEY 03/31/2021 2:30 PM Medical Record Number: [id number] Patient Account Number: [id number] Date of Birth/Sex: Treating RN: [birth year] (86 y.o. [name] Primary Care Provider: Patrecia Pace Other Clinician: Referring Provider: Treating Provider/Extender: Donita Brooks in Treatment: 4 History of Present Illness HPI Description: ADMISSION 03/03/2021 This is a 86 year old woman who tells me she fell 13 years ago. She had lumps on her legs since then. There was never an open wound at the time. These "lumps" were hard but nonpainful. She was seen by dermatology at [facility] dermatology who did she Dr. Martin Majestic in September who performed a punch biopsy on 1 of these. We do not have these results. The area was sutured and then sometime later the sutures were removed and the wound dehisced. She developed redness and swelling in the area and received a course of Vantin as well as prior courses of Augmentin and doxycycline. A culture done at the end of December showed Corynebacterium which is a common skin contaminant and unlikely to have been a true pathogen. She is here for review of this. She has been using Silvadene cream Past medical history includes migraines hypertension arthritis she is a prediabetic with a last hemoglobin of 6 apparently to have bladder surgery early in February ABI in the left leg was not obtained Because of1/17/2023; patient admitted to the clinic last week. 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 This underwent a biopsy by [facility] dermatology. The biopsy showed traumatic panniculitis and identified areas of fat necrosis calcification and old areas of hemorrhage. She arrived in the clinic with a probing area with underlying calcification. Swallowing I put this in compression. PCR  culture showed Enterobacter and Actinotignum. The latter of which I never really heard of researching shows a cause of UTIs in older adults. Treatment is 2 weeks of a beta-lactam antibiotic. I started the patient on cefdinir for the Enterobacter. She is allergic to quinolones and sulfa She continues to have erythema around the wound I marked the margins of this last time no different this time. She says dermatology injected this area with steroids in order to control this I do not know that its done anything 1/24; culture I did last time showed it once again Enterobacter cloacae I. The cefdinir that I gave her should have covered this we are also using topical gentamicin. Unfortunately she arrives with the wound looking somewhat worse. She has original wound the satellite wound and several small satellite wounds around this. Each 1 of these has underlying calcification. I spoken to the patient the length of her initial hard area was about the same length is what is opening now.. The erythema around the wound however looks somewhat better 1/31; she has completed antibiotics. 2 open wounds both with exposed calcification. She has an appointment with Dr. Claudia Desanctis of plastic surgery on Friday. She is complaining of less pain 2/7; the patient was kindly reviewed by Dr. Claudia Desanctis of plastic surgery. He has agreed to perform an operative debridement probably with intraoperative placement of a graft either ACell or Integra. So far we do not have a timeframe for this she is waiting to hear Electronic Signature(s) Signed: 03/31/2021 5:02:53 PM By: Linton Ham MD Entered By: Linton Ham on 03/31/2021 15:14:15 -------------------------------------------------------------------------------- Physical Exam Details Patient Name: Date of Service: [name] 03/31/2021 2:30 PM Medical Record Number:  [id number] Patient Account Number: [id number] Date of Birth/Sex: Treating RN: Jul 03, 1935 (86 y.o. [name] Primary Care Provider: Patrecia Pace Other Clinician: Referring Provider: Treating Provider/Extender: Donita Brooks in Treatment: 4 Constitutional Patient is hypertensive.. Pulse regular and within target range for patient.Marland Kitchen Respirations regular, non-labored and within target range.. Temperature is normal and within the target range for the patient.Marland Kitchen Appears in no distress. Notes Wound exam; left medial calf 2 punched-out areas which connect with a skinny. The entirety of the small wounds and the connecting tissue has underlying calcification. No clear evidence of infection she has been using gentamicin cream and topical calcium alginate. She only has 2 layer compression she may have some degree of lymphedema chronic venous insufficiency Electronic Signature(s) Signed: 03/31/2021 5:02:53 PM By: Linton Ham MD Entered By: Linton Ham on 03/31/2021 15:15:36 -------------------------------------------------------------------------------- Physician Orders Details Patient Name: Date of Service: [name] 03/31/2021 2:30 PM Medical Record Number: [id number] Patient Account Number: [id number] Date of Birth/Sex: Treating RN: Jul 09, 1935 (86 y.o. [name] Primary Care Provider: Patrecia Pace Other Clinician: Referring Provider: Treating Provider/Extender: Donita Brooks in Treatment: 4 Verbal / Phone Orders: No Diagnosis Coding Follow-up Appointments ppointment in 1 week. - with Dr. Dellia Nims Return A Bathing/ Shower/ Hygiene May shower with protection but do not get wound dressing(s) wet. - May use cast protector bag. Can get at [facility] or [facility] Edema Control - Lymphedema / SCD / Other Elevate legs to the level of the heart or above for 30 minutes daily and/or when sitting, a frequency of: Avoid standing for long periods of time. Additional Orders / Instructions Follow Nutritious Diet Wound Treatment Wound  #1 - Lower Leg Wound Laterality: Left, Anterior Cleanser: Soap and Water 1 x Per Week/30 Days Discharge Instructions: May shower and wash wound with dial antibacterial soap and water prior to dressing change. Cleanser: Wound Cleanser 1 x Per Week/30 Days Discharge Instructions: Cleanse the wound with wound cleanser prior to applying a clean dressing using gauze sponges, not tissue or cotton balls. Peri-Wound Care: Triamcinolone 15 (g) 1 x Per Week/30 Days Discharge Instructions: Use triamcinolone 15 (g) as directed Peri-Wound Care: Sween Lotion (Moisturizing lotion) 1 x Per Week/30 Days Discharge Instructions: Apply moisturizing lotion as directed Topical: Gentamicin 1 x Per Week/30 Days Discharge Instructions: As directed by physician Prim Dressing: KerraCel Ag Gelling Fiber Dressing, 4x5 in (silver alginate) 1 x Per Week/30 Days ary Discharge Instructions: Apply silver alginate to wound bed as instructed Secondary Dressing: Woven Gauze Sponge, Non-Sterile 4x4 in 1 x Per Week/30 Days Discharge Instructions: Apply over primary dressing as directed. Secondary Dressing: ABD Pad, 5x9 1 x Per Week/30 Days Discharge Instructions: Apply over primary dressing as directed. Compression Wrap: ThreePress (3 layer compression wrap) 1 x Per Week/30 Days Discharge Instructions: Apply three layer compression as directed. Wound #2 - Lower Leg Wound Laterality: Left, Anterior, Proximal Cleanser: Soap and Water 1 x Per Week/30 Days Discharge Instructions: May shower and wash wound with dial antibacterial soap and water prior to dressing change. Cleanser: Wound Cleanser 1 x Per Week/30 Days Discharge Instructions: Cleanse the wound with wound cleanser prior to applying a clean dressing using gauze sponges, not tissue or cotton balls. Peri-Wound Care: Triamcinolone 15 (g) 1 x Per Week/30 Days Discharge Instructions: Use triamcinolone 15 (g) as directed Peri-Wound Care: Sween Lotion (Moisturizing lotion) 1 x  Per Week/30 Days Discharge Instructions: Apply moisturizing lotion as directed Topical: Gentamicin 1 x Per Week/30 Days Discharge Instructions:  As directed by physician Prim Dressing: KerraCel Ag Gelling Fiber Dressing, 4x5 in (silver alginate) 1 x Per Week/30 Days ary Discharge Instructions: Apply silver alginate to wound bed as instructed Secondary Dressing: Woven Gauze Sponge, Non-Sterile 4x4 in 1 x Per Week/30 Days Discharge Instructions: Apply over primary dressing as directed. Secondary Dressing: ABD Pad, 5x9 1 x Per Week/30 Days Discharge Instructions: Apply over primary dressing as directed. Compression Wrap: ThreePress (3 layer compression wrap) 1 x Per Week/30 Days Discharge Instructions: Apply three layer compression as directed. Electronic Signature(s) Signed: 03/31/2021 5:02:53 PM By: Linton Ham MD Signed: 03/31/2021 6:18:53 PM By: Dellie Catholic RN Entered By: Dellie Catholic on 03/31/2021 15:11:33 -------------------------------------------------------------------------------- Problem List Details Patient Name: Date of Service: [name] 03/31/2021 2:30 PM Medical Record Number: [id number] Patient Account Number: [id number] Date of Birth/Sex: Treating RN: 27-Aug-1935 (86 y.o. 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 03/31/2021 2:30 PM Medical Record Number: [id number] Patient Account Number: [id number] Date of Birth/Sex: Treating RN: 04-04-1935 (86 y.o. [name] Primary Care Provider: Patrecia Pace Other Clinician: Referring Provider: Treating Provider/Extender: Donita Brooks in Treatment: 4 Subjective History of Present Illness (HPI) ADMISSION 03/03/2021 This is a 86 year old woman who tells me she fell 13 years ago. She had lumps on her legs since then. There was never an open wound at the time. These "lumps" were hard but nonpainful. She was seen by dermatology at [facility] dermatology who did she Dr. Martin Majestic in September who performed a punch biopsy on 1 of these. We do not have these results. The area was sutured and then sometime later the sutures were removed and the wound dehisced. She developed redness and swelling in the area and received a course of Vantin as well as prior courses of Augmentin and doxycycline. A culture done at the end of December showed Corynebacterium which is a common skin contaminant and unlikely to have been a true pathogen. She is here for review of this. She has been using Silvadene cream Past medical history includes migraines hypertension arthritis she is a prediabetic with a last hemoglobin of 6 apparently to have bladder surgery early in February ABI in the left leg was not obtained Because of1/17/2023; patient admitted to the clinic last week. 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 This underwent a biopsy by [facility] dermatology. The biopsy showed traumatic panniculitis and identified areas of fat necrosis calcification and old areas of hemorrhage. She arrived in the clinic with a probing area with underlying calcification.  Swallowing I put this in compression. PCR culture showed Enterobacter and Actinotignum. The latter of which I never really heard of researching shows a cause of UTIs in older adults. Treatment is 2 weeks of a beta-lactam antibiotic. I started the patient on cefdinir for the Enterobacter. She is allergic to quinolones and sulfa She continues to have erythema around the wound I marked the margins of  this last time no different this time. She says dermatology injected this area with steroids in order to control this I do not know that its done anything 1/24; culture I did last time showed it once again Enterobacter cloacae I. The cefdinir that I gave her should have covered this we are also using topical gentamicin. Unfortunately she arrives with the wound looking somewhat worse. She has original wound the satellite wound and several small satellite wounds around this. Each 1 of these has underlying calcification. I spoken to the patient the length of her initial hard area was about the same length is what is opening now.. The erythema around the wound however looks somewhat better 1/31; she has completed antibiotics. 2 open wounds both with exposed calcification. She has an appointment with Dr. Claudia Desanctis of plastic surgery on Friday. She is complaining of less pain 2/7; the patient was kindly reviewed by Dr. Claudia Desanctis of plastic surgery. He has agreed to perform an operative debridement probably with intraoperative placement of a graft either ACell or Integra. So far we do not have a timeframe for this she is waiting to hear Objective Constitutional Patient is hypertensive.. Pulse regular and within target range for patient.Marland Kitchen Respirations regular, non-labored and within target range.. Temperature is normal and within the target range for the patient.Marland Kitchen Appears in no distress. Vitals Time Taken: 2:28 PM, Height: 60 in, Source: Stated, Weight: 155 lbs, Source: Stated, BMI: 30.3, Temperature: 98.6 F, Pulse: 79  bpm, Respiratory Rate: 18 breaths/min, Blood Pressure: 193/83 mmHg. General Notes: Wound exam; left medial calf 2 punched-out areas which connect with a skinny. The entirety of the small wounds and the connecting tissue has underlying calcification. No clear evidence of infection she has been using gentamicin cream and topical calcium alginate. She only has 2 layer compression she may have some degree of lymphedema chronic venous insufficiency Integumentary (Hair, Skin) Wound #1 status is Open. Original cause of wound was Surgical Injury. The date acquired was: 11/04/2020. The wound has been in treatment 4 weeks. The wound is located on the Left,Anterior Lower Leg. The wound measures 1.3cm length x 0.8cm width x 0.2cm depth; 0.817cm^2 area and 0.163cm^3 volume. There is Fat Layer (Subcutaneous Tissue) exposed. There is no tunneling or undermining noted. There is a medium amount of serosanguineous drainage noted. The wound margin is well defined and not attached to the wound base. There is medium (34-66%) red, pink granulation within the wound bed. There is a medium (34-66%) amount of necrotic tissue within the wound bed including Adherent Slough. Wound #1 status is Open. Original cause of wound was Surgical Injury. The date acquired was: 11/04/2020. The wound has been in treatment 4 weeks. The wound is located on the Left,Anterior Lower Leg. The wound measures 1.3cm length x 0.8cm width x 0.2cm depth; 0.817cm^2 area and 0.163cm^3 volume. There is Fat Layer (Subcutaneous Tissue) exposed. There is no undermining noted, however, there is tunneling at 10:00 with a maximum distance of 2cm. There is a medium amount of serosanguineous drainage noted. The wound margin is well defined and not attached to the wound base. There is medium (34- 66%) red, pink granulation within the wound bed. There is a medium (34-66%) amount of necrotic tissue within the wound bed including Adherent Slough. Wound #2 status is  Open. Original cause of wound was Gradually Appeared. The date acquired was: 03/10/2021. The wound has been in treatment 3 weeks. The wound is located on the Left,Proximal,Anterior Lower Leg. The wound measures 1cm length  x 0.6cm width x 0.3cm depth; 0.471cm^2 area and 0.141cm^3 volume. There is Fat Layer (Subcutaneous Tissue) exposed. There is no undermining noted, however, there is tunneling at 5:00 with a maximum distance of 1.9cm. There is a medium amount of purulent drainage noted. Foul odor after cleansing was noted. The wound margin is well defined and not attached to the wound base. There is large (67-100%) red granulation within the wound bed. There is a small (1-33%) amount of necrotic tissue within the wound bed including Adherent Slough. 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 - with Dr. Arcola Jansky Shower/ Hygiene: May shower with protection but do not get wound dressing(s) wet. - May use cast protector bag. Can get at [facility] or [facility] Edema Control - Lymphedema / SCD / Other: Elevate legs to the level of the heart or above for 30 minutes daily and/or when sitting, a frequency of: Avoid standing for long periods of time. Additional Orders / Instructions: Follow Nutritious Diet WOUND #1: - Lower Leg Wound Laterality: Left, Anterior Cleanser: Soap and Water 1 x Per Week/30 Days Discharge Instructions: May shower and wash wound with dial antibacterial soap and water prior to dressing change. Cleanser: Wound Cleanser 1 x Per Week/30 Days Discharge Instructions: Cleanse the wound with wound cleanser prior to applying a clean dressing using gauze sponges, not tissue or cotton balls. Peri-Wound Care: Triamcinolone 15 (g) 1 x Per Week/30  Days Discharge Instructions: Use triamcinolone 15 (g) as directed Peri-Wound Care: Sween Lotion (Moisturizing lotion) 1 x Per Week/30 Days Discharge Instructions: Apply moisturizing lotion as directed Topical: Gentamicin 1 x Per Week/30 Days Discharge Instructions: As directed by physician Prim Dressing: KerraCel Ag Gelling Fiber Dressing, 4x5 in (silver alginate) 1 x Per Week/30 Days ary Discharge Instructions: Apply silver alginate to wound bed as instructed Secondary Dressing: Woven Gauze Sponge, Non-Sterile 4x4 in 1 x Per Week/30 Days Discharge Instructions: Apply over primary dressing as directed. Secondary Dressing: ABD Pad, 5x9 1 x Per Week/30 Days Discharge Instructions: Apply over primary dressing as directed. Com pression Wrap: ThreePress (3 layer compression wrap) 1 x Per Week/30 Days Discharge Instructions: Apply three layer compression as directed. WOUND #2: - Lower Leg Wound Laterality: Left, Anterior, Proximal Cleanser: Soap and Water 1 x Per Week/30 Days Discharge Instructions: May shower and wash wound with dial antibacterial soap and water prior to dressing change. Cleanser: Wound Cleanser 1 x Per Week/30 Days Discharge Instructions: Cleanse the wound with wound cleanser prior to applying a clean dressing using gauze sponges, not tissue or cotton balls. Peri-Wound Care: Triamcinolone 15 (g) 1 x Per Week/30 Days Discharge Instructions: Use triamcinolone 15 (g) as directed Peri-Wound Care: Sween Lotion (Moisturizing lotion) 1 x Per Week/30 Days Discharge Instructions: Apply moisturizing lotion as directed Topical: Gentamicin 1 x Per Week/30 Days Discharge Instructions: As directed by physician Prim Dressing: KerraCel Ag Gelling Fiber Dressing, 4x5 in (silver alginate) 1 x Per Week/30 Days ary Discharge Instructions: Apply silver alginate to wound bed as instructed Secondary Dressing: Woven Gauze Sponge, Non-Sterile 4x4 in 1 x Per Week/30 Days Discharge Instructions:  Apply over primary dressing as directed. Secondary Dressing: ABD Pad, 5x9 1 x Per Week/30 Days Discharge Instructions: Apply over primary dressing as directed. Com pression Wrap: ThreePress (3 layer compression wrap) 1 x Per Week/30 Days Discharge Instructions: Apply three layer compression as  directed. 1. I did not change the primary dressing which is calcium alginate 2. I have increased her from 2-3 layer compression 3. I appreciate Dr. Keane Scrape thoughtful review. The patient will require an operative debridement after which the wound will be larger but hopefully more amenable to healing. No doubt that a intraoperative skin graft Electronic Signature(s) Signed: 03/31/2021 5:02:53 PM By: Linton Ham MD Entered By: Linton Ham on 03/31/2021 15:16:37 -------------------------------------------------------------------------------- SuperBill Details Patient Name: Date of Service: [name] 03/31/2021 Medical Record Number: [id number] Patient Account Number: [id number] Date of Birth/Sex: Treating RN: September 06, 1935 (86 y.o. 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 Facility Procedures CPT4 Code: [id number] Description: (Facility Use Only) [phone] - [facility] LWR LT LEG Modifier: Quantity: 1 Physician Procedures : CPT4 Code Description Modifier [id number] - WC PHYS LEVEL 3 - EST PT ICD-10 Diagnosis Description T81.31XD Disruption of external operation (surgical) wound, not elsewhere classified, subsequent encounter L97.228 Non-pressure chronic ulcer of left  calf with other specified  severity 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

## 2021-04-07 ENCOUNTER — Other Ambulatory Visit: Payer: Self-pay

## 2021-04-07 ENCOUNTER — Encounter (HOSPITAL_BASED_OUTPATIENT_CLINIC_OR_DEPARTMENT_OTHER): Payer: Medicare HMO | Admitting: Internal Medicine

## 2021-04-07 DIAGNOSIS — T8131XA Disruption of external operation (surgical) wound, not elsewhere classified, initial encounter: Secondary | ICD-10-CM | POA: Diagnosis not present

## 2021-04-07 NOTE — Progress Notes (Signed)
LUCELY, LEARD ([id number]) Visit Report for 04/07/2021 HPI Details Patient Name: Date of Service: [name], [name] 04/07/2021 2:30 PM Medical Record Number: [id number] Patient Account Number: [id number] Date of Birth/Sex: Treating RN: 02-01-1936 (86 y.o. [name] Primary Care Provider: Patrecia Pace Other Clinician: Referring Provider: Treating Provider/Extender: Donita Brooks in Treatment: 5 History of Present Illness HPI Description: ADMISSION 03/03/2021 This is a 86 year old woman who tells me she fell 13 years ago. She had lumps on her legs since then. There was never an open wound at the time. These "lumps" were hard but nonpainful. She was seen by dermatology at [facility] dermatology who did she Dr. Martin Majestic in September who performed a punch biopsy on 1 of these. We do not have these results. The area was sutured and then sometime later the sutures were removed and the wound dehisced. She developed redness and swelling in the area and received a course of Vantin as well as prior courses of Augmentin and doxycycline. A culture done at the end of December showed Corynebacterium which is a common skin contaminant and unlikely to have been a true pathogen. She is here for review of this. She has been using Silvadene cream Past medical history includes migraines hypertension arthritis she is a prediabetic with a last hemoglobin of 6 apparently to have bladder surgery early in February ABI in the left leg was not obtained Because of1/17/2023; patient admitted to the clinic last week. 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 This underwent a biopsy by [facility] dermatology. The biopsy showed traumatic panniculitis and identified areas of fat necrosis calcification and old areas of hemorrhage. She arrived in the clinic with a probing area with underlying calcification. Swallowing I put this in compression. PCR  culture showed Enterobacter and Actinotignum. The latter of which I never really heard of researching shows a cause of UTIs in older adults. Treatment is 2 weeks of a beta-lactam antibiotic. I started the patient on cefdinir for the Enterobacter. She is allergic to quinolones and sulfa She continues to have erythema around the wound I marked the margins of this last time no different this time. She says dermatology injected this area with steroids in order to control this I do not know that its done anything 1/24; culture I did last time showed it once again Enterobacter cloacae I. The cefdinir that I gave her should have covered this we are also using topical gentamicin. Unfortunately she arrives with the wound looking somewhat worse. She has original wound the satellite wound and several small satellite wounds around this. Each 1 of these has underlying calcification. I spoken to the patient the length of her initial hard area was about the same length is what is opening now.. The erythema around the wound however looks somewhat better 1/31; she has completed antibiotics. 2 open wounds both with exposed calcification. She has an appointment with Dr. Claudia Desanctis of plastic surgery on Friday. She is complaining of less pain 2/7; the patient was kindly reviewed by Dr. Claudia Desanctis of plastic surgery. He has agreed to perform an operative debridement probably with intraoperative placement of a graft either ACell or Integra. So far we do not have a timeframe for this she is waiting to hear 2/14; Dr. Claudia Desanctis is apparently waiting for insurance approval. Herbie [place] no real change in the wounded area. 3 small connected areas on the left anterior lower leg. We have been using silver alginate. No real  improvement however Electronic Signature(s) Signed: 04/07/2021 4:15:21 PM By: Linton Ham MD Entered By: Linton Ham on 04/07/2021  15:33:57 -------------------------------------------------------------------------------- Physical Exam Details Patient Name: Date of Service: [name] 04/07/2021 2:30 PM Medical Record Number: [id number] Patient Account Number: [id number] Date of Birth/Sex: Treating RN: September 26, 1935 (86 y.o. [name] Primary Care Provider: Patrecia Pace Other Clinician: Referring Provider: Treating Provider/Extender: Donita Brooks in Treatment: 5 Constitutional Patient is hypertensive.. Pulse regular and within target range for patient.Marland Kitchen Respirations regular, non-labored and within target range.. Temperature is normal and within the target range for the patient.Marland Kitchen Appears in no distress. Notes Wound exam; left medial calf punched-out area which connects. There is underlying calcification here the entire area looks somewhat swollen and red but no active infection no tenderness Electronic Signature(s) Signed: 04/07/2021 4:15:21 PM By: Linton Ham MD Entered By: Linton Ham on 04/07/2021 15:32:40 -------------------------------------------------------------------------------- Physician Orders Details Patient Name: Date of Service: [name]. 04/07/2021 2:30 PM Medical Record Number: [id number] Patient Account Number: [id number] Date of Birth/Sex: Treating RN: 1935/06/30 (86 y.o. 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 - with Dr. Dellia Nims - room 3 Return A Bathing/ Shower/ Hygiene May  shower with protection but do not get wound dressing(s) wet. - May use cast protector bag. Can get at [facility] or [facility] Edema Control - Lymphedema / SCD / Other Elevate legs to the level of the heart or above for 30 minutes daily and/or when sitting, a frequency of: Avoid standing for long periods of time. Exercise regularly Additional Orders / Instructions Follow Nutritious Diet Wound Treatment Wound #1 - Lower Leg Wound Laterality: Left, Anterior Cleanser: Soap and Water 1 x Per Week/30 Days Discharge Instructions: May shower and wash wound with dial antibacterial soap and water prior to dressing change. Cleanser: Wound Cleanser 1 x Per Week/30 Days Discharge Instructions: Cleanse the wound with wound cleanser prior to applying a clean dressing using gauze sponges, not tissue or cotton balls. Peri-Wound Care: Triamcinolone 15 (g) 1 x Per Week/30 Days Discharge Instructions: Use triamcinolone 15 (g) as directed Peri-Wound Care: Sween Lotion (Moisturizing lotion) 1 x Per Week/30 Days Discharge Instructions: Apply moisturizing lotion as directed Topical: Gentamicin 1 x Per Week/30 Days Discharge Instructions: As directed by physician Prim Dressing: KerraCel Ag Gelling Fiber Dressing, 4x5 in (silver alginate) 1 x Per Week/30 Days ary Discharge Instructions: Apply silver alginate to wound bed as instructed Secondary Dressing: Woven Gauze Sponge, Non-Sterile 4x4 in 1 x Per Week/30 Days Discharge Instructions: Apply over primary dressing as directed. Secondary Dressing: ABD Pad, 5x9 1 x Per Week/30 Days Discharge Instructions: Apply over primary dressing as directed. Compression Wrap: ThreePress (3 layer compression wrap) 1 x Per Week/30 Days Discharge Instructions: Apply three layer compression as directed. Wound #2 - Lower Leg Wound Laterality: Left, Anterior, Proximal Cleanser: Soap and Water 1 x Per Week/30 Days Discharge Instructions: May shower and wash wound with dial antibacterial  soap and water prior to dressing change. Cleanser: Wound Cleanser 1 x Per Week/30 Days Discharge Instructions: Cleanse the wound with wound cleanser prior to applying a clean dressing using gauze sponges, not tissue or cotton balls. Peri-Wound Care:  Triamcinolone 15 (g) 1 x Per Week/30 Days Discharge Instructions: Use triamcinolone 15 (g) as directed Peri-Wound Care: Sween Lotion (Moisturizing lotion) 1 x Per Week/30 Days Discharge Instructions: Apply moisturizing lotion as directed Topical: Gentamicin 1 x Per Week/30 Days Discharge Instructions: As directed by physician Prim Dressing: KerraCel Ag Gelling Fiber Dressing, 4x5 in (silver alginate) 1 x Per Week/30 Days ary Discharge Instructions: Apply silver alginate to wound bed as instructed Secondary Dressing: Woven Gauze Sponge, Non-Sterile 4x4 in 1 x Per Week/30 Days Discharge Instructions: Apply over primary dressing as directed. Secondary Dressing: ABD Pad, 5x9 1 x Per Week/30 Days Discharge Instructions: Apply over primary dressing as directed. Compression Wrap: ThreePress (3 layer compression wrap) 1 x Per Week/30 Days Discharge Instructions: Apply three layer compression as directed. Electronic Signature(s) Signed: 04/07/2021 4:15:21 PM By: Linton Ham MD Signed: 04/07/2021 5:35:45 PM By: Baruch Gouty RN, BSN Entered By: Baruch Gouty on 04/07/2021 15:01:46 -------------------------------------------------------------------------------- Problem List Details Patient Name: Date of Service: [name]. 04/07/2021 2:30 PM Medical Record Number: [id number] Patient Account Number: [id number] Date of Birth/Sex: Treating RN: 1935/07/27 (86 y.o. 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 04/07/2021 2:30 PM Medical Record Number: [id number] Patient Account Number: [id number] Date of Birth/Sex: Treating RN: 1935-03-27 (86 y.o. [name] Primary Care Provider: Patrecia Pace Other Clinician: Referring Provider: Treating Provider/Extender: Donita Brooks in Treatment: 5 Subjective History of Present Illness (HPI) ADMISSION 03/03/2021 This is a 86 year old woman who tells me she fell 13 years ago. She had lumps on her legs since then. There was never an open wound at the time. These "lumps" were hard but nonpainful. She was seen by dermatology at [facility] dermatology who did she Dr. Martin Majestic in September who performed a punch biopsy on 1 of these. We do not have these results. The area was sutured and then sometime later the sutures were removed and the wound dehisced. She developed redness and swelling in the area and received a course of Vantin as well as prior courses of Augmentin and doxycycline. A culture done at the end of December showed Corynebacterium which is a common skin contaminant and unlikely to have been a true pathogen. She is here for review of this. She has been using Silvadene cream Past medical history includes migraines hypertension arthritis she is a prediabetic with a last hemoglobin of 6 apparently to have bladder surgery early in February ABI in the left leg was not  obtained Because of1/17/2023; patient admitted to the clinic last week. 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 This underwent a biopsy by [facility] dermatology. The biopsy showed traumatic panniculitis and identified areas of fat necrosis calcification and old areas of hemorrhage. She arrived in the clinic with a probing area with underlying calcification. Swallowing I put this in compression. PCR culture showed Enterobacter and Actinotignum. The latter of which I never really heard of researching shows  a cause of UTIs in older adults. Treatment is 2 weeks of a beta-lactam antibiotic. I started the patient on cefdinir for the Enterobacter. She is allergic to quinolones and sulfa She continues to have erythema around the wound I marked the margins of this last time no different this time. She says dermatology injected this area with steroids in order to control this I do not know that its done anything 1/24; culture I did last time showed it once again Enterobacter cloacae I. The cefdinir that I gave her should have covered this we are also using topical gentamicin. Unfortunately she arrives with the wound looking somewhat worse. She has original wound the satellite wound and several small satellite wounds around this. Each 1 of these has underlying calcification. I spoken to the patient the length of her initial hard area was about the same length is what is opening now.. The erythema around the wound however looks somewhat better 1/31; she has completed antibiotics. 2 open wounds both with exposed calcification. She has an appointment with Dr. Claudia Desanctis of plastic surgery on Friday. She is complaining of less pain 2/7; the patient was kindly reviewed by Dr. Claudia Desanctis of plastic surgery. He has agreed to perform an operative debridement probably with intraoperative placement of a graft either ACell or Integra. So far we do not have a timeframe for this she is  waiting to hear 2/14; Dr. Claudia Desanctis is apparently waiting for insurance approval. Herbie [place] no real change in the wounded area. 3 small connected areas on the left anterior lower leg. We have been using silver alginate. No real improvement however Objective Constitutional Patient is hypertensive.. Pulse regular and within target range for patient.Marland Kitchen Respirations regular, non-labored and within target range.. Temperature is normal and within the target range for the patient.Marland Kitchen Appears in no distress. Vitals Time Taken: 2:26 PM, Height: 60 in, Weight: 155 lbs, BMI: 30.3, Temperature: 98.5 F, Pulse: 76 bpm, Respiratory Rate: 18 breaths/min, Blood Pressure: 188/80 mmHg. General Notes: Wound exam; left medial calf punched-out area which connects. There is underlying calcification here the entire area looks somewhat swollen and red but no active infection no tenderness Integumentary (Hair, Skin) Wound #1 status is Open. Original cause of wound was Surgical Injury. The date acquired was: 11/04/2020. The wound has been in treatment 5 weeks. The wound is located on the Left,Anterior Lower Leg. The wound measures 1.1cm length x 1.2cm width x 0.2cm depth; 1.037cm^2 area and 0.207cm^3 volume. There is Fat Layer (Subcutaneous Tissue) exposed. There is no tunneling or undermining noted. There is a medium amount of serosanguineous drainage noted. The wound margin is well defined and not attached to the wound base. There is large (67-100%) red, pink granulation within the wound bed. There is a small (1- 33%) amount of necrotic tissue within the wound bed including Adherent Slough. General Notes: calcifications present in wound bed Wound #2 status is Open. Original cause of wound was Gradually Appeared. The date acquired was: 03/10/2021. The wound has been in treatment 4 weeks. The wound is located on the Left,Proximal,Anterior Lower Leg. The wound measures 1cm length x 1cm width x 0.3cm depth; 0.785cm^2 area and  0.236cm^3 volume. There is Fat Layer (Subcutaneous Tissue) exposed. There is no tunneling or undermining noted. There is a medium amount of serosanguineous drainage noted. The wound margin is well defined and not attached to the wound base. There is large (67-100%) red granulation within the wound bed. There is a small (1-33%) amount of necrotic tissue within the  wound bed including Adherent Slough. 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 There was a Three Layer Compression Therapy Procedure by Baruch Gouty, RN. Post procedure Diagnosis Wound #1: Same as Pre-Procedure Plan Follow-up Appointments: Return Appointment in 1 week. - with Dr. Dellia Nims - room 3 Bathing/ Shower/ Hygiene: May shower with protection but do not get wound dressing(s) wet. - May use cast protector bag. Can get at [facility] or [facility] Edema Control - Lymphedema / SCD / Other: Elevate legs to the level of the heart or above for 30 minutes daily and/or when sitting, a frequency of: Avoid standing for long periods of time. Exercise regularly Additional Orders / Instructions: Follow Nutritious Diet WOUND #1: - Lower Leg Wound Laterality: Left, Anterior Cleanser: Soap and Water 1 x Per Week/30 Days Discharge Instructions: May shower and wash wound with dial antibacterial soap and water prior to dressing change. Cleanser: Wound Cleanser 1 x Per Week/30 Days Discharge Instructions: Cleanse the wound with wound cleanser prior to applying a clean dressing using gauze sponges, not tissue or cotton balls. Peri-Wound Care: Triamcinolone 15 (g) 1 x Per Week/30  Days Discharge Instructions: Use triamcinolone 15 (g) as directed Peri-Wound Care: Sween Lotion (Moisturizing lotion) 1 x Per Week/30 Days Discharge Instructions: Apply moisturizing lotion as directed Topical: Gentamicin 1 x Per Week/30 Days Discharge Instructions: As directed by physician Prim Dressing: KerraCel Ag Gelling Fiber Dressing, 4x5 in (silver alginate) 1 x Per Week/30 Days ary Discharge Instructions: Apply silver alginate to wound bed as instructed Secondary Dressing: Woven Gauze Sponge, Non-Sterile 4x4 in 1 x Per Week/30 Days Discharge Instructions: Apply over primary dressing as directed. Secondary Dressing: ABD Pad, 5x9 1 x Per Week/30 Days Discharge Instructions: Apply over primary dressing as directed. Com pression Wrap: ThreePress (3 layer compression wrap) 1 x Per Week/30 Days Discharge Instructions: Apply three layer compression as directed. WOUND #2: - Lower Leg Wound Laterality: Left, Anterior, Proximal Cleanser: Soap and Water 1 x Per Week/30 Days Discharge Instructions: May shower and wash wound with dial antibacterial soap and water prior to dressing change. Cleanser: Wound Cleanser 1 x Per Week/30 Days Discharge Instructions: Cleanse the wound with wound cleanser prior to applying a clean dressing using gauze sponges, not tissue or cotton balls. Peri-Wound Care: Triamcinolone 15 (g) 1 x Per Week/30 Days Discharge Instructions: Use triamcinolone 15 (g) as directed Peri-Wound Care: Sween Lotion (Moisturizing lotion) 1 x Per Week/30 Days Discharge Instructions: Apply moisturizing lotion as directed Topical: Gentamicin 1 x Per Week/30 Days Discharge Instructions: As directed by physician Prim Dressing: KerraCel Ag Gelling Fiber Dressing, 4x5 in (silver alginate) 1 x Per Week/30 Days ary Discharge Instructions: Apply silver alginate to wound bed as instructed Secondary Dressing: Woven Gauze Sponge, Non-Sterile 4x4 in 1 x Per Week/30 Days Discharge Instructions:  Apply over primary dressing as directed. Secondary Dressing: ABD Pad, 5x9 1 x Per Week/30 Days Discharge Instructions: Apply over primary dressing as directed. Com pression Wrap: ThreePress (3 layer compression wrap) 1 x Per Week/30 Days Discharge Instructions: Apply three layer compression as directed. 1. Using gentamicin and silver cell on these areas. 2. I have her in compression but she complains of the inner layer causing her itching will apply TCA and  kerlix as well as the top 2 layers. 3. Still waiting for approval for Dr. Claudia Desanctis to perform surgery on this area Electronic Signature(s) Signed: 04/07/2021 4:15:21 PM By: Linton Ham MD Entered By: Linton Ham on 04/07/2021 15:34:50 -------------------------------------------------------------------------------- SuperBill Details Patient Name: Date of Service: [name]. 04/07/2021 Medical Record Number: [id number] Patient Account Number: [id number] Date of Birth/Sex: Treating RN: 07-22-1935 (86 y.o. 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

## 2021-04-14 ENCOUNTER — Other Ambulatory Visit: Payer: Self-pay

## 2021-04-14 ENCOUNTER — Encounter (HOSPITAL_BASED_OUTPATIENT_CLINIC_OR_DEPARTMENT_OTHER): Payer: Medicare HMO | Admitting: Internal Medicine

## 2021-04-14 DIAGNOSIS — S81802A Unspecified open wound, left lower leg, initial encounter: Secondary | ICD-10-CM | POA: Insufficient documentation

## 2021-04-14 DIAGNOSIS — T8131XA Disruption of external operation (surgical) wound, not elsewhere classified, initial encounter: Secondary | ICD-10-CM | POA: Diagnosis not present

## 2021-04-14 NOTE — Progress Notes (Signed)
Surgical Instructions    Your procedure is scheduled on 04/21/21.  Report to [facility] Main Entrance "A" at 8:15 A.M., then check in with the Admitting office.  Call this number if you have problems the morning of surgery:  [phone]   If you have any questions prior to your surgery date call [phone]: Open Monday-Friday 8am-4pm    Remember:  Do not eat after midnight the night before your surgery  You may drink clear liquids until 7:15am the morning of your surgery.   Clear liquids allowed are: Water, Non-Citrus Juices (without pulp), Carbonated Beverages, Clear Tea, Black Coffee ONLY (NO MILK, CREAM OR POWDERED CREAMER of any kind), and Gatorade    Take these medicines the morning of surgery with A SIP OF WATER:  hydrALAZINE (APRESOLINE) isosorbide mononitrate (IMDUR) metoprolol tartrate (LOPRESSOR)    As of today, STOP taking any Aspirin (unless otherwise instructed by your surgeon) Aleve, Naproxen, Ibuprofen, Motrin, Advil, Goody's, BC's, all herbal medications, fish oil, and all vitamins.           Do not wear jewelry or makeup Do not wear lotions, powders, perfumes, or deodorant. Do not shave 48 hours prior to surgery.  Do not bring valuables to the hospital. Do not wear nail polish, gel polish, artificial nails, or any other type of covering on natural nails (fingers and toes) If you have artificial nails or gel coating that need to be removed by a nail salon, please have this removed prior to surgery. Artificial nails or gel coating may interfere with anesthesia's ability to adequately monitor your vital signs.  [place] is not responsible for any belongings or valuables. .   Do NOT Smoke (Tobacco/Vaping)  24 hours prior to your procedure  If you use a CPAP at night, you may bring your mask for your overnight stay.   Contacts, glasses, hearing aids, dentures or partials may not be worn into surgery, please bring cases for these belongings   For patients  admitted to the hospital, discharge time will be determined by your treatment team.   Patients discharged the day of surgery will not be allowed to drive home, and someone needs to stay with them for 24 hours.  NO VISITORS WILL BE ALLOWED IN PRE-OP WHERE PATIENTS ARE PREPPED FOR SURGERY.  ONLY 1 SUPPORT PERSON MAY BE PRESENT IN THE WAITING ROOM WHILE YOU ARE IN SURGERY.  IF YOU ARE TO BE ADMITTED, ONCE YOU ARE IN YOUR ROOM YOU WILL BE ALLOWED TWO (2) VISITORS. 1 (ONE) VISITOR MAY STAY OVERNIGHT BUT MUST ARRIVE TO THE ROOM BY 8pm.  Minor children may have two parents present. Special consideration for safety and communication needs will be reviewed on a case by case basis.  Special instructions:    Oral Hygiene is also important to reduce your risk of infection.  Remember - BRUSH YOUR TEETH THE MORNING OF SURGERY WITH YOUR REGULAR TOOTHPASTE   [place]- Preparing For Surgery  Before surgery, you can play an important role. Because skin is not sterile, your skin needs to be as free of germs as possible. You can reduce the number of germs on your skin by washing with CHG (chlorahexidine gluconate) Soap before surgery.  CHG is an antiseptic cleaner which kills germs and bonds with the skin to continue killing germs even after washing.     Please do not use if you have an allergy to CHG or antibacterial soaps. If your skin becomes reddened/irritated stop using the CHG.  Do  not shave (including legs and underarms) for at least 48 hours prior to first CHG shower. It is OK to shave your face.  Please follow these instructions carefully.     Shower the NIGHT BEFORE SURGERY and the MORNING OF SURGERY with CHG Soap.   If you chose to wash your hair, wash your hair first as usual with your normal shampoo. After you shampoo, rinse your hair and body thoroughly to remove the shampoo.  Then [employer] and genitals (private parts) with your normal soap and rinse thoroughly to remove soap.  After that Use  CHG Soap as you would any other liquid soap. You can apply CHG directly to the skin and wash gently with a scrungie or a clean washcloth.   Apply the CHG Soap to your body ONLY FROM THE NECK DOWN.  Do not use on open wounds or open sores. Avoid contact with your eyes, ears, mouth and genitals (private parts). Wash Face and genitals (private parts)  with your normal soap.   Wash thoroughly, paying special attention to the area where your surgery will be performed.  Thoroughly rinse your body with warm water from the neck down.  DO NOT shower/wash with your normal soap after using and rinsing off the CHG Soap.  Pat yourself dry with a CLEAN TOWEL.  Wear CLEAN PAJAMAS to bed the night before surgery  Place CLEAN SHEETS on your bed the night before your surgery  DO NOT SLEEP WITH PETS.   Day of Surgery: Take a shower with CHG soap. Wear Clean/Comfortable clothing the morning of surgery Do not apply any deodorants/lotions.   Remember to brush your teeth WITH YOUR REGULAR TOOTHPASTE.    COVID testing  If you are going to stay overnight or be admitted after your procedure/surgery and require a pre-op COVID test, please follow these instructions after your COVID test   You are not required to quarantine however you are required to wear a well-fitting mask when you are out and around people not in your household.  If your mask becomes wet or soiled, replace with a new one.  Wash your hands often with soap and water for 20 seconds or clean your hands with an alcohol-based hand sanitizer that contains at least 60% alcohol.  Do not share personal items.  Notify your provider: if you are in close contact with someone who has COVID  or if you develop a fever of 100.4 or greater, sneezing, cough, sore throat, shortness of breath or body aches.    Please read over the following fact sheets that you were given.

## 2021-04-14 NOTE — Progress Notes (Signed)
[name], [name] ([id number]) Visit Report for 04/14/2021 HPI Details Patient Name: Date of Service: [name], [name] 04/14/2021 2:30 PM Medical Record Number: [id number] Patient Account Number: [id number] Date of Birth/Sex: Treating RN: 03/07/1935 (86 y.o. [name] Primary Care Provider: Patrecia Harper Other Clinician: Referring Provider: Treating Provider/Extender: [name] in Treatment: 6 History of Present Illness HPI Description: ADMISSION 03/03/2021 This is a 86 year old woman who tells me she fell 13 years ago. She had lumps on her legs since then. There was never an open wound at the time. These "lumps" were hard but nonpainful. She was seen by dermatology at [facility] dermatology who did she [name] in September who performed a punch biopsy on 1 of these. We do not have these results. The area was sutured and then sometime later the sutures were removed and the wound dehisced. She developed redness and swelling in the area and received a course of Vantin as well as prior courses of Augmentin and doxycycline. A culture done at the end of December showed Corynebacterium which is a common skin contaminant and unlikely to have been a true pathogen. She is here for review of this. She has been using Silvadene cream Past medical history includes migraines hypertension arthritis she is a prediabetic with a last hemoglobin of 6 apparently to have bladder Harper early in February ABI in the left leg was not obtained Because of1/17/2023; patient admitted to the clinic last week. 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 This underwent a biopsy by [facility] dermatology. The biopsy showed traumatic panniculitis and identified areas of fat necrosis calcification and old areas of hemorrhage. She arrived in the clinic with a probing area with underlying calcification. Swallowing I put this in compression. PCR  culture showed Enterobacter and Actinotignum. The latter of which I never really heard of researching shows a cause of UTIs in older adults. Treatment is 2 weeks of a beta-lactam antibiotic. I started the patient on cefdinir for the Enterobacter. She is allergic to quinolones and sulfa She continues to have erythema around the wound I marked the margins of this last time no different this time. She says dermatology injected this area with steroids in order to control this I do not know that its done anything 1/24; culture I did last time showed it once again Enterobacter cloacae I. The cefdinir that I gave her should have covered this we are also using topical gentamicin. Unfortunately she arrives with the wound looking somewhat worse. She has original wound the satellite wound and several small satellite wounds around this. Each 1 of these has underlying calcification. I spoken to the patient the length of her initial hard area was about the same length is what is opening now.. The erythema around the wound however looks somewhat better 1/31; she has completed antibiotics. 2 open wounds both with exposed calcification. She has an appointment with [name] of plastic Harper on Friday. She is complaining of less pain 2/7; the patient was kindly reviewed by [name] of plastic Harper. He has agreed to perform an operative debridement probably with intraoperative placement of a graft either ACell or Integra. So far we do not have a timeframe for this she is waiting to hear 2/14; [name] is apparently waiting for insurance approval. [name] no real change in the wounded area. 3 small connected areas on the left anterior lower leg. We have been using silver alginate. No real  improvement however 2/21; the patient apparently saw another plastic surgeon in the office. She is going to have Harper on 2/28 which will include operative debridement and I think placement of either Integra or a cell. I don't  think there is any point and bring her back here before this. He is seeing [name] in 2 days time. We'll put a border foam over silver alginate hopefully will take care of things before she has her Harper. Electronic Signature(s) Signed: 04/14/2021 4:44:59 PM By: Diane Ham MD Entered By: [name] on 04/14/2021 15:27:10 -------------------------------------------------------------------------------- Physical Exam Details Patient Name: Date of Service: [name] 04/14/2021 2:30 PM Medical Record Number: [id number] Patient Account Number: [id number] Date of Birth/Sex: Treating RN: [birth year] (86 y.o. [name] Primary Care Provider: Patrecia Harper Other Clinician: Referring Provider: Treating Provider/Extender: [name] in Treatment: 6 Constitutional Patient is hypertensive.. Pulse regular and within target range for patient.Marland Kitchen Respirations regular, non-labored and within target range.. Temperature is normal and within the target range for the patient.Marland Kitchen Appears in no distress. Notes would exam; left medial calf. Small punched out areas with underlying calcification. There is surrounding calcification without an open wound as well. There is no evidence of infection here. Electronic Signature(s) Signed: 04/14/2021 4:44:59 PM By: Diane Ham MD Entered By: [name] on 04/14/2021 15:28:30 -------------------------------------------------------------------------------- Physician Orders Details Patient Name: Date of Service: [name]. 04/14/2021 2:30 PM Medical Record Number: [id number] Patient Account Number: [id number] Date of Birth/Sex: Treating RN: Apr 28, 1935 (86 y.o. [name] Primary Care Provider: Patrecia Harper Other Clinician: Referring Provider: Treating Provider/Extender: [name] in Treatment: 6 Verbal / Phone Orders: No Diagnosis Coding Follow-up  Appointments Other: - [name]/[name] *** Plastic Sugery [name] 04/21/21**** Call the [facility] after Harper Bathing/ Shower/ Hygiene May shower with protection but do not get wound dressing(s) wet. - May use cast protector bag. Can get at [facility] or [facility] Edema Control - Lymphedema / SCD / Other Elevate legs to the level of the heart or above for 30 minutes daily and/or when sitting, a frequency of: Avoid standing for long periods of time. Exercise regularly Additional Orders / Instructions Follow Nutritious Diet Wound Treatment Wound #1 - Lower Leg Wound Laterality: Left, Anterior Cleanser: Soap and Water 1 x Per Week/30 Days Discharge Instructions: May shower and wash wound with dial antibacterial soap and water prior to dressing change. Cleanser: Wound Cleanser 1 x Per Week/30 Days Discharge Instructions: Cleanse the wound with wound cleanser prior to applying a clean dressing using gauze sponges, not tissue or cotton balls. Peri-Wound Care: Triamcinolone 15 (g) 1 x Per Week/30 Days Discharge Instructions: Use triamcinolone 15 (g) as directed Peri-Wound Care: Sween Lotion (Moisturizing lotion) 1 x Per Week/30 Days Discharge Instructions: Apply moisturizing lotion as directed Prim Dressing: KerraCel Ag Gelling Fiber Dressing, 4x5 in (silver alginate) 1 x Per Week/30 Days ary Discharge Instructions: Apply silver alginate to wound bed as instructed Secondary Dressing: Woven Gauze Sponge, Non-Sterile 4x4 in 1 x Per Week/30 Days Discharge Instructions: Apply over primary dressing as directed. Secondary Dressing: Bordered Gauze, 2x2 in 1 x Per Week/30 Days Discharge Instructions: Apply over primary dressing as directed. Wound #2 - Lower Leg Wound Laterality: Left, Anterior, Proximal Cleanser: Soap and Water 1 x Per Week/30 Days Discharge Instructions: May shower and wash wound with dial antibacterial soap and water prior to dressing change. Cleanser: Wound Cleanser 1 x Per  Week/30 Days Discharge Instructions: Cleanse the  wound with wound cleanser prior to applying a clean dressing using gauze sponges, not tissue or cotton balls. Peri-Wound Care: Triamcinolone 15 (g) 1 x Per Week/30 Days Discharge Instructions: Use triamcinolone 15 (g) as directed Peri-Wound Care: Sween Lotion (Moisturizing lotion) 1 x Per Week/30 Days Discharge Instructions: Apply moisturizing lotion as directed Prim Dressing: KerraCel Ag Gelling Fiber Dressing, 4x5 in (silver alginate) 1 x Per Week/30 Days ary Discharge Instructions: Apply silver alginate to wound bed as instructed Secondary Dressing: Woven Gauze Sponge, Non-Sterile 4x4 in 1 x Per Week/30 Days Discharge Instructions: Apply over primary dressing as directed. Secondary Dressing: Bordered Gauze, 2x2 in 1 x Per Week/30 Days Discharge Instructions: Apply over primary dressing as directed. Electronic Signature(s) Signed: 04/14/2021 4:34:03 PM By: Dellie Catholic RN Signed: 04/14/2021 4:44:59 PM By: Diane Ham MD Entered By: Dellie Catholic on 04/14/2021 15:29:19 -------------------------------------------------------------------------------- Problem List Details Patient Name: Date of Service: [name]. 04/14/2021 2:30 PM Medical Record Number: [id number] Patient Account Number: [id number] Date of Birth/Sex: Treating RN: December 15, 1935 (86 y.o. 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 04/14/2021 2:30 PM Medical Record Number: [id number] Patient Account Number: [id number] Date of Birth/Sex: Treating RN: 1935-10-13 (86 y.o. [name] Primary Care Provider: Patrecia Harper Other Clinician: Referring Provider: Treating Provider/Extender: [name] in Treatment: 6 Subjective History of Present Illness (HPI) ADMISSION 03/03/2021 This is a 86 year old woman who tells me she fell 13 years ago. She had lumps on her legs since then. There was never an open wound at the time. These "lumps" were hard but nonpainful. She was seen by dermatology at [facility] dermatology who did she [name] in September who performed a punch biopsy on 1 of these. We do not have these results. The area was sutured and then sometime later the sutures were removed and the wound dehisced. She developed redness and swelling in the area and received a course of Vantin as well as prior courses of Augmentin and doxycycline. A culture done at the end of December showed Corynebacterium which is a common skin contaminant and unlikely to have been a true pathogen. She is here for review of this. She has been using Silvadene cream Past medical history includes migraines hypertension arthritis she is a prediabetic with a last hemoglobin of 6 apparently to have bladder Harper early in February ABI in the left leg was not obtained Because of1/17/2023; patient admitted to the clinic last week. 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 This underwent a biopsy by [facility] dermatology. The biopsy showed traumatic panniculitis and identified areas of fat necrosis  calcification and old areas of hemorrhage. She arrived in the clinic with a probing area with underlying calcification. Swallowing I put this in compression. PCR culture showed Enterobacter and Actinotignum. The latter of which I never really heard of researching shows a cause of UTIs in older adults. Treatment is 2 weeks of a  beta-lactam antibiotic. I started the patient on cefdinir for the Enterobacter. She is allergic to quinolones and sulfa She continues to have erythema around the wound I marked the margins of this last time no different this time. She says dermatology injected this area with steroids in order to control this I do not know that its done anything 1/24; culture I did last time showed it once again Enterobacter cloacae I. The cefdinir that I gave her should have covered this we are also using topical gentamicin. Unfortunately she arrives with the wound looking somewhat worse. She has original wound the satellite wound and several small satellite wounds around this. Each 1 of these has underlying calcification. I spoken to the patient the length of her initial hard area was about the same length is what is opening now.. The erythema around the wound however looks somewhat better 1/31; she has completed antibiotics. 2 open wounds both with exposed calcification. She has an appointment with [name] of plastic Harper on Friday. She is complaining of less pain 2/7; the patient was kindly reviewed by [name] of plastic Harper. He has agreed to perform an operative debridement probably with intraoperative placement of a graft either ACell or Integra. So far we do not have a timeframe for this she is waiting to hear 2/14; [name] is apparently waiting for insurance approval. [name] no real change in the wounded area. 3 small connected areas on the left anterior lower leg. We have been using silver alginate. No real improvement however 2/21; the patient apparently saw another plastic  surgeon in the office. She is going to have Harper on 2/28 which will include operative debridement and I think placement of either Integra or a cell. I don't think there is any point and bring her back here before this. He is seeing [name] in 2 days time. We'll put a border foam over silver alginate hopefully will take care of things before she has her Harper. Objective Constitutional Patient is hypertensive.. Pulse regular and within target range for patient.Marland Kitchen Respirations regular, non-labored and within target range.. Temperature is normal and within the target range for the patient.Marland Kitchen Appears in no distress. Vitals Time Taken: 2:37 PM, Height: 60 in, Weight: 155 lbs, BMI: 30.3, Temperature: 98.3 F, Pulse: 62 bpm, Respiratory Rate: 18 breaths/min, Blood Pressure: 178/87 mmHg. General Notes: would exam; left medial calf. Small punched out areas with underlying calcification. There is surrounding calcification without an open wound as well. There is no evidence of infection here. Integumentary (Hair, Skin) Wound #1 status is Open. Original cause of wound was Surgical Injury. The date acquired was: 11/04/2020. The wound has been in treatment 6 weeks. The wound is located on the Left,Anterior Lower Leg. The wound measures 1cm length x 1.2cm width x 0.3cm depth; 0.942cm^2 area and 0.283cm^3 volume. There is Fat Layer (Subcutaneous Tissue) exposed. There is a medium amount of serosanguineous drainage noted. The wound margin is well defined and not attached to the wound base. There is large (67-100%) red, pink granulation within the wound bed. There is a small (1-33%) amount of necrotic tissue within the wound bed including Adherent Slough. Wound #2 status is Open. Original cause of wound was Gradually Appeared. The date acquired was: 03/10/2021. The wound has been in treatment 5 weeks. The wound is located on the Left,Proximal,Anterior Lower Leg. The wound measures 0.7cm length x 1.1cm width x  0.3cm depth; 0.605cm^2 area and 0.181cm^3 volume. There is Fat Layer (Subcutaneous Tissue) exposed. There  is a medium amount of serosanguineous drainage noted. The wound margin is well defined and not attached to the wound base. There is large (67-100%) red granulation within the wound bed. There is a small (1-33%) amount of necrotic tissue within the wound bed including Adherent Slough. 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 - May use cast protector bag. Can get at [facility] or [facility] Edema Control - Lymphedema / SCD / Other: Elevate legs to the level of the heart or above for 30 minutes daily and/or when sitting, a frequency of: Avoid standing for long periods of time. Exercise regularly Additional Orders / Instructions: Follow Nutritious Diet WOUND #1: - Lower Leg Wound Laterality: Left, Anterior Cleanser: Soap and Water 1 x Per Week/30 Days Discharge Instructions: May shower and wash wound with dial antibacterial soap and water prior to dressing change. Cleanser: Wound Cleanser 1 x Per Week/30 Days Discharge Instructions: Cleanse the wound with wound cleanser prior to applying a clean dressing using gauze sponges, not tissue or cotton balls. Peri-Wound Care: Triamcinolone 15 (g) 1 x Per Week/30 Days Discharge Instructions: Use triamcinolone 15 (g) as directed Peri-Wound Care: Sween Lotion (Moisturizing lotion) 1 x Per Week/30 Days Discharge Instructions: Apply moisturizing lotion as directed Prim Dressing: KerraCel Ag Gelling Fiber Dressing, 4x5 in (silver alginate) 1 x Per Week/30 Days ary Discharge Instructions: Apply silver alginate to  wound bed as instructed Secondary Dressing: Woven Gauze Sponge, Non-Sterile 4x4 in 1 x Per Week/30 Days Discharge Instructions: Apply over primary dressing as directed. Secondary Dressing: Bordered Gauze, 4x4 in 1 x Per Week/30 Days Discharge Instructions: Apply over primary dressing as directed. WOUND #2: - Lower Leg Wound Laterality: Left, Anterior, Proximal Cleanser: Soap and Water 1 x Per Week/30 Days Discharge Instructions: May shower and wash wound with dial antibacterial soap and water prior to dressing change. Cleanser: Wound Cleanser 1 x Per Week/30 Days Discharge Instructions: Cleanse the wound with wound cleanser prior to applying a clean dressing using gauze sponges, not tissue or cotton balls. Peri-Wound Care: Triamcinolone 15 (g) 1 x Per Week/30 Days Discharge Instructions: Use triamcinolone 15 (g) as directed Peri-Wound Care: Sween Lotion (Moisturizing lotion) 1 x Per Week/30 Days Discharge Instructions: Apply moisturizing lotion as directed Prim Dressing: KerraCel Ag Gelling Fiber Dressing, 4x5 in (silver alginate) 1 x Per Week/30 Days ary Discharge Instructions: Apply silver alginate to wound bed as instructed Secondary Dressing: Woven Gauze Sponge, Non-Sterile 4x4 in 1 x Per Week/30 Days Discharge Instructions: Apply over primary dressing as directed. Secondary Dressing: Bordered Gauze, 4x4 in 1 x Per Week/30 Days Discharge Instructions: Apply over primary dressing as directed. #1 we'll use silver alginate in border foam. #2 the patient sees plastic Harper in 2 days the dressing can stand about time and then now given instructions from there #3 I think she is going to have intraoperative Integra placed. We will standby in the event that this doesn't close over and she can return to see [place] when either she wishes this or she is referred back from plastic Harper Electronic Signature(s) Signed: 04/14/2021 4:44:59 PM By: Diane Ham MD Entered By: [name] on  04/14/2021 15:29:38 -------------------------------------------------------------------------------- SuperBill Details Patient Name: Date of Service: [name] 04/14/2021 Medical Record Number: [id number] Patient  Account Number: [id number] Date of Birth/Sex: Treating RN: 1935-03-03 (86 y.o. 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 Facility Procedures CPT4 Code: [id number] Description: 99214 - WOUND CARE VISIT-LEV 4 EST PT Modifier: Quantity: 1 Physician Procedures : CPT4 Code Description Modifier [id number] - WC PHYS LEVEL 2 - EST PT ICD-10 Diagnosis Description T81.31XD Disruption of external operation (surgical) wound, not elsewhere classified, subsequent encounter L97.228 Non-pressure chronic ulcer of left  calf with other specified severity 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

## 2021-04-15 ENCOUNTER — Other Ambulatory Visit: Payer: Self-pay

## 2021-04-15 ENCOUNTER — Encounter (HOSPITAL_COMMUNITY)
Admission: RE | Admit: 2021-04-15 | Discharge: 2021-04-15 | Disposition: A | Discharge status: Home / Self Care | Payer: Medicare HMO | Source: Ambulatory Visit | Attending: Plastic Surgery | Admitting: Plastic Surgery

## 2021-04-15 ENCOUNTER — Encounter (HOSPITAL_COMMUNITY): Payer: Self-pay

## 2021-04-15 VITALS — BP 168/81 | HR 75 | Temp 97.8°F | Resp 18 | Ht 60.0 in | Wt 149.3 lb

## 2021-04-15 DIAGNOSIS — Z01818 Encounter for other preprocedural examination: Secondary | ICD-10-CM

## 2021-04-15 HISTORY — DX: Personal history of urinary calculi: Z87.442

## 2021-04-15 HISTORY — DX: Essential (primary) hypertension: I10

## 2021-04-15 HISTORY — DX: Unspecified osteoarthritis, unspecified site: M19.90

## 2021-04-15 HISTORY — DX: Nausea with vomiting, unspecified: Z98.890

## 2021-04-15 HISTORY — DX: Nausea with vomiting, unspecified: R11.2

## 2021-04-15 HISTORY — DX: Malignant (primary) neoplasm, unspecified: C80.1

## 2021-04-15 HISTORY — DX: Other complications of anesthesia, initial encounter: T88.59XA

## 2021-04-15 LAB — TYPE AND SCREEN
ABO/RH(D): A POS
Antibody Screen: NEGATIVE

## 2021-04-15 NOTE — Progress Notes (Signed)
Surgical Instructions    Your procedure is scheduled on 04/21/21.  Report to [facility] Main Entrance "A" at 8:15 A.M., then check in with the Admitting office.  Call this number if you have problems the morning of surgery:  [phone]   If you have any questions prior to your surgery date call [phone]: Open Monday-Friday 8am-4pm    Remember:  Do not eat or drink after midnight the night before your surgery     Take these medicines the morning of surgery with A SIP OF WATER:  hydrALAZINE (APRESOLINE) isosorbide mononitrate (IMDUR) metoprolol tartrate (LOPRESSOR)    As of today, STOP taking any Aspirin (unless otherwise instructed by your surgeon) Aleve, Naproxen, Ibuprofen, Motrin, Advil, Goody's, BC's, all herbal medications, fish oil, and all vitamins.           Do not wear jewelry or makeup Do not wear lotions, powders, perfumes, or deodorant. Do not shave 48 hours prior to surgery.  Do not bring valuables to the hospital. Do not wear nail polish, gel polish, artificial nails, or any other type of covering on natural nails (fingers and toes) If you have artificial nails or gel coating that need to be removed by a nail salon, please have this removed prior to surgery. Artificial nails or gel coating may interfere with anesthesia's ability to adequately monitor your vital signs.  [facility] is not responsible for any belongings or valuables. .   Do NOT Smoke (Tobacco/Vaping)  24 hours prior to your procedure  If you use a CPAP at night, you may bring your mask for your overnight stay.   Contacts, glasses, hearing aids, dentures or partials may not be worn into surgery, please bring cases for these belongings   For patients admitted to the hospital, discharge time will be determined by your treatment team.   Patients discharged the day of surgery will not be allowed to drive home, and someone needs to stay with them for 24 hours.  NO VISITORS WILL BE ALLOWED IN  PRE-OP WHERE PATIENTS ARE PREPPED FOR SURGERY.  ONLY 1 SUPPORT PERSON MAY BE PRESENT IN THE WAITING ROOM WHILE YOU ARE IN SURGERY.  IF YOU ARE TO BE ADMITTED, ONCE YOU ARE IN YOUR ROOM YOU WILL BE ALLOWED TWO (2) VISITORS. 1 (ONE) VISITOR MAY STAY OVERNIGHT BUT MUST ARRIVE TO THE ROOM BY 8pm.  Minor children may have two parents present. Special consideration for safety and communication needs will be reviewed on a case by case basis.  Special instructions:    Oral Hygiene is also important to reduce your risk of infection.  Remember - BRUSH YOUR TEETH THE MORNING OF SURGERY WITH YOUR REGULAR TOOTHPASTE   [facility]- Preparing For Surgery  Before surgery, you can play an important role. Because skin is not sterile, your skin needs to be as free of germs as possible. You can reduce the number of germs on your skin by washing with CHG (chlorahexidine gluconate) Soap before surgery.  CHG is an antiseptic cleaner which kills germs and bonds with the skin to continue killing germs even after washing.     Please do not use if you have an allergy to CHG or antibacterial soaps. If your skin becomes reddened/irritated stop using the CHG.  Do not shave (including legs and underarms) for at least 48 hours prior to first CHG shower. It is OK to shave your face.  Please follow these instructions carefully.     Shower the [employer] SURGERY and  the MORNING OF SURGERY with CHG Soap.   If you chose to wash your hair, wash your hair first as usual with your normal shampoo. After you shampoo, rinse your hair and body thoroughly to remove the shampoo.  Then [employer] and genitals (private parts) with your normal soap and rinse thoroughly to remove soap.  After that Use CHG Soap as you would any other liquid soap. You can apply CHG directly to the skin and wash gently with a scrungie or a clean washcloth.   Apply the CHG Soap to your body ONLY FROM THE NECK DOWN.  Do not use on open wounds or open sores. Avoid  contact with your eyes, ears, mouth and genitals (private parts). Wash Face and genitals (private parts)  with your normal soap.   Wash thoroughly, paying special attention to the area where your surgery will be performed.  Thoroughly rinse your body with warm water from the neck down.  DO NOT shower/wash with your normal soap after using and rinsing off the CHG Soap.  Pat yourself dry with a CLEAN TOWEL.  Wear CLEAN PAJAMAS to bed the night before surgery  Place CLEAN SHEETS on your bed the night before your surgery  DO NOT SLEEP WITH PETS.   Day of Surgery: Take a shower with CHG soap. Wear Clean/Comfortable clothing the morning of surgery Do not apply any deodorants/lotions.   Remember to brush your teeth WITH YOUR REGULAR TOOTHPASTE.    COVID testing  If you are going to stay overnight or be admitted after your procedure/surgery and require a pre-op COVID test, please follow these instructions after your COVID test   You are not required to quarantine however you are required to wear a well-fitting mask when you are out and around people not in your household.  If your mask becomes wet or soiled, replace with a new one.  Wash your hands often with soap and water for 20 seconds or clean your hands with an alcohol-based hand sanitizer that contains at least 60% alcohol.  Do not share personal items.  Notify your provider: if you are in close contact with someone who has COVID  or if you develop a fever of 100.4 or greater, sneezing, cough, sore throat, shortness of breath or body aches.    Please read over the following fact sheets that you were given.

## 2021-04-15 NOTE — Progress Notes (Signed)
BRE, PECINA ([id number]) Visit Report for 04/14/2021 Arrival Information Details Patient Name: Date of Service: [name], [name] 04/14/2021 2:30 PM Medical Record Number: [id number] Patient Account Number: [id number] Date of Birth/Sex: Treating RN: 05-21-1935 (86 y.o. [name] Primary Care Ambrosio Reuter: Patrecia Pace Other Clinician: Referring Cailey Trigueros: Treating Maille Halliwell/Extender: Donita Brooks in Treatment: 6 Visit Information History Since Last Visit Added or deleted any medications: No Patient Arrived: Ambulatory Any new allergies or adverse reactions: No Arrival Time: 14:35 Had a fall or experienced change in No Accompanied By: self activities of daily living that may affect Transfer Assistance: None risk of falls: Patient Identification Verified: Yes Signs or symptoms of abuse/neglect since last visito No Secondary Verification Process Completed: Yes Hospitalized since last visit: No Patient Requires Transmission-Based Precautions: No Implantable device outside of the clinic excluding No Patient Has Alerts: Yes cellular tissue based products placed in the center Patient Alerts: L ABI: Non Comp since last visit: Has Dressing in Place as Prescribed: Yes Pain Present Now: No Electronic Signature(s) Signed: 04/15/2021 8:22:14 AM By: Sandre Kitty Entered By: Sandre Kitty on 04/14/2021 14:37:01 -------------------------------------------------------------------------------- Clinic Level of Care Assessment Details Patient Name: Date of Service: [name], [name] 04/14/2021 2:30 PM Medical Record Number: [id number] Patient Account Number: [id number] Date of Birth/Sex: Treating RN: Aug 07, 1935 (86 y.o. [name] Primary Care Amillion Scobee: Patrecia Pace Other Clinician: Referring Jeet Shough: Treating Pamela Maddy/Extender: Donita Brooks in Treatment: 6 Clinic Level of Care Assessment Items TOOL 4 Quantity  Score X- 1 0 Use when only an EandM is performed on FOLLOW-UP visit ASSESSMENTS - Nursing Assessment / Reassessment X- 1 10 Reassessment of Co-morbidities (includes updates in patient status) X- 1 5 Reassessment of Adherence to Treatment Plan ASSESSMENTS - Wound and Skin A ssessment / Reassessment []  - 0 Simple Wound Assessment / Reassessment - one wound X- 2 5 Complex Wound Assessment / Reassessment - multiple wounds []  - 0 Dermatologic / Skin Assessment (not related to wound area) ASSESSMENTS - Focused Assessment []  - 0 Circumferential Edema Measurements - multi extremities []  - 0 Nutritional Assessment / Counseling / Intervention []  - 0 Lower Extremity Assessment (monofilament, tuning fork, pulses) []  - 0 Peripheral Arterial Disease Assessment (using hand held doppler) ASSESSMENTS - Ostomy and/or Continence Assessment and Care []  - 0 Incontinence Assessment and Management []  - 0 Ostomy Care Assessment and Management (repouching, etc.) PROCESS - Coordination of Care []  - 0 Simple Patient / Family Education for ongoing care X- 1 20 Complex (extensive) Patient / Family Education for ongoing care X- 1 10 Staff obtains Programmer, systems, Records, T Results / Process Orders est X- 1 10 Staff telephones HHA, Nursing Homes / Clarify orders / etc []  - 0 Routine Transfer to another Facility (non-emergent condition) []  - 0 Routine Hospital Admission (non-emergent condition) []  - 0 New Admissions / Biomedical engineer / Ordering NPWT Apligraf, etc. , []  - 0 Emergency Hospital Admission (emergent condition) X- 1 10 Simple Discharge Coordination []  - 0 Complex (extensive) Discharge Coordination PROCESS - Special Needs []  - 0 Pediatric / Minor Patient Management []  - 0 Isolation Patient Management []  - 0 Hearing / Language / Visual special needs []  - 0 Assessment of Community assistance (transportation, D/C planning, etc.) []  - 0 Additional assistance / Altered  mentation []  - 0 Support Surface(s) Assessment (bed, cushion, seat, etc.) INTERVENTIONS - Wound Cleansing / Measurement []  - 0 Simple Wound Cleansing - one wound X- 2 5 Complex Wound Cleansing - multiple wounds X-  1 5 Wound Imaging (photographs - any number of wounds) []  - 0 Wound Tracing (instead of photographs) []  - 0 Simple Wound Measurement - one wound []  - 0 Complex Wound Measurement - multiple wounds INTERVENTIONS - Wound Dressings []  - 0 Small Wound Dressing one or multiple wounds X- 2 15 Medium Wound Dressing one or multiple wounds []  - 0 Large Wound Dressing one or multiple wounds []  - 0 Application of Medications - topical []  - 0 Application of Medications - injection INTERVENTIONS - Miscellaneous []  - 0 External ear exam []  - 0 Specimen Collection (cultures, biopsies, blood, body fluids, etc.) []  - 0 Specimen(s) / Culture(s) sent or taken to Lab for analysis []  - 0 Patient Transfer (multiple staff / Civil Service fast streamer / Similar devices) []  - 0 Simple Staple / Suture removal (25 or less) []  - 0 Complex Staple / Suture removal (26 or more) []  - 0 Hypo / Hyperglycemic Management (close monitor of Blood Glucose) []  - 0 Ankle / Brachial Index (ABI) - do not check if billed separately X- 1 5 Vital Signs Has the patient been seen at the hospital within the last three years: Yes Total Score: 125 Level Of Care: New/Established - Level 4 Electronic Signature(s) Signed: 04/14/2021 4:34:03 PM By: Dellie Catholic RN Entered By: Dellie Catholic on 04/14/2021 16:31:14 -------------------------------------------------------------------------------- Encounter Discharge Information Details Patient Name: Date of Service: Diane Ano. 04/14/2021 2:30 PM Medical Record Number: [id number] Patient Account Number: [id number] Date of Birth/Sex: Treating RN: 1935-04-14 (86 y.o. [name] Primary Care Sophiana Milanese: Patrecia Pace Other Clinician: Referring  Kenijah Benningfield: Treating Liza Czerwinski/Extender: Donita Brooks in Treatment: 6 Encounter Discharge Information Items Discharge Condition: Stable Ambulatory Status: Ambulatory Discharge Destination: Home Transportation: Private Auto Accompanied By: self Schedule Follow-up Appointment: Yes Clinical Summary of Care: Patient Declined Electronic Signature(s) Signed: 04/14/2021 4:34:03 PM By: Dellie Catholic RN Entered By: Dellie Catholic on 04/14/2021 16:33:30 -------------------------------------------------------------------------------- Lower Extremity Assessment Details Patient Name: Date of Service: [name], [name] 04/14/2021 2:30 PM Medical Record Number: [id number] Patient Account Number: [id number] Date of Birth/Sex: Treating RN: 1935-09-10 (86 y.o. [name] Primary Care Kiarrah Rausch: Patrecia Pace Other Clinician: Referring Abhi Moccia: Treating Hoy Fallert/Extender: Donita Brooks in Treatment: 6 Edema Assessment Assessed: Shirlyn Goltz: No] [Right: No] Edema: [Left: Ye] [Right: s] Calf Left: Right: Point of Measurement: 28 cm From Medial Instep 39 cm Ankle Left: Right: Point of Measurement: 8 cm From Medial Instep 23.6 cm Knee To Floor Left: Right: From Medial Instep 46 cm Vascular Assessment Pulses: Dorsalis Pedis Palpable: [Left:Yes] Electronic Signature(s) Signed: 04/14/2021 4:34:03 PM By: Dellie Catholic RN Entered By: Dellie Catholic on 04/14/2021 15:09:43 -------------------------------------------------------------------------------- Multi Wound Chart Details Patient Name: Date of Service: Diane Ano. 04/14/2021 2:30 PM Medical Record Number: [id number] Patient Account Number: [id number] Date of Birth/Sex: Treating RN: 1936-01-11 (86 y.o. [name] Primary Care Jadee Golebiewski: Patrecia Pace Other Clinician: Referring Latoy Labriola: Treating Berthe Oley/Extender: Donita Brooks in Treatment:  6 Vital Signs Height(in): 60 Pulse(bpm): 33 Weight(lbs): 155 Blood Pressure(mmHg): 178/87 Body Mass Index(BMI): 30.3 Temperature(F): 98.3 Respiratory Rate(breaths/min): 18 Photos: [N/A:N/A] Left, Anterior Lower Leg Left, Proximal, Anterior Lower Leg N/A Wound Location: Surgical Injury Gradually Appeared N/A Wounding Event: Infection - not elsewhere classified Atypical N/A Primary Etiology: Cataracts, Hypertension, Cataracts, Hypertension, N/A Comorbid History: Osteoarthritis Osteoarthritis 11/04/2020 03/10/2021 N/A Date Acquired: 6 5 N/A Weeks of Treatment: Open Open N/A Wound Status: No No N/A Wound Recurrence: No Yes N/A Clustered Wound: N/A 2 N/A Clustered Quantity: 1x1.2x0.3  0.7x1.1x0.3 N/A Measurements L x W x D (cm) 0.942 0.605 N/A A (cm) : rea 0.283 0.181 N/A Volume (cm) : 22.10% -60.50% N/A % Reduction in Area: 22.00% 3.70% N/A % Reduction in Volume: Full Thickness With Exposed Support Full Thickness Without Exposed N/A Classification: Structures Support Structures Medium Medium N/A Exudate Amount: Serosanguineous Serosanguineous N/A Exudate Type: red, brown red, brown N/A Exudate Color: Well defined, not attached Well defined, not attached N/A Wound Margin: Large (67-100%) Large (67-100%) N/A Granulation Amount: Red, Pink Red N/A Granulation Quality: Small (1-33%) Small (1-33%) N/A Necrotic Amount: Fat Layer (Subcutaneous Tissue): Yes Fat Layer (Subcutaneous Tissue): Yes N/A Exposed Structures: Fascia: No Fascia: No Tendon: No Tendon: No Muscle: No Muscle: No Joint: No Joint: No Bone: No Bone: No Small (1-33%) Small (1-33%) N/A Epithelialization: Treatment Notes Electronic Signature(s) Signed: 04/14/2021 4:34:03 PM By: Dellie Catholic RN Signed: 04/14/2021 4:44:59 PM By: Linton Ham MD Entered By: Linton Ham on 04/14/2021  15:25:33 -------------------------------------------------------------------------------- Multi-Disciplinary Care Plan Details Patient Name: Date of Service: Diane Ano. 04/14/2021 2:30 PM Medical Record Number: [id number] Patient Account Number: [id number] Date of Birth/Sex: Treating RN: 12-Oct-1935 (86 y.o. [name] Primary Care Aundreya Souffrant: Patrecia Pace Other Clinician: Referring Chelsa Stout: Treating Carl Bleecker/Extender: Donita Brooks in Treatment: 6 Multidisciplinary Care Plan reviewed with physician Active Inactive Venous Leg Ulcer Nursing Diagnoses: Potential for venous Insuffiency (use before diagnosis confirmed) Goals: Patient will maintain optimal edema control Date Initiated: 03/03/2021 Target Resolution Date: 05/01/2021 Goal Status: Active Interventions: Compression as ordered Provide education on venous insufficiency Treatment Activities: Therapeutic compression applied : 03/03/2021 Notes: Wound/Skin Impairment Nursing Diagnoses: Impaired tissue integrity Goals: Patient/caregiver will verbalize understanding of skin care regimen Date Initiated: 03/03/2021 Target Resolution Date: 05/01/2021 Goal Status: Active Ulcer/skin breakdown will have a volume reduction of 30% by week 4 Date Initiated: 03/03/2021 Date Inactivated: 03/31/2021 Target Resolution Date: 03/31/2021 Goal Status: Unmet Unmet Reason: calcifications Interventions: Assess patient/caregiver ability to obtain necessary supplies Assess patient/caregiver ability to perform ulcer/skin care regimen upon admission and as needed Assess ulceration(s) every visit Provide education on ulcer and skin care Treatment Activities: Topical wound management initiated : 03/03/2021 Notes: Electronic Signature(s) Signed: 04/14/2021 4:34:03 PM By: Dellie Catholic RN Entered By: Dellie Catholic on 04/14/2021  16:27:09 -------------------------------------------------------------------------------- Pain Assessment Details Patient Name: Date of Service: Diane Ano 04/14/2021 2:30 PM Medical Record Number: [id number] Patient Account Number: [id number] Date of Birth/Sex: Treating RN: 03/23/1935 (86 y.o. [name] Primary Care Hayze Gazda: Patrecia Pace Other Clinician: Referring Demonica Farrey: Treating Melynda Krzywicki/Extender: Donita Brooks in Treatment: 6 Active Problems Location of Pain Severity and Description of Pain Patient Has Paino No Site Locations Pain Management and Medication Current Pain Management: Electronic Signature(s) Signed: 04/14/2021 4:34:03 PM By: Dellie Catholic RN Signed: 04/15/2021 8:22:14 AM By: Sandre Kitty Entered By: Sandre Kitty on 04/14/2021 14:39:10 -------------------------------------------------------------------------------- Patient/Caregiver Education Details Patient Name: Date of Service: Diane Ano 2/21/2023andnbsp2:30 PM Medical Record Number: [id number] Patient Account Number: [id number] Date of Birth/Gender: Treating RN: 05/09/1935 (86 y.o. [name] Primary Care Physician: Patrecia Pace Other Clinician: Referring Physician: Treating Physician/Extender: Donita Brooks in Treatment: 6 Education Assessment Education Provided To: Patient Education Topics Provided Venous: Methods: Explain/Verbal Responses: Return demonstration correctly Electronic Signature(s) Signed: 04/14/2021 4:34:03 PM By: Dellie Catholic RN Entered By: Dellie Catholic on 04/14/2021 16:28:08 -------------------------------------------------------------------------------- Wound Assessment Details Patient Name: Date of Service: Diane Ano 04/14/2021 2:30 PM Medical Record Number: [id number] Patient Account Number: [id number] Date of Birth/Sex: Treating RN:  11-11-1935 (86 y.o. [name] Primary Care Tariya Morrissette: Patrecia Pace Other Clinician: Referring Jourdan Durbin: Treating Semaje Kinker/Extender: Donita Brooks in Treatment: 6 Wound Status Wound Number: 1 Primary Etiology: Infection - not elsewhere classified Wound Location: Left, Anterior Lower Leg Wound Status: Open Wounding Event: Surgical Injury Comorbid History: Cataracts, Hypertension, Osteoarthritis Date Acquired: 11/04/2020 Weeks Of Treatment: 6 Clustered Wound: No Photos Wound Measurements Length: (cm) 1 Width: (cm) 1.2 Depth: (cm) 0.3 Area: (cm) 0.942 Volume: (cm) 0.283 % Reduction in Area: 22.1% % Reduction in Volume: 22% Epithelialization: Small (1-33%) Wound Description Classification: Full Thickness With Exposed Support Structures Wound Margin: Well defined, not attached Exudate Amount: Medium Exudate Type: Serosanguineous Exudate Color: red, brown Foul Odor After Cleansing: No Slough/Fibrino Yes Wound Bed Granulation Amount: Large (67-100%) Exposed Structure Granulation Quality: Red, Pink Fascia Exposed: No Necrotic Amount: Small (1-33%) Fat Layer (Subcutaneous Tissue) Exposed: Yes Necrotic Quality: Adherent Slough Tendon Exposed: No Muscle Exposed: No Joint Exposed: No Bone Exposed: No Treatment Notes Wound #1 (Lower Leg) Wound Laterality: Left, Anterior Cleanser Soap and Water Discharge Instruction: May shower and wash wound with dial antibacterial soap and water prior to dressing change. Wound Cleanser Discharge Instruction: Cleanse the wound with wound cleanser prior to applying a clean dressing using gauze sponges, not tissue or cotton balls. Peri-Wound Care Triamcinolone 15 (g) Discharge Instruction: Use triamcinolone 15 (g) as directed Sween Lotion (Moisturizing lotion) Discharge Instruction: Apply moisturizing lotion as directed Topical Primary Dressing KerraCel Ag Gelling Fiber Dressing, 4x5 in (silver alginate) Discharge  Instruction: Apply silver alginate to wound bed as instructed Secondary Dressing Woven Gauze Sponge, Non-Sterile 4x4 in Discharge Instruction: Apply over primary dressing as directed. Bordered Gauze, 2x2 in Discharge Instruction: Apply over primary dressing as directed. Secured With Compression Wrap Compression Stockings Environmental education officer) Signed: 04/14/2021 4:34:03 PM By: Dellie Catholic RN Signed: 04/15/2021 8:22:14 AM By: Sandre Kitty Entered By: Sandre Kitty on 04/14/2021 14:46:06 -------------------------------------------------------------------------------- Wound Assessment Details Patient Name: Date of Service: Diane Ano. 04/14/2021 2:30 PM Medical Record Number: [id number] Patient Account Number: [id number] Date of Birth/Sex: Treating RN: 08/24/1935 (86 y.o. [name] Primary Care Donabelle Molden: Patrecia Pace Other Clinician: Referring [place]ah Vandergriff: Treating Ecko Beasley/Extender: Donita Brooks in Treatment: 6 Wound Status Wound Number: 2 Primary Etiology: Atypical Wound Location: Left, Proximal, Anterior Lower Leg Wound Status: Open Wounding Event: Gradually Appeared Comorbid History: Cataracts, Hypertension, Osteoarthritis Date Acquired: 03/10/2021 Weeks Of Treatment: 5 Clustered Wound: Yes Photos Wound Measurements Length: (cm) 0.7 Width: (cm) 1.1 Depth: (cm) 0.3 Clustered Quantity: 2 Area: (cm) 0.605 Volume: (cm) 0.181 % Reduction in Area: -60.5% % Reduction in Volume: 3.7% Epithelialization: Small (1-33%) Wound Description Classification: Full Thickness Without Exposed Support Structures Wound Margin: Well defined, not attached Exudate Amount: Medium Exudate Type: Serosanguineous Exudate Color: red, brown Foul Odor After Cleansing: No Slough/Fibrino Yes Wound Bed Granulation Amount: Large (67-100%) Exposed Structure Granulation Quality: Red Fascia Exposed: No Necrotic Amount: Small (1-33%) Fat  Layer (Subcutaneous Tissue) Exposed: Yes Necrotic Quality: Adherent Slough Tendon Exposed: No Muscle Exposed: No Joint Exposed: No Bone Exposed: No Treatment Notes Wound #2 (Lower Leg) Wound Laterality: Left, Anterior, Proximal Cleanser Soap and Water Discharge Instruction: May shower and wash wound with dial antibacterial soap and water prior to dressing change. Wound Cleanser Discharge Instruction: Cleanse the wound with wound cleanser prior to applying a clean dressing using gauze sponges, not tissue or cotton balls. Peri-Wound Care Triamcinolone 15 (g) Discharge Instruction: Use triamcinolone 15 (g) as directed Sween Lotion (Moisturizing  lotion) Discharge Instruction: Apply moisturizing lotion as directed Topical Primary Dressing KerraCel Ag Gelling Fiber Dressing, 4x5 in (silver alginate) Discharge Instruction: Apply silver alginate to wound bed as instructed Secondary Dressing Woven Gauze Sponge, Non-Sterile 4x4 in Discharge Instruction: Apply over primary dressing as directed. Bordered Gauze, 2x2 in Discharge Instruction: Apply over primary dressing as directed. Secured With Compression Wrap Compression Stockings Environmental education officer) Signed: 04/14/2021 4:34:03 PM By: Dellie Catholic RN Signed: 04/15/2021 8:22:14 AM By: Sandre Kitty Entered By: Sandre Kitty on 04/14/2021 14:45:15 -------------------------------------------------------------------------------- Vitals Details Patient Name: Date of Service: Diane Ano. 04/14/2021 2:30 PM Medical Record Number: [id number] Patient Account Number: [id number] Date of Birth/Sex: Treating RN: March 29, 1935 (86 y.o. [name] Primary Care Aashritha Miedema: Patrecia Pace Other Clinician: Referring Hazen Brumett: Treating Jennifer Holland/Extender: Donita Brooks in Treatment: 6 Vital Signs Time Taken: 14:37 Temperature (F): 98.3 Height (in): 60 Pulse (bpm): 62 Weight (lbs):  155 Respiratory Rate (breaths/min): 18 Body Mass Index (BMI): 30.3 Blood Pressure (mmHg): 178/87 Reference Range: 80 - 120 mg / dl Electronic Signature(s) Signed: 04/15/2021 8:22:14 AM By: Sandre Kitty Entered By: Sandre Kitty on 04/14/2021 14:38:38

## 2021-04-15 NOTE — Progress Notes (Signed)
PCP: Joya Gaskins, MD Cardiologist: denies  EKG: 04/15/21 CXR: na ECHO: denies Stress Test: denies Cardiac Cath: denies  Fasting Blood Sugar- na Checks Blood Sugar__na_ times a day  OSA/CPAP: No  ASA/Blood Thinner: No  Covid test not needed - ambulatory  Anesthesia Review: No  Patient denies shortness of breath, fever, cough, and chest pain at PAT appointment.  Patient verbalized understanding of instructions provided today at the PAT appointment.  Patient asked to review instructions at home and day of surgery.

## 2021-04-16 ENCOUNTER — Encounter: Payer: Self-pay | Admitting: Plastic Surgery

## 2021-04-16 ENCOUNTER — Ambulatory Visit (INDEPENDENT_AMBULATORY_CARE_PROVIDER_SITE_OTHER): Payer: Medicare HMO | Admitting: Plastic Surgery

## 2021-04-16 VITALS — BP 177/84 | HR 61 | Ht 60.0 in | Wt 149.0 lb

## 2021-04-16 DIAGNOSIS — T148XXA Other injury of unspecified body region, initial encounter: Secondary | ICD-10-CM

## 2021-04-16 MED ORDER — ONDANSETRON 4 MG PO TBDP: 4.0000 mg | ORAL_TABLET | Freq: Three times a day (TID) | ORAL | 0 refills | Status: DC | PRN

## 2021-04-16 MED ORDER — OXYCODONE HCL 5 MG PO TABS: 5.0000 mg | ORAL_TABLET | ORAL | 0 refills | Status: AC | PRN

## 2021-04-16 NOTE — H&P (View-Only) (Signed)
Patient ID: [name], female    DOB: [birth year], 86 y.o.   MRN: [id number]  Chief Complaint  Patient presents with   Pre-op Exam    No diagnosis found.   History of Present Illness: [name] is a 86 y.o.  female  with a history of left lower extremity ulcers.  She presents for preoperative evaluation for upcoming procedure, debridement with skin graft substitute, scheduled for 04/21/2021 with Dr.  Erin Hearing .  The patient has not had problems with anesthesia.     PMH Significant for: HTN   Past Medical History: Allergies: Allergies  Allergen Reactions   Ciprofloxacin Itching and Rash   Lovastatin Itching and Rash   Sulfamethoxazole Itching and Rash   Sulfonamide Derivatives Itching and Rash    Current Medications:  Current Outpatient Medications:    hydrALAZINE (APRESOLINE) 100 MG tablet, Take 50 mg by mouth 2 (two) times daily., Disp: , Rfl:    isosorbide mononitrate (IMDUR) 30 MG 24 hr tablet, Take 30 mg by mouth in the morning and at bedtime., Disp: , Rfl:    lisinopril (ZESTRIL) 20 MG tablet, Take 20 mg by mouth daily., Disp: , Rfl:    metoprolol tartrate (LOPRESSOR) 50 MG tablet, Take 50 mg by mouth 2 (two) times daily., Disp: , Rfl:    Multiple Vitamins-Minerals (MULTIVITAMIN WITH MINERALS) tablet, Take 1 tablet by mouth daily., Disp: , Rfl:    pravastatin (PRAVACHOL) 40 MG tablet, Take 40 mg by mouth at bedtime., Disp: , Rfl:    vitamin C (ASCORBIC ACID) 500 MG tablet, Take 500 mg by mouth daily., Disp: , Rfl:   Past Medical Problems: Past Medical History:  Diagnosis Date   Arthritis    Cancer ([place])    basal cell on face   Complication of anesthesia    History of kidney stones    Hypertension    PONV (postoperative nausea and vomiting)     Past Surgical History: Past Surgical History:  Procedure Laterality Date   EYE SURGERY     JOINT REPLACEMENT      Social History: Social History   Socioeconomic History   Marital status: Married     Spouse name: Not on file   Number of children: Not on file   Years of education: Not on file   Highest education level: Not on file  Occupational History   Not on file  Tobacco Use   Smoking status: Never   Smokeless tobacco: Never  Vaping Use   Vaping Use: Never used  Substance and Sexual Activity   Alcohol use: Never   Drug use: Never   Sexual activity: Not on file  Other Topics Concern   Not on file  Social History Narrative   Not on file   Social Determinants of Health   Financial Resource Strain: Not on file  Food Insecurity: Not on file  Transportation Needs: Not on file  Physical Activity: Not on file  Stress: Not on file  Social Connections: Not on file  Intimate Partner Violence: Not on file    Family History: No family history on file.  Review of Systems: ROS  Physical Exam: Vital Signs BP (!) 177/84 (BP Location: Right Arm, Patient Position: Sitting, Cuff Size: Normal)    Pulse 61    Ht 5' (1.524 m)    Wt 149 lb (67.6 kg)    SpO2 96%    BMI 29.10 kg/m   Physical Exam  Constitutional:  General: Not in acute distress.    Appearance: Normal appearance. Not ill-appearing.  HENT:     Head: Normocephalic and atraumatic.  Eyes:     Pupils: Pupils are equal, round Neck:     Musculoskeletal: Normal range of motion.  Cardiovascular:     Rate and Rhythm: Normal rate    Pulses: Normal pulses.  Pulmonary:     Effort: Pulmonary effort is normal. No respiratory distress.  Abdominal:     General: Abdomen is flat. There is no distension.  Musculoskeletal: Normal range of motion.  Skin:    General: Skin is warm and dry.     Findings: No erythema or rash.  Neurological:     General: No focal deficit present.     Mental Status: Alert and oriented to person, place, and time. Mental status is at baseline.     Motor: No weakness.  Psychiatric:        Mood and Affect: Mood normal.        Behavior: Behavior normal.  Extremity: Left lower extremity with  2 wounds superior wound is 2 x 0.7 cm inferior wound is 1.2 x 1.5 cm both show fat in the wound bed.    Assessment/Plan: The patient is scheduled for debridement and skin graft substitute with Dr.  Erin Hearing .  Risks, benefits, and alternatives of procedure discussed, questions answered and consent obtained.    Smoking Status: None;    Caprini Score: 5; Recommendation for mechanical prophylaxis. Encourage early ambulation.   Pictures obtained: previous visit  Post-op Rx sent to pharmacy: oxycodone,  zofran  Patient was provided with the  General Surgical Risk consent document and Pain Medication Agreement prior to their appointment.  They had adequate time to read through the risk consent documents and Pain Medication Agreement. We also discussed them in person together during this preop appointment. All of their questions were answered to their satisfaction.  Recommended calling if they have any further questions.  Risk consent form and Pain Medication Agreement to be scanned into patient's chart.     Electronically signed by: Lennice Sites, MD 04/16/2021 12:26 PM

## 2021-04-16 NOTE — Progress Notes (Signed)
Patient ID: [name], female    DOB: November 20, 1935, 86 y.o.   MRN: [id number]  Chief Complaint  Patient presents with   Pre-op Exam    No diagnosis found.   History of Present Illness: [name] is a 86 y.o.  female  with a history of left lower extremity ulcers.  She presents for preoperative evaluation for upcoming procedure, debridement with skin graft substitute, scheduled for 04/21/2021 with Dr.  Erin Hearing .  The patient has not had problems with anesthesia.     PMH Significant for: HTN   Past Medical History: Allergies: Allergies  Allergen Reactions   Ciprofloxacin Itching and Rash   Lovastatin Itching and Rash   Sulfamethoxazole Itching and Rash   Sulfonamide Derivatives Itching and Rash    Current Medications:  Current Outpatient Medications:    hydrALAZINE (APRESOLINE) 100 MG tablet, Take 50 mg by mouth 2 (two) times daily., Disp: , Rfl:    isosorbide mononitrate (IMDUR) 30 MG 24 hr tablet, Take 30 mg by mouth in the morning and at bedtime., Disp: , Rfl:    lisinopril (ZESTRIL) 20 MG tablet, Take 20 mg by mouth daily., Disp: , Rfl:    metoprolol tartrate (LOPRESSOR) 50 MG tablet, Take 50 mg by mouth 2 (two) times daily., Disp: , Rfl:    Multiple Vitamins-Minerals (MULTIVITAMIN WITH MINERALS) tablet, Take 1 tablet by mouth daily., Disp: , Rfl:    pravastatin (PRAVACHOL) 40 MG tablet, Take 40 mg by mouth at bedtime., Disp: , Rfl:    vitamin C (ASCORBIC ACID) 500 MG tablet, Take 500 mg by mouth daily., Disp: , Rfl:   Past Medical Problems: Past Medical History:  Diagnosis Date   Arthritis    Cancer ([place])    basal cell on face   Complication of anesthesia    History of kidney stones    Hypertension    PONV (postoperative nausea and vomiting)     Past Surgical History: Past Surgical History:  Procedure Laterality Date   EYE SURGERY     JOINT REPLACEMENT      Social History: Social History   Socioeconomic History   Marital status: Married     Spouse name: Not on file   Number of children: Not on file   Years of education: Not on file   Highest education level: Not on file  Occupational History   Not on file  Tobacco Use   Smoking status: Never   Smokeless tobacco: Never  Vaping Use   Vaping Use: Never used  Substance and Sexual Activity   Alcohol use: Never   Drug use: Never   Sexual activity: Not on file  Other Topics Concern   Not on file  Social History Narrative   Not on file   Social Determinants of Health   Financial Resource Strain: Not on file  Food Insecurity: Not on file  Transportation Needs: Not on file  Physical Activity: Not on file  Stress: Not on file  Social Connections: Not on file  Intimate Partner Violence: Not on file    Family History: No family history on file.  Review of Systems: ROS  Physical Exam: Vital Signs BP (!) 177/84 (BP Location: Right Arm, Patient Position: Sitting, Cuff Size: Normal)    Pulse 61    Ht 5' (1.524 m)    Wt 149 lb (67.6 kg)    SpO2 96%    BMI 29.10 kg/m   Physical Exam  Constitutional:  General: Not in acute distress.    Appearance: Normal appearance. Not ill-appearing.  HENT:     Head: Normocephalic and atraumatic.  Eyes:     Pupils: Pupils are equal, round Neck:     Musculoskeletal: Normal range of motion.  Cardiovascular:     Rate and Rhythm: Normal rate    Pulses: Normal pulses.  Pulmonary:     Effort: Pulmonary effort is normal. No respiratory distress.  Abdominal:     General: Abdomen is flat. There is no distension.  Musculoskeletal: Normal range of motion.  Skin:    General: Skin is warm and dry.     Findings: No erythema or rash.  Neurological:     General: No focal deficit present.     Mental Status: Alert and oriented to person, place, and time. Mental status is at baseline.     Motor: No weakness.  Psychiatric:        Mood and Affect: Mood normal.        Behavior: Behavior normal.  Extremity: Left lower extremity with  2 wounds superior wound is 2 x 0.7 cm inferior wound is 1.2 x 1.5 cm both show fat in the wound bed.    Assessment/Plan: The patient is scheduled for debridement and skin graft substitute with Dr.  Erin Hearing .  Risks, benefits, and alternatives of procedure discussed, questions answered and consent obtained.    Smoking Status: None;    Caprini Score: 5; Recommendation for mechanical prophylaxis. Encourage early ambulation.   Pictures obtained: previous visit  Post-op Rx sent to pharmacy: oxycodone,  zofran  Patient was provided with the  General Surgical Risk consent document and Pain Medication Agreement prior to their appointment.  They had adequate time to read through the risk consent documents and Pain Medication Agreement. We also discussed them in person together during this preop appointment. All of their questions were answered to their satisfaction.  Recommended calling if they have any further questions.  Risk consent form and Pain Medication Agreement to be scanned into patient's chart.     Electronically signed by: Lennice Sites, MD 04/16/2021 12:26 PM

## 2021-04-21 ENCOUNTER — Other Ambulatory Visit: Payer: Self-pay

## 2021-04-21 ENCOUNTER — Encounter (HOSPITAL_COMMUNITY)
Admission: RE | Disposition: A | Discharge status: Home / Self Care | Payer: Self-pay | Source: Home / Self Care | Attending: Plastic Surgery

## 2021-04-21 ENCOUNTER — Ambulatory Visit (HOSPITAL_COMMUNITY)
Admission: RE | Admit: 2021-04-21 | Discharge: 2021-04-21 | Disposition: A | Discharge status: Home / Self Care | Payer: Medicare HMO | Attending: Plastic Surgery | Admitting: Plastic Surgery

## 2021-04-21 ENCOUNTER — Encounter (HOSPITAL_COMMUNITY): Payer: Self-pay | Admitting: Plastic Surgery

## 2021-04-21 ENCOUNTER — Ambulatory Visit (HOSPITAL_BASED_OUTPATIENT_CLINIC_OR_DEPARTMENT_OTHER): Payer: Medicare HMO | Admitting: Certified Registered Nurse Anesthetist

## 2021-04-21 ENCOUNTER — Ambulatory Visit (HOSPITAL_COMMUNITY): Payer: Medicare HMO | Admitting: Certified Registered Nurse Anesthetist

## 2021-04-21 DIAGNOSIS — S81802A Unspecified open wound, left lower leg, initial encounter: Secondary | ICD-10-CM | POA: Insufficient documentation

## 2021-04-21 DIAGNOSIS — X58XXXA Exposure to other specified factors, initial encounter: Secondary | ICD-10-CM | POA: Diagnosis not present

## 2021-04-21 DIAGNOSIS — M1990 Unspecified osteoarthritis, unspecified site: Secondary | ICD-10-CM

## 2021-04-21 DIAGNOSIS — I10 Essential (primary) hypertension: Secondary | ICD-10-CM | POA: Insufficient documentation

## 2021-04-21 HISTORY — PX: DEBRIDEMENT AND CLOSURE WOUND: SHX5614

## 2021-04-21 SURGERY — DEBRIDEMENT, WOUND, WITH CLOSURE
Anesthesia: Monitor Anesthesia Care | Site: Leg Lower | Laterality: Left

## 2021-04-21 MED ORDER — SODIUM CHLORIDE 0.9% FLUSH: 3.0000 mL | Freq: Two times a day (BID) | INTRAVENOUS | Status: DC

## 2021-04-21 MED ORDER — ONDANSETRON HCL 4 MG/2ML IJ SOLN
INTRAMUSCULAR | Status: DC | PRN
  Administered 2021-04-21: 4 mg via INTRAVENOUS

## 2021-04-21 MED ORDER — PHENYLEPHRINE HCL-NACL 20-0.9 MG/250ML-% IV SOLN
INTRAVENOUS | Status: DC | PRN
  Administered 2021-04-21: 40 ug/min via INTRAVENOUS

## 2021-04-21 MED ORDER — ACETAMINOPHEN 10 MG/ML IV SOLN: 1000.0000 mg | Freq: Once | INTRAVENOUS | Status: DC | PRN

## 2021-04-21 MED ORDER — ACETAMINOPHEN 500 MG PO TABS: 1000.0000 mg | ORAL_TABLET | Freq: Once | ORAL | Status: DC | PRN

## 2021-04-21 MED ORDER — FENTANYL CITRATE (PF) 250 MCG/5ML IJ SOLN
INTRAMUSCULAR | Status: AC
  Filled 2021-04-21: qty 5

## 2021-04-21 MED ORDER — CHLORHEXIDINE GLUCONATE 0.12 % MT SOLN
OROMUCOSAL | Status: AC
  Administered 2021-04-21: 15 mL via OROMUCOSAL
  Filled 2021-04-21: qty 15

## 2021-04-21 MED ORDER — SODIUM CHLORIDE 0.9% FLUSH: 3.0000 mL | INTRAVENOUS | Status: DC | PRN

## 2021-04-21 MED ORDER — BUPIVACAINE-EPINEPHRINE (PF) 0.25% -1:200000 IJ SOLN
INTRAMUSCULAR | Status: AC
  Filled 2021-04-21: qty 30

## 2021-04-21 MED ORDER — BUPIVACAINE-EPINEPHRINE 0.25% -1:200000 IJ SOLN
INTRAMUSCULAR | Status: DC | PRN
  Administered 2021-04-21: 25 mL

## 2021-04-21 MED ORDER — ACETAMINOPHEN 650 MG RE SUPP: 650.0000 mg | RECTAL | Status: DC | PRN

## 2021-04-21 MED ORDER — PROPOFOL 500 MG/50ML IV EMUL
INTRAVENOUS | Status: DC | PRN
  Administered 2021-04-21: 75 ug/kg/min via INTRAVENOUS

## 2021-04-21 MED ORDER — ACETAMINOPHEN 160 MG/5ML PO SOLN: 1000.0000 mg | Freq: Once | ORAL | Status: DC | PRN

## 2021-04-21 MED ORDER — PROPOFOL 10 MG/ML IV BOLUS
INTRAVENOUS | Status: DC | PRN
  Administered 2021-04-21: 25 mg via INTRAVENOUS
  Administered 2021-04-21: 50 mg via INTRAVENOUS

## 2021-04-21 MED ORDER — ORAL CARE MOUTH RINSE: 15.0000 mL | Freq: Once | OROMUCOSAL | Status: AC

## 2021-04-21 MED ORDER — SODIUM CHLORIDE 0.9 % IV SOLN: 250.0000 mL | INTRAVENOUS | Status: DC | PRN

## 2021-04-21 MED ORDER — CEFAZOLIN SODIUM-DEXTROSE 2-4 GM/100ML-% IV SOLN
INTRAVENOUS | Status: AC
  Filled 2021-04-21: qty 100

## 2021-04-21 MED ORDER — 0.9 % SODIUM CHLORIDE (POUR BTL) OPTIME
TOPICAL | Status: DC | PRN
  Administered 2021-04-21: 1000 mL

## 2021-04-21 MED ORDER — LACTATED RINGERS IV SOLN: INTRAVENOUS | Status: DC

## 2021-04-21 MED ORDER — MINERAL OIL LIGHT 100 % EX OIL
TOPICAL_OIL | CUTANEOUS | Status: DC | PRN
  Administered 2021-04-21: 1 via TOPICAL

## 2021-04-21 MED ORDER — OXYCODONE HCL 5 MG PO TABS: 5.0000 mg | ORAL_TABLET | Freq: Once | ORAL | Status: DC | PRN

## 2021-04-21 MED ORDER — CHLORHEXIDINE GLUCONATE CLOTH 2 % EX PADS: 6.0000 | MEDICATED_PAD | Freq: Once | CUTANEOUS | Status: DC

## 2021-04-21 MED ORDER — FENTANYL CITRATE (PF) 250 MCG/5ML IJ SOLN
INTRAMUSCULAR | Status: DC | PRN
  Administered 2021-04-21 (×2): 50 ug via INTRAVENOUS

## 2021-04-21 MED ORDER — CHLORHEXIDINE GLUCONATE 0.12 % MT SOLN: 15.0000 mL | Freq: Once | OROMUCOSAL | Status: AC

## 2021-04-21 MED ORDER — ACETAMINOPHEN 325 MG PO TABS: 650.0000 mg | ORAL_TABLET | ORAL | Status: DC | PRN

## 2021-04-21 MED ORDER — FENTANYL CITRATE (PF) 100 MCG/2ML IJ SOLN: 25.0000 ug | INTRAMUSCULAR | Status: DC | PRN

## 2021-04-21 MED ORDER — OXYCODONE HCL 5 MG PO TABS: 5.0000 mg | ORAL_TABLET | ORAL | Status: DC | PRN

## 2021-04-21 MED ORDER — LIDOCAINE HCL 1 % IJ SOLN
INTRAMUSCULAR | Status: AC
  Filled 2021-04-21: qty 20

## 2021-04-21 MED ORDER — OXYCODONE HCL 5 MG/5ML PO SOLN: 5.0000 mg | Freq: Once | ORAL | Status: DC | PRN

## 2021-04-21 MED ORDER — SODIUM CHLORIDE 0.9 % IR SOLN
Status: DC | PRN
  Administered 2021-04-21: 3000 mL

## 2021-04-21 MED ORDER — CEFAZOLIN SODIUM-DEXTROSE 2-4 GM/100ML-% IV SOLN
2.0000 g | INTRAVENOUS | Status: AC
  Administered 2021-04-21: 2 g via INTRAVENOUS

## 2021-04-21 SURGICAL SUPPLY — 52 items
BLADE CLIPPER SURG (BLADE) IMPLANT
BLADE DERMATOME SS (BLADE) IMPLANT
BNDG ELASTIC 4X5.8 VLCR STR LF (GAUZE/BANDAGES/DRESSINGS) IMPLANT
BNDG ELASTIC 6X5.8 VLCR STR LF (GAUZE/BANDAGES/DRESSINGS) IMPLANT
BNDG GAUZE ELAST 4 BULKY (GAUZE/BANDAGES/DRESSINGS) IMPLANT
BRUSH SCRUB EZ PLAIN DRY (MISCELLANEOUS) IMPLANT
CANISTER SUCT 3000ML PPV (MISCELLANEOUS) IMPLANT
CANISTER WOUND CARE 500ML ATS (WOUND CARE) IMPLANT
COTTONBALL LRG STERILE PKG (GAUZE/BANDAGES/DRESSINGS) ×2 IMPLANT
COVER SURGICAL LIGHT HANDLE (MISCELLANEOUS) ×2 IMPLANT
DERMACARRIERS GRAFT 1 TO 1.5 (DISPOSABLE)
DRAPE EXTREMITY T 121X128X90 (DISPOSABLE) IMPLANT
DRAPE HALF SHEET 40X57 (DRAPES) IMPLANT
DRAPE INCISE IOBAN 66X45 STRL (DRAPES) IMPLANT
DRAPE ORTHO SPLIT 77X108 STRL (DRAPES)
DRAPE SURG ORHT 6 SPLT 77X108 (DRAPES) IMPLANT
DRESSING MEPILEX FLEX 4X4 (GAUZE/BANDAGES/DRESSINGS) IMPLANT
DRSG CALCIUM ALGINATE 4X4 (GAUZE/BANDAGES/DRESSINGS) IMPLANT
DRSG MEPILEX FLEX 4X4 (GAUZE/BANDAGES/DRESSINGS) ×2
DRSG MEPITEL 4X7.2 (GAUZE/BANDAGES/DRESSINGS) IMPLANT
DRSG OPSITE 6X11 MED (GAUZE/BANDAGES/DRESSINGS) IMPLANT
DRSG PAD ABDOMINAL 8X10 ST (GAUZE/BANDAGES/DRESSINGS) IMPLANT
DRSG VAC ATS LRG SENSATRAC (GAUZE/BANDAGES/DRESSINGS) IMPLANT
DRSG VAC ATS MED SENSATRAC (GAUZE/BANDAGES/DRESSINGS) IMPLANT
DRSG VAC ATS SM SENSATRAC (GAUZE/BANDAGES/DRESSINGS) IMPLANT
ELECT REM PT RETURN 9FT ADLT (ELECTROSURGICAL) ×2
ELECTRODE REM PT RTRN 9FT ADLT (ELECTROSURGICAL) ×1 IMPLANT
GAUZE SPONGE 4X4 12PLY STRL (GAUZE/BANDAGES/DRESSINGS) IMPLANT
GAUZE XEROFORM 5X9 LF (GAUZE/BANDAGES/DRESSINGS) IMPLANT
GLOVE SURG ENC TEXT LTX SZ7.5 (GLOVE) ×4 IMPLANT
GLOVE SURG UNDER POLY LF SZ7.5 (GLOVE) ×2 IMPLANT
GOWN STRL REUS W/ TWL LRG LVL3 (GOWN DISPOSABLE) ×2 IMPLANT
GOWN STRL REUS W/TWL LRG LVL3 (GOWN DISPOSABLE) ×4
GOWN STRL REUS W/TWL XL LVL3 (GOWN DISPOSABLE) ×2 IMPLANT
GRAFT DERMACARRIERS 1 TO 1.5 (DISPOSABLE) IMPLANT
HANDPIECE INTERPULSE COAX TIP (DISPOSABLE)
KIT BASIN OR (CUSTOM PROCEDURE TRAY) ×2 IMPLANT
KIT TURNOVER KIT B (KITS) ×2 IMPLANT
MANIFOLD NEPTUNE II (INSTRUMENTS) IMPLANT
NS IRRIG 1000ML POUR BTL (IV SOLUTION) ×2 IMPLANT
PACK GENERAL/GYN (CUSTOM PROCEDURE TRAY) ×2 IMPLANT
PAD ARMBOARD 7.5X6 YLW CONV (MISCELLANEOUS) ×4 IMPLANT
SET HNDPC FAN SPRY TIP SCT (DISPOSABLE) IMPLANT
STAPLER VISISTAT 35W (STAPLE) IMPLANT
SUT CHROMIC 4 0 PS 2 18 (SUTURE) IMPLANT
SUT CHROMIC 5 0 PS 3 (SUTURE) ×2 IMPLANT
SUT PDS AB 3-0 SH 27 (SUTURE) IMPLANT
SUT SILK 3 0 SH CR/8 (SUTURE) ×2 IMPLANT
SYR CONTROL 10ML LL (SYRINGE) ×2 IMPLANT
TOWEL GREEN STERILE (TOWEL DISPOSABLE) ×2 IMPLANT
TOWEL GREEN STERILE FF (TOWEL DISPOSABLE) ×2 IMPLANT
UNDERPAD 30X36 HEAVY ABSORB (UNDERPADS AND DIAPERS) ×2 IMPLANT

## 2021-04-21 NOTE — Transfer of Care (Signed)
Immediate Anesthesia Transfer of Care Note  Patient: [name]  Procedure(s) Performed: DEBRIDEMENT AND CLOSURE OF LEFT LOWER EXTREMITY  WOUND (Left: Leg Lower)  Patient Location: PACU  Anesthesia Type:MAC  Level of Consciousness: awake, alert  and oriented  Airway & Oxygen Therapy: Patient Spontanous Breathing  Post-op Assessment: Report given to RN and Post -op Vital signs reviewed and stable  Post vital signs: Reviewed and stable  Last Vitals:  Vitals Value Taken Time  BP    Temp    Pulse 72 04/21/21 1106  Resp 16 04/21/21 1106  SpO2 91 % 04/21/21 1106  Vitals shown include unvalidated device data.  Last Pain:  Vitals:   04/21/21 0827  PainSc: 0-No pain         Complications: No notable events documented.

## 2021-04-21 NOTE — Op Note (Signed)
Operative Note   DATE OF OPERATION: 04/21/2021  SURGICAL DEPARTMENT: Plastic Surgery  PREOPERATIVE DIAGNOSES:  left leg chronic wounds  POSTOPERATIVE DIAGNOSES:  same  PROCEDURE:   5 cm complex closure left leg wound  SURGEON: Dave Mannes P. Abegail Kloeppel, MD  ASSISTANT: none  ANESTHESIA:  General.   COMPLICATIONS: None.   INDICATIONS FOR PROCEDURE:  The patient, [name] is a 86 y.o. female born on January 22, 1936, is here for treatment of left leg wound MRN: [id number]  CONSENT:  Informed consent was obtained directly from the patient. Risks, benefits and alternatives were fully discussed. Specific risks including but not limited to bleeding, infection, hematoma, seroma, scarring, pain, contracture, asymmetry, wound healing problems, and need for further surgery were all discussed. The patient did have an ample opportunity to have questions answered to satisfaction.   DESCRIPTION OF PROCEDURE:  The patient was taken to the operating room. SCDs were placed and prop antibiotics were given. MAC anesthesia was administered.  The patient's operative site was prepped and draped in a sterile fashion. A time out was performed and all information was confirmed to be correct.    The left leg was injected with 0.25% Marcaine with epinephrine.  A 15 blade was used to make a skin incision and excise and ellipse that included both left lower extremity ulcers.  Bovie electrocautery was used for hemostasis.  The ellipse of tissue was sent to pathology.  The wound was widely undermined and closed with 2-0 PDS for dermis and 2-0 nylon for skin.  A dry sterile dressing was then applied.   The patient tolerated the procedure well.  There were no complications. The patient was allowed to wake from anesthesia, extubated and taken to the recovery room in satisfactory condition.

## 2021-04-21 NOTE — Interval H&P Note (Signed)
History and Physical Interval Note:  04/21/2021 10:02 AM  [name]  has presented today for surgery, with the diagnosis of Open Wound of Left leg.  The various methods of treatment have been discussed with the patient and family. After consideration of risks, benefits and other options for treatment, the patient has consented to  Procedure(s) with comments: DEBRIDEMENT AND CLOSURE OF LEFT LOWER EXTREMITY  WOUND (Left) - 45 minutes APPLICATION OF ACELL, MYRIAD OR INTEGRA (Left) SKIN GRAFT SPLIT THICKNESS (Left) as a surgical intervention.  The patient's history has been reviewed, patient examined, no change in status, stable for surgery.  I have reviewed the patient's chart and labs.  Questions were answered to the patient's satisfaction.     Lennice Sites

## 2021-04-21 NOTE — Discharge Instructions (Signed)
Leave left leg dressing on for 48 hours, then may remove dressing and shower.

## 2021-04-21 NOTE — Anesthesia Preprocedure Evaluation (Signed)
Anesthesia Evaluation  Patient identified by MRN, date of birth, ID band Patient awake    Reviewed: Allergy & Precautions, NPO status , Patient's Chart, lab work & pertinent test results  History of Anesthesia Complications (+) PONV and history of anesthetic complications  Airway Mallampati: III  TM Distance: >3 FB Neck ROM: Full    Dental  (+) Teeth Intact, Dental Advisory Given   Pulmonary neg pulmonary ROS,    breath sounds clear to auscultation       Cardiovascular hypertension, Pt. on medications (-) angina(-) Past MI and (-) CHF (-) dysrhythmias  Rhythm:Regular     Neuro/Psych  Headaches, neg Seizures negative psych ROS   GI/Hepatic negative GI ROS, Neg liver ROS,   Endo/Other  negative endocrine ROS  Renal/GU negative Renal ROSLab Results      Component                Value               Date                      CREATININE               1.0                 08/15/2009                Musculoskeletal  (+) Arthritis ,   Abdominal   Peds  Hematology negative hematology ROS (+) No results found for: WBC, HGB, HCT, MCV, PLT    Anesthesia Other Findings   Reproductive/Obstetrics                             Anesthesia Physical Anesthesia Plan  ASA: 2  Anesthesia Plan: MAC   Post-op Pain Management: Minimal or no pain anticipated   Induction:   PONV Risk Score and Plan: 3 and Propofol infusion and Ondansetron  Airway Management Planned: Nasal Cannula  Additional Equipment: None  Intra-op Plan:   Post-operative Plan:   Informed Consent: I have reviewed the patients History and Physical, chart, labs and discussed the procedure including the risks, benefits and alternatives for the proposed anesthesia with the patient or authorized representative who has indicated his/her understanding and acceptance.     Dental advisory given  Plan Discussed with: CRNA and  Anesthesiologist  Anesthesia Plan Comments:         Anesthesia Quick Evaluation

## 2021-04-22 ENCOUNTER — Telehealth: Payer: Self-pay

## 2021-04-22 ENCOUNTER — Encounter (HOSPITAL_COMMUNITY): Payer: Self-pay | Admitting: Plastic Surgery

## 2021-04-22 LAB — SURGICAL PATHOLOGY

## 2021-04-22 NOTE — Anesthesia Postprocedure Evaluation (Signed)
Anesthesia Post Note ? ?Patient: KECIA SWOBODA ? ?Procedure(s) Performed: DEBRIDEMENT AND CLOSURE OF LEFT LOWER EXTREMITY  WOUND (Left: Leg Lower) ? ?  ? ?Patient location during evaluation: PACU ?Anesthesia Type: MAC ?Level of consciousness: awake and alert ?Pain management: pain level controlled ?Vital Signs Assessment: post-procedure vital signs reviewed and stable ?Respiratory status: spontaneous breathing, nonlabored ventilation, respiratory function stable and patient connected to nasal cannula oxygen ?Cardiovascular status: stable and blood pressure returned to baseline ?Postop Assessment: no apparent nausea or vomiting ?Anesthetic complications: no ? ? ?No notable events documented. ? ?Last Vitals:  ?Vitals:  ? 04/21/21 1105 04/21/21 1120  ?BP: (!) 111/56 127/69  ?Pulse: 70 71  ?Resp: 16 17  ?Temp: (!) 36.2 ?C 36.6 ?C  ?SpO2: 92% 95%  ?  ?Last Pain:  ?Vitals:  ? 04/21/21 1120  ?PainSc: 0-No pain  ? ? ?  ?  ?  ?  ?  ?  ? ?Persephonie Hegwood ? ? ? ? ?

## 2021-04-22 NOTE — Telephone Encounter (Signed)
Patient's daughter, Zacarias Pontes, is calling for her mom who had surgery with Dr. Erin Hearing yesterday.  She said tomorrow will be 48 hours after her surgery and she can take the bandage off and shower.  She needs to know if they need to wrap it back up.  She said when her mom showers, is she supposed to take the whole bandage off?  She asked if she can get her leg wet.  Please call. ?

## 2021-04-23 ENCOUNTER — Other Ambulatory Visit: Payer: Self-pay

## 2021-04-23 ENCOUNTER — Telehealth: Payer: Self-pay

## 2021-04-23 ENCOUNTER — Ambulatory Visit: Payer: Medicare HMO | Admitting: Physician Assistant

## 2021-04-23 DIAGNOSIS — Z9889 Other specified postprocedural states: Secondary | ICD-10-CM

## 2021-04-23 MED ORDER — DOXYCYCLINE HYCLATE 100 MG PO TABS: 100.0000 mg | ORAL_TABLET | Freq: Two times a day (BID) | ORAL | 0 refills | Status: AC

## 2021-04-23 NOTE — Progress Notes (Addendum)
Patient is an 86 year old female with PMH of left lower extremity ulcers s/p debridement with closure performed 04/21/2021 by Dr. Erin Hearing who presents to clinic accompanied by her daughter for postoperative follow-up. ? ?Today, they expressed concerns for redness and swelling as well as pain and tightness.  She tells me that the tightness has been present ever since discharge from the PACU.  Denies any worsening in her pain symptoms.  She states that she has been ambulating with a walker to help relieve some of her discomfort.  She remove the dressing today to shower and noticed the redness and swelling which prompted her to come into the clinic for evaluation.  Denies any fevers or other systemic symptoms. ? ?Objectively on physical exam, no objective swelling of the left lower extremity relative to her contralateral leg.  Incision is well approximated, good eversion of skin edges.  Pedal pulse intact.  Sensation intact throughout.  There is peri-incisional erythema noted that is tender to palpation.  No fluctuance or crepitus noted.   ? ?I suspect that her redness is mechanical and related to her skin closure.  Encouraging her to elevate her left leg whenever possible to mitigate swelling.  However, given that this was closure of a chronic wound, discussed with Dr. Erin Hearing and will cover with doxycycline x10 days.  Doubt DVT.  They inquired about dressings, recommending Vaseline as needed.  She can continue to shower normally.  She will monitor the wound site closely and call the clinic should she develop any new or worsening symptoms. ? ?Picture(s) obtained of the patient and placed in the chart were with the patient's or guardian's permission. ?

## 2021-04-23 NOTE — Telephone Encounter (Signed)
Patient and daughter was seen in clinic today-04/23/21. ?

## 2021-04-23 NOTE — Telephone Encounter (Signed)
Patient's daughter, Zacarias Pontes, called back to say patient is getting ready to take a shower, so they took off her bandage.  She said it's really red around the wound, it's hot, and on one side it's swollen.  She said patient has been keeping it elevated.  Zacarias Pontes said they are getting ready to take a shower and they will just leave the bandage off until they hear from [place]. ? ?I spoke with Krista Blue, PA-C, and relayed the following message to [facility].  He said according to her post-op instructions, it's fine for the patient, after 48 hours, to remove the bandage and shower.  He suggested she replace it with a similar bandage.  He said if they are concerned with how it looks, he is available to see them today at 1pm, 2pm, or 2:30pm. ? ?Zacarias Pontes spoke with the patient and said she doesn't want to bother anyone.  She said she will come in if we think she needs to be seen.  I let her know that our clinical staff has not been in contact with Dr. Erin Hearing yet, but they can ask this question too when they speak with him.  I let her know that they will reach out to her after they speak with Dr. Erin Hearing. ?

## 2021-04-23 NOTE — Addendum Note (Signed)
Addended by: Krista Blue on: 04/23/2021 04:22 PM ? ? Modules accepted: Orders ? ?

## 2021-04-27 ENCOUNTER — Ambulatory Visit (INDEPENDENT_AMBULATORY_CARE_PROVIDER_SITE_OTHER): Payer: Medicare HMO | Admitting: Plastic Surgery

## 2021-04-27 ENCOUNTER — Other Ambulatory Visit: Payer: Self-pay

## 2021-04-27 ENCOUNTER — Encounter: Payer: Self-pay | Admitting: Plastic Surgery

## 2021-04-27 DIAGNOSIS — T148XXA Other injury of unspecified body region, initial encounter: Secondary | ICD-10-CM

## 2021-04-27 DIAGNOSIS — Z9889 Other specified postprocedural states: Secondary | ICD-10-CM

## 2021-04-27 NOTE — Progress Notes (Signed)
Patient is status post excision of left lower extremity chronic wounds.  Patient was started on doxycycline for some erythema.  She has noted GI distress over the weekend. ? ?Physical exam ?Incision clean dry and intact, less than 1 cm light erythema along suture line ? ?Assessment and plan ?I believe the erythema is most likely reactive.  Given that the patient is having GI distress with doxycycline we will stop antibiotics and restart if there is any progression of erythema or fevers. ?

## 2021-05-04 ENCOUNTER — Ambulatory Visit: Payer: Medicare HMO | Admitting: Plastic Surgery

## 2021-05-04 ENCOUNTER — Other Ambulatory Visit: Payer: Self-pay

## 2021-05-04 DIAGNOSIS — Z9889 Other specified postprocedural states: Secondary | ICD-10-CM

## 2021-05-04 NOTE — Progress Notes (Signed)
? ?  Referring Provider ?Joya Gaskins, [place] ?[address] ?[place],  [place] [address]  ? ?CC:  ?Left lower extremity closure ? ?QUINNLEY COLASURDO is an 86 y.o. female.  ?HPI:  ?86 year old status post closure of left lower extremity.  She notes more erythema when she is on her feet more. ? ?Review of Systems ?General: no fever, no chills ? ?Physical Exam ?General:  No acute distress,  Alert and oriented, Non-Toxic, Normal speech and affect ?Ext:  Mild erythema surrounding left leg wound ? ?Assessment/Plan ?Continue to monitor closely.  Will see the patient back in 2 weeks.  If there is any worsening then will consider restarting a different antibiotic. ? ?Lennice Sites ?05/04/2021, 12:19 PM  ? ? ?    ?

## 2021-05-14 ENCOUNTER — Ambulatory Visit
Admission: RE | Admit: 2021-05-14 | Discharge: 2021-05-14 | Disposition: A | Discharge status: Home / Self Care | Payer: Medicare HMO | Source: Ambulatory Visit | Attending: Family Medicine | Admitting: Family Medicine

## 2021-05-14 ENCOUNTER — Other Ambulatory Visit: Payer: Self-pay

## 2021-05-14 DIAGNOSIS — Z13820 Encounter for screening for osteoporosis: Secondary | ICD-10-CM

## 2021-05-18 ENCOUNTER — Ambulatory Visit: Payer: Medicare HMO | Admitting: Plastic Surgery

## 2021-05-18 ENCOUNTER — Other Ambulatory Visit: Payer: Self-pay

## 2021-05-18 DIAGNOSIS — T148XXA Other injury of unspecified body region, initial encounter: Secondary | ICD-10-CM | POA: Diagnosis not present

## 2021-05-18 NOTE — Progress Notes (Signed)
The patient is status post debridement of left lower extremity and closure of wound.  She is doing well.  No fevers or chills ? ?Physical exam ?Slight erythema surrounding wound, well-healed, sutures intact ? ?Assessment plan ?Sutures removed we will see the patient back as an as-needed basis. ?

## 2021-05-21 ENCOUNTER — Ambulatory Visit (HOSPITAL_BASED_OUTPATIENT_CLINIC_OR_DEPARTMENT_OTHER): Payer: Medicare HMO | Attending: Family Medicine | Admitting: Physical Therapy

## 2021-05-21 ENCOUNTER — Encounter (HOSPITAL_BASED_OUTPATIENT_CLINIC_OR_DEPARTMENT_OTHER): Payer: Self-pay | Admitting: Physical Therapy

## 2021-05-21 DIAGNOSIS — M25551 Pain in right hip: Secondary | ICD-10-CM | POA: Insufficient documentation

## 2021-05-21 DIAGNOSIS — M6281 Muscle weakness (generalized): Secondary | ICD-10-CM | POA: Diagnosis present

## 2021-05-21 NOTE — Therapy (Addendum)
?OUTPATIENT PHYSICAL THERAPY TREATMENT NOTE / RECERT ? ?Progress Note ?Reporting Period 03/16/2021 to 05/21/2021 ? ?See note below for Objective Data and Assessment of Progress/Goals.  ? ? ? ? ? ?Patient Name: [name] ?MRN: [id number] ?DOB:Aug 15, 1935, 86 y.o., female ?Today's Date: 05/21/2021 ? ?PCP: Joya Gaskins, FNP ? ? ? ? ?Past Medical History:  ?Diagnosis Date  ? Arthritis   ? Cancer [facility])   ? basal cell on face  ? Complication of anesthesia   ? History of kidney stones   ? Hypertension   ? PONV (postoperative nausea and vomiting)   ? ?Past Surgical History:  ?Procedure Laterality Date  ? DEBRIDEMENT AND CLOSURE WOUND Left 04/21/2021  ? Procedure: DEBRIDEMENT AND CLOSURE OF LEFT LOWER EXTREMITY  WOUND;  Surgeon: Lennice Sites, MD;  Location: [place];  Service: Plastics;  Laterality: Left;  45 minutes  ? EYE SURGERY    ? JOINT REPLACEMENT    ? ?Patient Active Problem List  ? Diagnosis Date Noted  ? Open wound of left lower leg 04/14/2021  ? Pain due to onychomycosis of toenails of both feet 04/10/2019  ? 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 HYPERGLYCEMIA, FASTING 08/22/2009  ? OSTEOARTHRITIS 06/13/2009  ? HEADACHE 06/13/2009  ? NEPHROLITHIASIS, HX OF 06/13/2009  ? HYPERLIPIDEMIA 06/11/2009  ? MIGRAINE HEADACHE 06/11/2009  ? HYPERTENSION 06/11/2009  ? ARTHRITIS 06/11/2009  ? SHOULDER PAIN, LEFT 06/11/2009  ? CHEST DISCOMFORT 06/11/2009  ? ? ?REFERRING PROVIDER: Joya Gaskins, * ?REFERRING DIAG: M54.31 (ICD-10-CM) - Sciatica, right side  ?  ?THERAPY DIAG:  ?Pain in right hip ?  ?Muscle weakness (generalized) ?  ?ONSET DATE: 02/02/2022 ?  ?SUBJECTIVE:                                                                                                                                                                                          ?  ?SUBJECTIVE STATEMENT: ?Pt underwent debridement and complex closure of left leg wound on 04/21/2021.  Pt states MD actually had to remove pieces of bone.  Pt was  seen on 05/18/2021 and note stated that she was doing well and will return to them on an as needed basis.  Pt states she is done with wound care and surgeon.  Pt was cleared by MD to return to PT. ? ?Pt states she doesn't always have pain.  Pt states she has an aching pain sometimes in the AM.  Pt reports the pain throws her balance off.  Pt states she uses a rollator if she walks at the park.  Pt ascends step with L LE to enter home. Pt denies radicular pain.  Pt is limited with applying wt thru R LE including performing stairs.  She steps first with L LE when performing stairs due to not wanting to apply increased weight thru R LE.   ? ?"I don't like walking. I'm lazy."  Pt states she does fear that she may trip over something and fall.  Pt reports having balance issues.  Pt denies R LE pain with ambulation.  Pt has improved performing household chores and able to perform without significant pain.  She can have some pain in R glute with standing which goes away with sitting down.   ?  ?  ?  ?PERTINENT HISTORY:  ?L LE wound, Bilat TKR, OA, CHF, prediabetes.  Pt reports having balance issues.  ?  ?PAIN:  ?Are you having pain? No at rest ?NPRS scale: 0/10 currently at rest, 5/10 worst pain which occasionally occurs 1st thing in AM. ?Pain location: R glute/post hip ?  ?  ?PRECAUTIONS: Other: L LE wound; fear of falling , uses a 4 wheeled walker for longer distance ?  ?WEIGHT BEARING RESTRICTIONS No ?  ?FALLS:  ?Has patient fallen in last 6 months? Yes, Number of falls: 1 ?  ?LIVING ENVIRONMENT: ?Lives with: lives with their family and lives alone ?Lives in: House/apartment ?Stairs: 1 step to enter home without rail ?Has following equipment at home: Gilford Rile - 4 wheeled, canes ?  ?OCCUPATION: Pt is retired ?  ?PLOF: Independent; Pt uses a 4 wheeled walker for walking longer distance.   ?  ?PATIENT GOALS walk better, reduce fear of falling ?  ?  ?OBJECTIVE:  ?  ?DIAGNOSTIC FINDINGS:  ?none ?  ?PATIENT SURVEYS:  ?FOTO 63  with a goal of 39.  Has improved from prior 57.  ?  ? ?OBSERVATION: ?         Wound in distal L LE is closed.  She does have some weakness              ?          ?        ?  ?PALPATION: ?No tenderness with palpation in lumbar paraspinals.  TTP proximal sacrum and R superior glute.  ?  ?  ?LE MMT: ?  ?MMT Right ?05/21/2021 Left ?05/21/2021  ?Hip flexion 5/5  5/5  ?Hip extension      ?Hip abduction <3/5  4+/5  ?Hip adduction      ?Hip internal rotation      ?Hip external rotation 4/5  5/5  ?Knee flexion 5/5    ?Knee extension 5/5  5/5  ?Ankle dorsiflexion      ?Ankle plantarflexion      ?Ankle inversion      ?Ankle eversion      ? (Blank rows = not tested) ? ?  ?LE MMT: ?  ?MMT Right ?03/17/2021 Left ?03/17/2021  ?Hip flexion      ?Hip extension      ?Hip abduction WFL    ?Hip adduction      ?Hip internal rotation      ?Hip external rotation      ?Knee flexion      ?Knee extension      ?Ankle dorsiflexion      ?Ankle plantarflexion      ?Ankle inversion      ?Ankle eversion      ? (Blank rows = not tested) ?  ?  ?LUMBAR SPECIAL TESTS:  ?Supine SLR Test:  R:  negative ?  ?  ?  ?  GAIT: ?Assistive device utilized: None ?Comments: No antalgic limp though does favor R LE having reduced stance time on R LE.  Pt leans to the R.  Pt does have some unsteadiness with gait.  Slow gait.   ?   PT educated pt with using SPC and pt states she could never learn using the cane.  She was taught in PT with using a cane after TKR.   ?  PT instructed pt and demonstrated using a SPC with gait including sequencing with appropriate LE.  Pt unable to use cane on L well but was able to use cane on R   ?  ?  ?TODAY'S TREATMENT  ?See education below. ?  ?  ?PATIENT EDUCATION:  ?Education details: Personnel officer with SPC, Dx, POC, relevant anatomy, prognosis, rationale for exercises.  PT answered Pt's questions. ?Person educated: Patient ?Education method: Explanation and visually with anatomy charts ?Education comprehension: verbalized understanding  and needs further education ?  ?  ?HOME EXERCISE PROGRAM: ?Will give next visit ?  ?  ?ASSESSMENT: ?  ?CLINICAL IMPRESSION: ?Pt has not been seen since the initial evaluation due to having surgery to debride and close wound on distal L LE.  Pt is doing much better overall having much reduced pain.  Pt is able to perform her normal ambulation without significant pain though doesn't walk a lot.  Pt is able to perform her normal household chores without significant pain.  She continues to have difficulty with stairs including difficulty with stepping up to enter home.  Pt continues to have a fear of falling and did have some unsteadiness with gait.  Pt was educated with using cane; pt has canes and a rollator at home.  Pt demonstrates improved R knee extension and hip flexion strength.  She had no change in R hip ER strength and was weaker in hip abd. Though she has not been to PT, she has met LTG's #3 and 5 and partially met STG #2 and LTG #4.  Pt should benefit from skilled PT to address impairments and goals and to improve overall function.   ?  ?Objective impairments include Abnormal gait, decreased activity tolerance, decreased endurance, decreased mobility, difficulty walking, decreased strength, and pain. These impairments are limiting patient from cleaning, community activity, and ambulation and stairs . Personal factors including 3+ comorbidities: L LE wound, Bilat TKR, OA, and CHF  are also affecting patient's functional outcome.  ? ?  ?REHAB POTENTIAL: Good ?  ?CLINICAL DECISION MAKING: Stable/uncomplicated ?  ?EVALUATION COMPLEXITY: Low ?  ?  ?GOALS: ?  ?  ?SHORT TERM GOALS: ?  ?STG Name Target Date Goal status  ?1 Pt will be independent and compliant with HEP for improved pain, strength, and function.  ?Baseline:  04/06/2021 INITIAL  ?2 Pt will demo improved stance time on R LE and reduced antalgic limp with gait.  ?Baseline:  04/06/2021 PARTIALLY MET  ?         ?         ?         ?         ?         ?   ?LONG TERM GOALS:  ?  ?LTG Name Target Date Goal status  ?1 Pt will be able to enter home with R LE ascending step without increased pain. ?Baseline: 06/25/2021 NOT MET  ?2 Pt will report improved confidence

## 2021-05-28 ENCOUNTER — Encounter (HOSPITAL_BASED_OUTPATIENT_CLINIC_OR_DEPARTMENT_OTHER): Payer: Medicare HMO | Admitting: Physical Therapy

## 2021-06-02 ENCOUNTER — Ambulatory Visit (HOSPITAL_BASED_OUTPATIENT_CLINIC_OR_DEPARTMENT_OTHER): Payer: Medicare HMO | Attending: Family Medicine | Admitting: Physical Therapy

## 2021-06-02 DIAGNOSIS — M25551 Pain in right hip: Secondary | ICD-10-CM | POA: Insufficient documentation

## 2021-06-02 DIAGNOSIS — M6281 Muscle weakness (generalized): Secondary | ICD-10-CM | POA: Insufficient documentation

## 2021-06-02 NOTE — Therapy (Signed)
?OUTPATIENT PHYSICAL THERAPY TREATMENT NOTE   ? ? ? ? ? ?Patient Name: [name] ?MRN: [id number] ?DOB:[birth year], 86 y.o., female ?Today's Date: 06/03/2021 ? ?PCP: Joya Gaskins, FNP ? ? ? PT End of Session - 06/03/21 0830   ? ? Visit Number 3   ? Number of Visits 10   ? Date for PT Re-Evaluation 06/25/21   ? Authorization Type [employer]   ? PT Start Time 1350   ? PT Stop Time 1428   ? PT Time Calculation (min) 38 min   ? Activity Tolerance Patient tolerated treatment well   ? Behavior During Therapy [facility] for tasks assessed/performed   ? ?  ?  ? ?  ? ? ?Past Medical History:  ?Diagnosis Date  ? Arthritis   ? Cancer [facility])   ? basal cell on face  ? Complication of anesthesia   ? History of kidney stones   ? Hypertension   ? PONV (postoperative nausea and vomiting)   ? ?Past Surgical History:  ?Procedure Laterality Date  ? DEBRIDEMENT AND CLOSURE WOUND Left 04/21/2021  ? Procedure: DEBRIDEMENT AND CLOSURE OF LEFT LOWER EXTREMITY  WOUND;  Surgeon: Lennice Sites, MD;  Location: [place];  Service: Plastics;  Laterality: Left;  45 minutes  ? EYE SURGERY    ? JOINT REPLACEMENT    ? ?Patient Active Problem List  ? Diagnosis Date Noted  ? Open wound of left lower leg 04/14/2021  ? Pain due to onychomycosis of toenails of both feet 04/10/2019  ? 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 HYPERGLYCEMIA, FASTING 08/22/2009  ? OSTEOARTHRITIS 06/13/2009  ? HEADACHE 06/13/2009  ? NEPHROLITHIASIS, HX OF 06/13/2009  ? HYPERLIPIDEMIA 06/11/2009  ? MIGRAINE HEADACHE 06/11/2009  ? HYPERTENSION 06/11/2009  ? ARTHRITIS 06/11/2009  ? SHOULDER PAIN, LEFT 06/11/2009  ? CHEST DISCOMFORT 06/11/2009  ? ? ?REFERRING PROVIDER: Joya Gaskins, * ?REFERRING DIAG: M54.31 (ICD-10-CM) - Sciatica, right side  ?  ?THERAPY DIAG:  ?Pain in right hip ?  ?Muscle weakness (generalized) ?  ?ONSET DATE: 02/02/2022 ?  ?SUBJECTIVE:                                                                                                                                                                                           ?  ?SUBJECTIVE STATEMENT: ?Pt underwent debridement and complex closure of left leg wound on 04/21/2021.  Pt states MD actually had to remove pieces of bone.  Pt was seen on 05/18/2021 and note stated that she was doing well and will return to them on an as needed basis.  Pt states she is done with wound care  and Psychologist, sport and exercise.  Pt was cleared by MD to return to PT. ? ?Pt reports the pain throws her balance off.  Pt states she uses a rollator if she walks at the park.  Pt ascends step with L LE to enter home. Pt denies radicular pain.  Pt is limited with applying wt thru R LE including performing stairs.  She steps first with L LE when performing stairs due to not wanting to apply increased weight thru R LE.   ?Pt denies R LE pain with ambulation.  Pt has improved performing household chores and able to perform without significant pain.  Pt is able to move furniture and vacuum.   ? ?Pt reports she became dizzy approx 2 weeks ago.  She has had vertigo in the past though states the ENT informed her it was not "the crystals this time".  Pt states ENT prescribed a medication but it's not helping.  Pt has pain in R glute/posterior hip when lying down.  Pt denies pain currently and denies any adverse effects after prior Rx.  ?  ?  ?  ?PERTINENT HISTORY:  ?L LE wound, Bilat TKR, OA, CHF, prediabetes.  Pt reports having balance issues.  ?  ?PAIN:  ?Are you having pain? No at rest ?NPRS scale: 0/10 currently at rest, 5/10 worst pain which occasionally occurs 1st thing in AM. ?Pain location: R glute/post hip ?  ?  ?PRECAUTIONS: Other: L LE wound; fear of falling , uses a 4 wheeled walker for longer distance ?  ?WEIGHT BEARING RESTRICTIONS No ?  ?FALLS:  ?Has patient fallen in last 6 months? Yes, Number of falls: 1 ?  ?LIVING ENVIRONMENT: ?Lives with: lives with their family and lives alone ?Lives in: House/apartment ?Stairs: 1 step to enter home without rail ?Has following  equipment at home: [employer] Rile - 4 wheeled, canes ?  ?OCCUPATION: Pt is retired ?  ?PLOF: Independent; Pt uses a 4 wheeled walker for walking longer distance.   ?  ?PATIENT GOALS walk better, reduce fear of falling ?  ?  ?OBJECTIVE:  ?  ?DIAGNOSTIC FINDINGS:  ?none ? ?OBSERVATION: ?         Wound in distal L LE is closed.                  ?        ?  ? ?TODAY'S TREATMENT  ?-Reviewed current function, pain level, and response to prior Rx.   ?-Assessed piriformis stretch from MD.  Pt demonstrated seated piriformis stretch and reports pain.  ?Pt performed: ? Supine clams with RTB x 10 reps and with GTB 2x10 reps ?Standing hip abd with R LE 3x10 reps.  Pt had pain in R hip with performing L hip abd.  PT stopped L hip abd ?Marching on airex 2x10 with bilat Ues ?Step up on airex with 1 UE assist 2x10 reps ?Step ups on 4 inch step with 1 UE assist 2x10 reps ?Supine SLR approx 10 reps and 5 reps on R LE and 2x10 on L LE. ?Seated clams with GTB ? ?-Established HEP and gave pt a HEP handout.  Educated pt in correct form and appropriate frequency.  ? ?-See education below. ?  ?  ?PATIENT EDUCATION:  ?Education details:  Adjusted height of FWW and educated pt in proper height.  Pt received a HEP handout and was educated in correct form and appropriate frequency.  Pt instructed to hold on to a sturdy support when performing standing hip abduction  and just perform with R LE.  POC, relevant anatomy, rationale for exercises.  PT answered Pt's questions. ?Person educated: Patient ?Education method: Explanation, demonstration, verbal and tactile cues, handout ?Education comprehension: verbalized understanding, returned demonstration, verbal and tactile cues required, and needs further education ?  ?  ?HOME EXERCISE PROGRAM: ?Access Code: [email or web address] ?URL: [email or web address] ?Date: 06/02/2021 ?Prepared by: Ronny Flurry ? ?Exercises ?- Standing Hip Abduction with Counter Support  - 1 x daily - 5 x weekly - 2 sets - 10 reps ?-  Seated Hip Abduction with Resistance  - 1 x daily - 3-4 x weekly - 2 sets - 10 reps ?  ?  ?ASSESSMENT: ?  ?CLINICAL IMPRESSION: ?Pt presents to Rx with a walker due to having dizziness.  Pt's walker height was too high.  PT adjusted walker height to appropriate height and educated pt with appropriate height.  Pt reports feeling much better with walker lower.  Pt had dizziness with lying supine and/or getting up from supine.  PT had pt rest seated at edge of table after supine exercises to decrease her dizziness.  PT did not include supine exercises for HEP due to her dizziness.  PT gave her a HEP and instructed her in correct form and appropriate frequency.  Pt demonstrates good understanding of HEP.  Exercises were limited in supine due to dizziness.  PT reviewed stretch from MD and pt c/o'd of pain when performing seated piriformis stretch.  Pt states she hasn't been performing that stretch and PT instructed pt to not perform.  Pt responded well to Rx having no increased pain after Rx.  ?  ?  ?Objective impairments include Abnormal gait, decreased activity tolerance, decreased endurance, decreased mobility, difficulty walking, decreased strength, and pain. These impairments are limiting patient from cleaning, community activity, and ambulation and stairs . Personal factors including 3+ comorbidities: L LE wound, Bilat TKR, OA, and CHF  are also affecting patient's functional outcome.  ? ?  ?REHAB POTENTIAL: Good ?  ?CLINICAL DECISION MAKING: Stable/uncomplicated ?  ?EVALUATION COMPLEXITY: Low ?  ?  ?GOALS: ?  ?  ?SHORT TERM GOALS: ?  ?STG Name Target Date Goal status  ?1 Pt will be independent and compliant with HEP for improved pain, strength, and function.  ?Baseline:  04/06/2021 INITIAL  ?2 Pt will demo improved stance time on R LE and reduced antalgic limp with gait.  ?Baseline:  04/06/2021 PARTIALLY MET  ?         ?         ?         ?         ?         ?  ?LONG TERM GOALS:  ?  ?LTG Name Target Date Goal status   ?1 Pt will be able to enter home with R LE ascending step without increased pain. ?Baseline: 06/25/2021 NOT MET  ?2 Pt will report improved confidence with ambulation including gait stability. ?[employer]

## 2021-06-03 ENCOUNTER — Encounter (HOSPITAL_BASED_OUTPATIENT_CLINIC_OR_DEPARTMENT_OTHER): Payer: Self-pay | Admitting: Physical Therapy

## 2021-06-10 ENCOUNTER — Encounter (HOSPITAL_BASED_OUTPATIENT_CLINIC_OR_DEPARTMENT_OTHER): Payer: Self-pay | Admitting: Physical Therapy

## 2021-06-10 ENCOUNTER — Ambulatory Visit (HOSPITAL_BASED_OUTPATIENT_CLINIC_OR_DEPARTMENT_OTHER): Payer: Medicare HMO | Admitting: Physical Therapy

## 2021-06-10 DIAGNOSIS — M6281 Muscle weakness (generalized): Secondary | ICD-10-CM

## 2021-06-10 DIAGNOSIS — M25551 Pain in right hip: Secondary | ICD-10-CM

## 2021-06-10 NOTE — Therapy (Signed)
?OUTPATIENT PHYSICAL THERAPY TREATMENT NOTE   ? ? ? ? ? ?Patient Name: [name] ?MRN: [id number] ?DOB:[birth year], 86 y.o., female ?Today's Date: 06/10/2021 ? ?PCP: Joya Gaskins, FNP ? ? ? PT End of Session - 06/10/21 1404   ? ? Visit Number 4   ? Number of Visits 10   ? Date for PT Re-Evaluation 06/25/21   ? Authorization Type [employer]   ? PT Start Time 8099   ? PT Stop Time 8338   ? PT Time Calculation (min) 39 min   ? Activity Tolerance Patient tolerated treatment well   ? Behavior During Therapy [facility] for tasks assessed/performed   ? ?  ?  ? ?  ? ? ? ?Past Medical History:  ?Diagnosis Date  ? Arthritis   ? Cancer [facility])   ? basal cell on face  ? Complication of anesthesia   ? History of kidney stones   ? Hypertension   ? PONV (postoperative nausea and vomiting)   ? ?Past Surgical History:  ?Procedure Laterality Date  ? DEBRIDEMENT AND CLOSURE WOUND Left 04/21/2021  ? Procedure: DEBRIDEMENT AND CLOSURE OF LEFT LOWER EXTREMITY  WOUND;  Surgeon: Lennice Sites, MD;  Location: [facility];  Service: Plastics;  Laterality: Left;  45 minutes  ? EYE SURGERY    ? JOINT REPLACEMENT    ? ?Patient Active Problem List  ? Diagnosis Date Noted  ? Open wound of left lower leg 04/14/2021  ? Pain due to onychomycosis of toenails of both feet 04/10/2019  ? 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 HYPERGLYCEMIA, FASTING 08/22/2009  ? OSTEOARTHRITIS 06/13/2009  ? HEADACHE 06/13/2009  ? NEPHROLITHIASIS, HX OF 06/13/2009  ? HYPERLIPIDEMIA 06/11/2009  ? MIGRAINE HEADACHE 06/11/2009  ? HYPERTENSION 06/11/2009  ? ARTHRITIS 06/11/2009  ? SHOULDER PAIN, LEFT 06/11/2009  ? CHEST DISCOMFORT 06/11/2009  ? ? ?REFERRING PROVIDER: Joya Gaskins, * ?REFERRING DIAG: M54.31 (ICD-10-CM) - Sciatica, right side  ?  ?THERAPY DIAG:  ?Pain in right hip ?  ?Muscle weakness (generalized) ?  ?ONSET DATE: 02/02/2022 ?  ?SUBJECTIVE:                                                                                                                                                                                           ?  ?SUBJECTIVE STATEMENT: ?-Pt is limited with applying wt thru R LE including performing stairs.  She steps first with L LE when performing stairs due to not wanting to apply increased weight thru R LE.   ?-She has had vertigo in the past though states the ENT informed her it was not "the crystals this time".  Pt requested to not perform any exercises lying down due to dizziness.  Pt cont to c/o of having dizziness.  She has spoken to MD and is changing the dosage of her lisinopril and keeping track of her blood pressure.  She is to return to MD in 2 weeks and may have blood work done at that time.  Pt states her BP was 98/78 this AM.   ?-Pt states she had increased pain for 3 days after prior Rx.  Pt states her pain was a 6/10 after prior Rx.  Pt hasn't done her HEP much though hasn't had increased pain with HEP when she does it.  Pt denies pain currently. ?  ?  ?  ?PERTINENT HISTORY:  ?L LE wound, Bilat TKR, OA, CHF, prediabetes.  Pt reports having balance issues.  ?  ?PAIN:  ?Are you having pain? No at rest ?NPRS scale: 0/10 currently at rest ?Pain location: R glute/post hip ?  ?  ?PRECAUTIONS: Other: L LE wound; fear of falling , uses a 4 wheeled walker for longer distance ?  ?WEIGHT BEARING RESTRICTIONS No ?  ?FALLS:  ?Has patient fallen in last 6 months? Yes, Number of falls: 1 ?  ?LIVING ENVIRONMENT: ?Lives with: lives with their family and lives alone ?Lives in: House/apartment ?Stairs: 1 step to enter home without rail ?Has following equipment at home: [employer] Rile - 4 wheeled, canes ?  ?OCCUPATION: Pt is retired ?  ?PLOF: Independent; Pt uses a 4 wheeled walker for walking longer distance.   ?  ?PATIENT GOALS walk better, reduce fear of falling ?  ?  ?OBJECTIVE:  ?  ?DIAGNOSTIC FINDINGS:  ?none ? ?TODAY'S TREATMENT  ? ?OBSERVATION: ?         Pt had some redness below her wound.  She states that's normal for her leg and her wound is doing very well.   She is pleased.  Her wound in distal L LE is closed.         ? ?VITALS: ?BP:  145/86 ?HR:  78          ?        ?  ?-Reviewed pt presentation, HEP compliance, pain level, and response to prior Rx.   ?-Reviewed HEP ?-Pt performed: ? Sit to stands 2x10 reps from high/low table ?Standing hip abd with R LE 3x10 reps.  Pt had pain in R hip with performing L hip abd.  PT stopped L hip abd ?Marching on airex 2x15 with bilat Ues ?Step up on airex with 1 UE assist 2x10 reps ?Step ups on 4 inch step without UE assist x10 reps ?Seated clams with GTB 3x10 reps ?Shuttle leg press x20 reps with 1-25 and 1-12. ? ?-See education below. ?  ?  ?PATIENT EDUCATION:  ?Education details:  POC, HEP, relevant anatomy, and rationale for exercises.  PT answered Pt's questions. ?Person educated: Patient ?Education method: Explanation, demonstration, verbal and tactile cues, handout ?Education comprehension: verbalized understanding, returned demonstration, verbal and tactile cues required, and needs further education ?  ?  ?HOME EXERCISE PROGRAM: ?Access Code: [email or web address] ?URL: [email or web address] ?Date: 06/02/2021 ?Prepared by: Ronny Flurry ? ?Exercises ?- Standing Hip Abduction with Counter Support  - 1 x daily - 5 x weekly - 2 sets - 10 reps ?- Seated Hip Abduction with Resistance  - 1 x daily - 3-4 x weekly - 2 sets - 10 reps ?  ?  ?ASSESSMENT: ?  ?CLINICAL IMPRESSION: ? Pt continues to have issues with dizziness  and requested to not perform any supine exercises.  Pt is seeing MD for dizziness and monitoring her BP.  PT did not perform supine exercises due to pt request.  Pt reports having low BP this AM though had improved BP and HR in the clinic.  Pt is ambulating with FWW and has good stability with FWW.  Pt performed exercises well without c/o's except having R hip pain with attempting L hip abd.  Pt was challenged though was able to perform step ups  on a 4 inch step without UE assist.  Pt responded well to Rx having no  increased pain after Rx.  Pt should benefit from cont skilled PT services to address ongoing goals and to improve overall function.   ?  ?  ?Objective impairments include Abnormal gait, decreased activity tolerance, decreased endurance, decreased mobility, difficulty walking, decreased strength, and pain. These impairments are limiting patient from cleaning, community activity, and ambulation and stairs . Personal factors including 3+ comorbidities: L LE wound, Bilat TKR, OA, and CHF  are also affecting patient's functional outcome.  ? ?  ?REHAB POTENTIAL: Good ?  ?CLINICAL DECISION MAKING: Stable/uncomplicated ?  ?EVALUATION COMPLEXITY: Low ?  ?  ?GOALS: ?  ?  ?SHORT TERM GOALS: ?  ?STG Name Target Date Goal status  ?1 Pt will be independent and compliant with HEP for improved pain, strength, and function.  ?Baseline:  04/06/2021 INITIAL  ?2 Pt will demo improved stance time on R LE and reduced antalgic limp with gait.  ?Baseline:  04/06/2021 PARTIALLY MET  ?         ?         ?         ?         ?         ?  ?LONG TERM GOALS:  ?  ?LTG Name Target Date Goal status  ?1 Pt will be able to enter home with R LE ascending step without increased pain. ?Baseline: 06/25/2021 NOT MET  ?2 Pt will report improved confidence with ambulation including gait stability. ?Baseline: 06/25/2021 NOT MET  ?3 Pt will report improved tolerance with standing activities including performing household chores without significant pain or difficulty.  ?Baseline: 04/27/2021 GOAL MET  ?4 Pt will demo improved R LE strength to 5/5 in hip flex and ER and knee ext and 4+/5 in hip abd for improved tolerance with and performance of functional mobility skills.  ?Baseline: 06/25/2021 33% met  ?5 Pt will report she is able to perform her normal ambulation without significant R LE pain.  ?Baseline: 04/28/2021 GOAL MET  ?PLAN: ?PT FREQUENCY: 1-2x/week ?  ?PT DURATION: 5 weeks ?  ?PLANNED INTERVENTIONS: Therapeutic exercises, Therapeutic activity, Neuro Muscular  re-education, Balance training, Gait training, Patient/Family education, Joint mobilization, Stair training, DME instructions, Electrical stimulation, Spinal mobilization, Cryotherapy, Moist heat, Taping

## 2021-06-17 ENCOUNTER — Ambulatory Visit (HOSPITAL_BASED_OUTPATIENT_CLINIC_OR_DEPARTMENT_OTHER): Payer: Medicare HMO | Admitting: Physical Therapy

## 2021-06-25 ENCOUNTER — Ambulatory Visit (HOSPITAL_BASED_OUTPATIENT_CLINIC_OR_DEPARTMENT_OTHER): Payer: Medicare HMO | Attending: Family Medicine | Admitting: Physical Therapy

## 2021-06-25 DIAGNOSIS — M6281 Muscle weakness (generalized): Secondary | ICD-10-CM | POA: Diagnosis present

## 2021-06-25 DIAGNOSIS — M25551 Pain in right hip: Secondary | ICD-10-CM | POA: Diagnosis present

## 2021-06-25 NOTE — Therapy (Signed)
?OUTPATIENT PHYSICAL THERAPY TREATMENT NOTE   ? ? ? ? ? ?Patient Name: [name] ?MRN: [id number] ?DOB:02/15/1936, 86 y.o., female ?Today's Date: 06/26/2021 ? ?PCP: Morrison, Hayden Byrd, FNP ? ? ? PT End of Session - 06/25/21 1405   ? ? Visit Number 5   ? Number of Visits 10   ? Date for PT Re-Evaluation 06/25/21   ? Authorization Type [employer]   ? PT Start Time 1355   ? PT Stop Time 1430   ? PT Time Calculation (min) 35 min   ? Activity Tolerance Patient tolerated treatment well   ? Behavior During Therapy WFL for tasks assessed/performed   ? ?  ?  ? ?  ? ? ? ?Past Medical History:  ?Diagnosis Date  ? Arthritis   ? Cancer ([facility])   ? basal cell on face  ? Complication of anesthesia   ? History of kidney stones   ? Hypertension   ? PONV (postoperative nausea and vomiting)   ? ?Past Surgical History:  ?Procedure Laterality Date  ? DEBRIDEMENT AND CLOSURE WOUND Left 04/21/2021  ? Procedure: DEBRIDEMENT AND CLOSURE OF LEFT LOWER EXTREMITY  WOUND;  Surgeon: Luppens, Daniel, MD;  Location: [facility];  Service: Plastics;  Laterality: Left;  45 minutes  ? EYE SURGERY    ? JOINT REPLACEMENT    ? ?Patient Active Problem List  ? Diagnosis Date Noted  ? Open wound of left lower leg 04/14/2021  ? Pain due to onychomycosis of toenails of both feet 04/10/2019  ? 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 HYPERGLYCEMIA, FASTING 08/22/2009  ? OSTEOARTHRITIS 06/13/2009  ? HEADACHE 06/13/2009  ? NEPHROLITHIASIS, HX OF 06/13/2009  ? HYPERLIPIDEMIA 06/11/2009  ? MIGRAINE HEADACHE 06/11/2009  ? HYPERTENSION 06/11/2009  ? ARTHRITIS 06/11/2009  ? SHOULDER PAIN, LEFT 06/11/2009  ? CHEST DISCOMFORT 06/11/2009  ? ? ?REFERRING PROVIDER: Morrison, Hayden Byrd, * ?REFERRING DIAG: M54.31 (ICD-10-CM) - Sciatica, right side  ?  ?THERAPY DIAG:  ?Pain in right hip ?  ?Muscle weakness (generalized) ?  ?ONSET DATE: 02/02/2022 ?  ?SUBJECTIVE:                                                                                                                                                                                           ?  ?SUBJECTIVE STATEMENT: ?-Pt states she had increased pain for 3 days after prior Rx.  Pt states her pain was a 6/10 after prior Rx.  Pt hasn't done her HEP much though hasn't had increased pain with HEP when she does it.  Pt denies pain currently. ? ? ?Pt states her MD decreased her lisinopril   and her dizziness has improved.  Pt reports her leg pain has improved.  Pt states she only has pain when she lies on L LE.  Pt states she has not been having the pain otherwise.  Pt reports she is stronger as she is able to step into her house better.  Pt states she has done a lot of work outside including organizing her shed and gardening. ?  ?  ?  ?PERTINENT HISTORY:  ?L LE wound, Bilat TKR, OA, CHF, prediabetes.  Pt reports having balance issues.  ?  ?PAIN:  ?Are you having pain? No at rest ?NPRS scale: 0/10 currently at rest ?Pain location: R glute/post hip ?  ?  ?PRECAUTIONS: Other: L LE wound; fear of falling , uses a 4 wheeled walker for longer distance ?  ?WEIGHT BEARING RESTRICTIONS No ?  ?FALLS:  ?Has patient fallen in last 6 months? Yes, Number of falls: 1 ?  ?LIVING ENVIRONMENT: ?Lives with: lives with their family and lives alone ?Lives in: House/apartment ?Stairs: 1 step to enter home without rail ?Has following equipment at home: Gil[employer] Rile - 4 wheeled, canes ?  ?OCCUPATION: Pt is retired ?  ?PLOF: Independent; Pt uses a 4 wheeled walker for walking longer distance.   ?  ?PATIENT GOALS walk better, reduce fear of falling ?  ?  ?OBJECTIVE:  ?  ?DIAGNOSTIC FINDINGS:  ?none ? ?TODAY'S TREATMENT  ? ?        ?-Reviewed pt presentation, HEP compliance, pain level, and response to prior Rx.   ?-Reviewed HEP ?-Pt performed: ? Sit to stands 3x10 reps from high/low table ?Marching on airex 2x15-20 with bilat Ues ?Step up on airex with 1 UE assist 2x10 reps ?Lateral step ups on airex 2x10 reps ?Step ups on 6 inch step with 1 UE assist 2x10 reps ?Seated clams with  GTB x25 reps and x15 reps ?Shuttle leg press x20 reps with 1-25 and 1-12. ? ?-See education below. ?  ?  ?PATIENT EDUCATION:  ?Education details:  POC, HEP, relevant anatomy, and rationale for exercises.  PT answered Pt's questions. ?Person educated: Patient ?Education method: Explanation, demonstration, verbal and tactile cues, handout ?Education comprehension: verbalized understanding, returned demonstration, verbal and tactile cues required, and needs further education ?  ?  ?HOME EXERCISE PROGRAM: ?Access Code: [email or web address] ?URL: [email or web address] ?Date: 06/02/2021 ?Prepared by: Ronny Flurry ? ?Exercises ?- Standing Hip Abduction with Counter Support  - 1 x daily - 5 x weekly - 2 sets - 10 reps ?- Seated Hip Abduction with Resistance  - 1 x daily - 3-4 x weekly - 2 sets - 10 reps ?  ?  ?ASSESSMENT: ?  ?CLINICAL IMPRESSION: ? Pt continues to use walker though states her dizziness has improved since MD reduced her lisinopril.  Pt has progressed well since last visit.  Pt reports improved pain and is only having pain when she lies on L LE currently.  She also reports feeling stronger and able to step into her house better.  Pt performed exercises well and demonstrates improved form and ease with closed chain activities.  Pt responded well to Rx having no pain after Rx.   ?  ?Objective impairments include Abnormal gait, decreased activity tolerance, decreased endurance, decreased mobility, difficulty walking, decreased strength, and pain. These impairments are limiting patient from cleaning, community activity, and ambulation and stairs . Personal factors including 3+ comorbidities: L LE wound, Bilat TKR, OA, and CHF  are also affecting patient's functional outcome.  ? ?  ?  REHAB POTENTIAL: Good ?  ?CLINICAL DECISION MAKING: Stable/uncomplicated ?  ?EVALUATION COMPLEXITY: Low ?  ?  ?GOALS: ?  ?  ?SHORT TERM GOALS: ?  ?STG Name Target Date Goal status  ?1 Pt will be independent and compliant with HEP  for improved pain, strength, and function.  ?Baseline:  04/06/2021 INITIAL  ?2 Pt will demo improved stance time on R LE and reduced antalgic limp with gait.  ?Baseline:  04/06/2021 PARTIALLY MET  ?         ?         ?         ?         ?         ?  ?LONG TERM GOALS:  ?  ?LTG Name Target Date Goal status  ?1 Pt will be able to enter home with R LE ascending step without increased pain. ?Baseline: 06/25/2021 NOT MET  ?2 Pt will report improved confidence with ambulation including gait stability. ?Baseline: 06/25/2021 NOT MET  ?3 Pt will report improved tolerance with standing activities including performing household chores without significant pain or difficulty.  ?Baseline: 04/27/2021 GOAL MET  ?4 Pt will demo improved R LE strength to 5/5 in hip flex and ER and knee ext and 4+/5 in hip abd for improved tolerance with and performance of functional mobility skills.  ?Baseline: 06/25/2021 33% met  ?5 Pt will report she is able to perform her normal ambulation without significant R LE pain.  ?Baseline: 04/28/2021 GOAL MET  ?PLAN: ?PT FREQUENCY: 1-2x/week ?  ?PT DURATION: 5 weeks ?  ?PLANNED INTERVENTIONS: Therapeutic exercises, Therapeutic activity, Neuro Muscular re-education, Balance training, Gait training, Patient/Family education, Joint mobilization, Stair training, DME instructions, Electrical stimulation, Spinal mobilization, Cryotherapy, Moist heat, Taping, and Manual therapy ?  ?PLAN FOR NEXT SESSION:  Possible discharge next visit.  Cont with hip and LE strengthening.  Review and perform HEP.  Monitor dizziness.   ? ?Selinda Michaels III PT, DPT ?06/26/21 12:34 PM ? ? ? ? ?  ? ?

## 2021-06-26 ENCOUNTER — Encounter (HOSPITAL_BASED_OUTPATIENT_CLINIC_OR_DEPARTMENT_OTHER): Payer: Self-pay | Admitting: Physical Therapy

## 2021-07-02 ENCOUNTER — Encounter (HOSPITAL_BASED_OUTPATIENT_CLINIC_OR_DEPARTMENT_OTHER): Payer: Medicare HMO | Admitting: Physical Therapy

## 2021-07-09 ENCOUNTER — Ambulatory Visit (HOSPITAL_BASED_OUTPATIENT_CLINIC_OR_DEPARTMENT_OTHER): Payer: Medicare HMO | Admitting: Physical Therapy

## 2021-07-09 DIAGNOSIS — M25551 Pain in right hip: Secondary | ICD-10-CM

## 2021-07-09 DIAGNOSIS — M6281 Muscle weakness (generalized): Secondary | ICD-10-CM

## 2021-07-09 NOTE — Therapy (Signed)
OUTPATIENT PHYSICAL THERAPY TREATMENT NOTE  / DISCHARGE SUMMARY      Patient Name: [name] MRN: [id number] DOB:Jan 29, 1936, 86 y.o., female Today's Date: 07/10/2021  PCP: Joya Gaskins, FNP    PT End of Session - 07/09/21 1418     Visit Number 6    Number of Visits 6    Authorization Type [employer]    PT Start Time 1505    PT Stop Time 1431    PT Time Calculation (min) 35 min    Activity Tolerance Patient tolerated treatment well    Behavior During Therapy WFL for tasks assessed/performed               Past Medical History:  Diagnosis Date   Arthritis    Cancer ([facility])    basal cell on face   Complication of anesthesia    History of kidney stones    Hypertension    PONV (postoperative nausea and vomiting)    Past Surgical History:  Procedure Laterality Date   DEBRIDEMENT AND CLOSURE WOUND Left 04/21/2021   Procedure: DEBRIDEMENT AND CLOSURE OF LEFT LOWER EXTREMITY  WOUND;  Surgeon: Lennice Sites, MD;  Location: [facility];  Service: Plastics;  Laterality: Left;  45 minutes   EYE SURGERY     JOINT REPLACEMENT     Patient Active Problem List   Diagnosis Date Noted   Open wound of left lower leg 04/14/2021   Pain due to onychomycosis of toenails of both feet 04/10/2019   Pincer nail deformity 04/10/2019   HYPERGLYCEMIA, FASTING 08/22/2009   OSTEOARTHRITIS 06/13/2009   HEADACHE 06/13/2009   NEPHROLITHIASIS, HX OF 06/13/2009   HYPERLIPIDEMIA 06/11/2009   MIGRAINE HEADACHE 06/11/2009   HYPERTENSION 06/11/2009   ARTHRITIS 06/11/2009   SHOULDER PAIN, LEFT 06/11/2009   CHEST DISCOMFORT 06/11/2009    REFERRING PROVIDER: Joya Gaskins, * REFERRING DIAG: M54.31 (ICD-10-CM) - Sciatica, right side    THERAPY DIAG:  Pain in right hip   Muscle weakness (generalized)   ONSET DATE: 02/02/2022   SUBJECTIVE:                                                                                                                                                                                             SUBJECTIVE STATEMENT: Pt states her MD decreased her lisinopril and her dizziness has improved.  Pt reports her leg pain has improved.  She is doing very well.  Pt reports she is stronger as she is able to step into her house better.  Pt states she is not having any pain.  Pt is able to perform her normal mobility  and daily activities without hip/LE pain.  Pt states she is lazy and doesn't want to walk.         PERTINENT HISTORY:  L LE wound, Bilat TKR, OA, CHF, prediabetes.  Pt reports having balance issues.    PAIN:  Are you having pain? No at rest NPRS scale: 0/10 currently at rest ; worst pain 0/10. Pain location: R glute/post hip     PRECAUTIONS: Other: L LE wound; fear of falling , uses a 4 wheeled walker for longer distance   WEIGHT BEARING RESTRICTIONS No   FALLS:  Has patient fallen in last 6 months? Yes, Number of falls: 1   LIVING ENVIRONMENT: Lives with: lives with their family and lives alone Lives in: House/apartment Stairs: 1 step to enter home without rail Has following equipment at home: [place] - 4 wheeled, canes   OCCUPATION: Pt is retired   PLOF: Independent; Pt uses a 4 wheeled walker for walking longer distance.     PATIENT GOALS walk better, reduce fear of falling     OBJECTIVE:    DIAGNOSTIC FINDINGS:  none  TODAY'S TREATMENT   PATIENT SURVEYS:  FOTO 83 with a goal of 88.  Has improved from prior 63.                    LE MMT:   MMT Right 05/21/2021 Left 05/21/2021  Hip flexion 5/5  5/5  Hip extension      Hip abduction <3/5  4+/5  Hip adduction      Hip internal rotation      Hip external rotation 5/5  5/5  Knee flexion 5/5    Knee extension 5/5  5/5  Ankle dorsiflexion      Ankle plantarflexion      Ankle inversion      Ankle eversion       (Blank rows = not tested)          GAIT: Assistive device utilized: None Comments:  Pt ambulated without an AD without limping.  She brought  in her upright walker today.            -Reviewed pt presentation, HEP compliance, pain level, and response to prior Rx.   -PT educated pt with using upright walker and correct positioning -Reviewed HEP and updated HEP.  Gave pt an updated HEP handout.  Educated pt in correct form and appropriate frequency.  Instructed pt in safe performance of sit to stand transfer.  -Pt performed:  Sit to stands 3x10 reps from high/low table Step ups on 6 inch step with 1 UE assist 2x10 reps Shuttle leg press x20 reps with 1-25 and 1-12.  -See education below.     PATIENT EDUCATION:  Education details:  POC, HEP, objective findings, progress/deficits, rationale for exercises.  PT answered Pt's questions. Person educated: Patient Education method: Explanation, demonstration, verbal and tactile cues, handout Education comprehension: verbalized understanding, returned demonstration, verbal and tactile cues required, and needs further education     HOME EXERCISE PROGRAM: Access Code: [email or web address] URL: [email or web address] Date: 07/09/2021 Prepared by: Ronny Flurry  Exercises - Standing Hip Abduction with Counter Support  - 1 x daily - 5 x weekly - 2 sets - 10 reps - Seated Hip Abduction with Resistance  - 1 x daily - 3-4 x weekly - 2 sets - 10 reps - Sit to Stand with Hands on Knees  - 1 x daily - 4 x weekly - 2 sets - 10 reps  ASSESSMENT:   CLINICAL IMPRESSION: Pt has made excellent progress.  She is not having any pain.  Pt demonstrates improved gait including stability with gait.  She has improved Hip ER strength though cont to have weakness in abduction with no improvement.  PT spent time educating pt in HEP and Pt demonstrates good understanding of HEP.  She is independent with HEP.  Pt has met all goals except hip abduction strength.  Pt is ready for discharge.    Objective impairments include Abnormal gait, decreased activity tolerance, decreased endurance, decreased  mobility, difficulty walking, decreased strength, and pain. These impairments are limiting patient from cleaning, community activity, and ambulation and stairs . Personal factors including 3+ comorbidities: L LE wound, Bilat TKR, OA, and CHF  are also affecting patient's functional outcome.     REHAB POTENTIAL: Good   CLINICAL DECISION MAKING: Stable/uncomplicated   EVALUATION COMPLEXITY: Low     GOALS:     SHORT TERM GOALS:   STG Name Target Date Goal status  1 Pt will be independent and compliant with HEP for improved pain, strength, and function.  Baseline:  04/06/2021 GOAL MET  2 Pt will demo improved stance time on R LE and reduced antalgic limp with gait.  Baseline:  04/06/2021 GOAL MET                                                 LONG TERM GOALS:    LTG Name Target Date Goal status  1 Pt will be able to enter home with R LE ascending step without increased pain. Baseline: 06/25/2021 GOAL MET  2 Pt will report improved confidence with ambulation including gait stability. Baseline: 06/25/2021 GOAL MET  3 Pt will report improved tolerance with standing activities including performing household chores without significant pain or difficulty.  Baseline: 04/27/2021 GOAL MET  4 Pt will demo improved R LE strength to 5/5 in hip flex and ER and knee ext and 4+/5 in hip abd for improved tolerance with and performance of functional mobility skills.  Baseline: 06/25/2021 66% met  5 Pt will report she is able to perform her normal ambulation without significant R LE pain.  Baseline: 04/28/2021 GOAL MET  PLAN: PT FREQUENCY: 1 visit   PT DURATION: 1 visit   PLANNED INTERVENTIONS: Therapeutic exercises, Therapeutic activity, Neuro Muscular re-education, Balance training, Gait training, Patient/Family education, Joint mobilization, Stair training, DME instructions, Electrical stimulation, Spinal mobilization, Cryotherapy, Moist heat, Taping, and Manual therapy   PLAN FOR NEXT SESSION:  Pt  is very appreciative of skilled PT services.  Pt to be discharged from skilled PT services due to meeting all goals except hip abd strength.  Pt is agreeable with discharge and will cont with HEP.     PHYSICAL THERAPY DISCHARGE SUMMARY  Visits from Start of Care: 6  Current functional level related to goals / functional outcomes: See above   Remaining deficits: See above   Education / Equipment: See above   See above for discharge reason.   Selinda Michaels III PT, DPT 07/10/21 4:45 PM

## 2021-07-10 ENCOUNTER — Encounter (HOSPITAL_BASED_OUTPATIENT_CLINIC_OR_DEPARTMENT_OTHER): Payer: Self-pay | Admitting: Physical Therapy

## 2021-07-14 NOTE — Progress Notes (Signed)
[facility] Note  07/23/2021     CHIEF COMPLAINT Patient presents for Retina Follow Up  HISTORY OF PRESENT ILLNESS: [name] is a 86 y.o. female who presents to the clinic today for:   HPI     Retina Follow Up   Patient presents with  Dry AMD.  In both eyes.  This started years ago.  Duration of 6 months.  Since onset it is stable.  I, the attending physician,  performed the HPI with the patient and updated documentation appropriately.        Comments   Patient states that she needs more light to read.       Last edited by Bernarda Caffey, MD on 07/25/2021  2:21 AM.     pt states  Referring physician: Joya Gaskins, FNP [id number] [place],  [place] [address]  HISTORICAL INFORMATION:   Selected notes from the MEDICAL RECORD NUMBER Referred by Dr. Gershon Crane for eval of AMD OU LEE: 08.25.21 Ocular Hx- Pseudophakia OU PMH- DM    CURRENT MEDICATIONS: No current outpatient medications on file. (Ophthalmic Drugs)   No current facility-administered medications for this visit. (Ophthalmic Drugs)   Current Outpatient Medications (Other)  Medication Sig   hydrALAZINE (APRESOLINE) 100 MG tablet Take 50 mg by mouth 2 (two) times daily.   isosorbide mononitrate (IMDUR) 30 MG 24 hr tablet Take 30 mg by mouth in the morning and at bedtime.   lisinopril (ZESTRIL) 20 MG tablet Take 20 mg by mouth daily.   metoprolol tartrate (LOPRESSOR) 50 MG tablet Take 50 mg by mouth 2 (two) times daily.   Multiple Vitamins-Minerals (MULTIVITAMIN WITH MINERALS) tablet Take 1 tablet by mouth daily.   pravastatin (PRAVACHOL) 40 MG tablet Take 40 mg by mouth at bedtime.   No current facility-administered medications for this visit. (Other)   REVIEW OF SYSTEMS: ROS   Positive for: Neurological, Musculoskeletal, Eyes Negative for: Constitutional, Gastrointestinal, Skin, Genitourinary, HENT, Endocrine, Cardiovascular, Respiratory, Psychiatric,  Allergic/Imm, Heme/Lymph Last edited by Annie Paras, COT on 07/23/2021  9:03 AM.     ALLERGIES Allergies  Allergen Reactions   Ciprofloxacin Itching and Rash   Lovastatin Itching and Rash   Sulfamethoxazole Itching and Rash   Sulfonamide Derivatives Itching and Rash   PAST MEDICAL HISTORY Past Medical History:  Diagnosis Date   Arthritis    Cancer ([place])    basal cell on face   Complication of anesthesia    History of kidney stones    Hypertension    PONV (postoperative nausea and vomiting)    Past Surgical History:  Procedure Laterality Date   DEBRIDEMENT AND CLOSURE WOUND Left 04/21/2021   Procedure: DEBRIDEMENT AND CLOSURE OF LEFT LOWER EXTREMITY  WOUND;  Surgeon: Lennice Sites, MD;  Location: [facility];  Service: Plastics;  Laterality: Left;  45 minutes   EYE SURGERY     JOINT REPLACEMENT     FAMILY HISTORY History reviewed. No pertinent family history.  SOCIAL HISTORY Social History   Tobacco Use   Smoking status: Never   Smokeless tobacco: Never  Vaping Use   Vaping Use: Never used  Substance Use Topics   Alcohol use: Never   Drug use: Never       OPHTHALMIC EXAM:  Base Eye Exam     Visual Acuity (Snellen - Linear)       Right Left   Dist cc 20/20 +1 20/25 +2    Correction: Glasses  Tonometry (Tonopen, 9:07 AM)       Right Left   Pressure 15 11         Pupils       Pupils Dark Light Shape React APD   Right PERRL 4 3 Round Brisk None   Left PERRL 4 3 Round Brisk None         Visual Fields       Left Right    Full Full         Extraocular Movement       Right Left    Full, Ortho Full, Ortho         Neuro/Psych     Oriented x3: Yes   Mood/Affect: Normal         Dilation     Both eyes: 1.0% Mydriacyl, 2.5% Phenylephrine @ 9:04 AM           Slit Lamp and Fundus Exam     Slit Lamp Exam       Right Left   Lids/Lashes Dermatochalasis - upper lid, mild MGD Dermatochalasis - upper lid, mild MGD    Conjunctiva/Sclera White and quiet White and quiet   Cornea arcus, 1+Punctate epithelial erosions, well healed cataract wound arcus, 1-2+fine Punctate epithelial erosions inferiorly   Anterior Chamber Deep and quiet Deep and quiet   Iris Round and dilated Round and dilated   Lens PC IOL in good position with open PC PC IOL in good position with open PC -- small opening   Anterior Vitreous Vitreous syneresis Vitreous syneresis, Posterior vitreous detachment         Fundus Exam       Right Left   Disc Mild pallor, Sharp, +cupping Pink and Sharp, mild PPA   C/D Ratio 0.65 0.5   Macula Flat, Blunted foveal reflex, Drusen, RPE mottling and clumping, No heme or edema Flat, Blunted foveal reflex, Drusen, RPE mottling and clumping, central small PED -- stable from prior, No heme or edema   Vessels attenuated, mild tortuosity attenuated, mild tortuosity   Periphery Attached, no heme, mild reticular degeneration Attached, mild reticular degeneration, No heme           Refraction     Wearing Rx       Sphere Cylinder Axis   Right -1.00 +0.50 085   Left -0.50 +1.00 105           IMAGING AND PROCEDURES  Imaging and Procedures for 07/23/2021  OCT, Retina - OU - Both Eyes       Right Eye Quality was good. Central Foveal Thickness: 247. Progression has been stable. Findings include normal foveal contour, no IRF, no SRF, retinal drusen .   Left Eye Quality was good. Central Foveal Thickness: 252. Progression has been stable. Findings include normal foveal contour, no IRF, no SRF, retinal drusen , pigment epithelial detachment, outer retinal atrophy.   Notes *Images captured and stored on drive  Diagnosis / Impression:  Non-exu ARMD OU  OS: mild central PEDs -- no significant change from prior  Clinical management:  See below  Abbreviations: NFP - Normal foveal profile. CME - cystoid macular edema. PED - pigment epithelial detachment. IRF - intraretinal fluid. SRF -  subretinal fluid. EZ - ellipsoid zone. ERM - epiretinal membrane. ORA - outer retinal atrophy. ORT - outer retinal tubulation. SRHM - subretinal hyper-reflective material. IRHM - intraretinal hyper-reflective material            ASSESSMENT/PLAN:    ICD-10-CM  1. Intermediate stage nonexudative age-related macular degeneration of both eyes  H35.3132 OCT, Retina - OU - Both Eyes    2. Essential hypertension  I10     3. Hypertensive retinopathy of both eyes  H35.033     4. Pseudophakia of both eyes  Z96.1      1. Age related macular degeneration, non-exudative, OU  - intermediate stage -- stable  - The incidence, anatomy, and pathology of dry AMD, risk of progression, and the AREDS and AREDS 2 studies including smoking risks discussed with patient.  - Continue amsler grid monitoring  - f/u 6-9 months, OCT, DFE  2,3. Hypertensive retinopathy OU - discussed importance of tight BP control - monitor  4. Pseudophakia OU  - s/p CE/IOL OU (Dr. Adora Fridge, [place])  - IOLs in good position, doing well  - monitor   Ophthalmic Meds Ordered this visit:  No orders of the defined types were placed in this encounter.     Return for f/u 6-9 months, non-exu ARMD OU, DFE, OCT.  This document serves as a record of services personally performed by Gardiner Sleeper, MD, PhD. It was created on their behalf by San Jetty. Owens Shark, OA an ophthalmic technician. The creation of this record is the provider's dictation and/or activities during the visit.    Electronically signed by: San Jetty. [place], [place] 05.23.2023 2:21 AM  Gardiner Sleeper, M.D., Ph.D. Diseases & Surgery of the Retina and Vitreous [facility]   I have reviewed the above documentation for accuracy and completeness, and I agree with the above. Gardiner Sleeper, M.D., Ph.D. 07/25/21 2:22 AM   Abbreviations: M myopia (nearsighted); A astigmatism; H hyperopia (farsighted); P presbyopia; Mrx spectacle  prescription;  CTL contact lenses; OD right eye; OS left eye; OU both eyes  XT exotropia; ET esotropia; PEK punctate epithelial keratitis; PEE punctate epithelial erosions; DES dry eye syndrome; MGD meibomian gland dysfunction; ATs artificial tears; PFAT's preservative free artificial tears; [place] nuclear sclerotic cataract; PSC posterior subcapsular cataract; ERM epi-retinal membrane; PVD posterior vitreous detachment; RD retinal detachment; DM diabetes mellitus; DR diabetic retinopathy; NPDR non-proliferative diabetic retinopathy; PDR proliferative diabetic retinopathy; CSME clinically significant macular edema; DME diabetic macular edema; dbh dot blot hemorrhages; CWS cotton wool spot; POAG primary open angle glaucoma; C/D cup-to-disc ratio; HVF humphrey visual field; GVF goldmann visual field; OCT optical coherence tomography; IOP intraocular pressure; BRVO Branch retinal vein occlusion; CRVO central retinal vein occlusion; CRAO central retinal artery occlusion; BRAO branch retinal artery occlusion; RT retinal tear; SB scleral buckle; PPV pars plana vitrectomy; VH Vitreous hemorrhage; PRP panretinal laser photocoagulation; IVK intravitreal kenalog; VMT vitreomacular traction; MH Macular hole;  NVD neovascularization of the disc; NVE neovascularization elsewhere; AREDS age related eye disease study; ARMD age related macular degeneration; POAG primary open angle glaucoma; EBMD epithelial/anterior basement membrane dystrophy; ACIOL anterior chamber intraocular lens; IOL intraocular lens; PCIOL posterior chamber intraocular lens; Phaco/IOL phacoemulsification with intraocular lens placement; [place] photorefractive keratectomy; LASIK laser assisted in situ keratomileusis; HTN hypertension; DM diabetes mellitus; COPD chronic obstructive pulmonary disease

## 2021-07-23 ENCOUNTER — Ambulatory Visit (INDEPENDENT_AMBULATORY_CARE_PROVIDER_SITE_OTHER): Payer: Medicare HMO | Admitting: Ophthalmology

## 2021-07-23 ENCOUNTER — Encounter (INDEPENDENT_AMBULATORY_CARE_PROVIDER_SITE_OTHER): Payer: Self-pay | Admitting: Ophthalmology

## 2021-07-23 DIAGNOSIS — H35033 Hypertensive retinopathy, bilateral: Secondary | ICD-10-CM | POA: Diagnosis not present

## 2021-07-23 DIAGNOSIS — Z961 Presence of intraocular lens: Secondary | ICD-10-CM

## 2021-07-23 DIAGNOSIS — H353132 Nonexudative age-related macular degeneration, bilateral, intermediate dry stage: Secondary | ICD-10-CM | POA: Diagnosis not present

## 2021-07-23 DIAGNOSIS — I10 Essential (primary) hypertension: Secondary | ICD-10-CM | POA: Diagnosis not present

## 2021-07-25 ENCOUNTER — Encounter (INDEPENDENT_AMBULATORY_CARE_PROVIDER_SITE_OTHER): Payer: Self-pay | Admitting: Ophthalmology

## 2021-07-28 ENCOUNTER — Ambulatory Visit: Payer: Medicare HMO | Admitting: Student

## 2021-07-28 DIAGNOSIS — L7682 Other postprocedural complications of skin and subcutaneous tissue: Secondary | ICD-10-CM

## 2021-07-28 DIAGNOSIS — Z9889 Other specified postprocedural states: Secondary | ICD-10-CM

## 2021-07-28 NOTE — Progress Notes (Signed)
Patient is an 86 year old female with past history including chronic left lower extremity wound.  Patient underwent complex closure of left leg wound with Dr. Erin Hearing on 04/21/2021.  Patient was last seen in the clinic on 05/18/2021.  At this visit, she was doing fairly well.  There was slight erythema surrounding the wound but overall it was healed and sutures are intact.  The sutures were removed and the patient was to follow-up for an as-needed basis.  Today, patient reports she is doing really well and is very pleased with the outcome of her surgery on her left lower extremity.  She does state though that there is a small "sharp" object protruding from her incision site that bothers her a little bit.  Other than that, she has no complaints or concerns.  Chaperone present on exam.  On exam, patient is sitting upright in no acute distress.  Incision has healed very well.  There are no signs of infection.  There is a small tip of the suture protruding from the superior aspect of the incision.  There is very mild irritation surrounding where the suture was protruding from.  This was cut and patient tolerated well.  I discussed with the patient that the sutures are dissolvable but they sometimes protrude from the skin.  I discussed with the patient that if this were to happen again come back to our clinic and we can cut the suture again.  Discussed with patient that she may put a little Vaseline over slightly irritated area.  Patient acknowledged.  Discussed with patient to call us if she has any other questions or concerns.  Picture was obtained and placed in the patient's chart with the patient's permission.

## 2021-07-29 NOTE — Addendum Note (Signed)
Addended by: Donnamarie Rossetti on: 07/29/2021 01:40 PM   Modules accepted: Level of Service

## 2022-03-25 NOTE — Progress Notes (Signed)
[facility] Note  03/29/2022     CHIEF COMPLAINT Patient presents for Retina Follow Up  HISTORY OF PRESENT ILLNESS: [name] is a 87 y.o. female who presents to the clinic today for:   HPI     Retina Follow Up   Patient presents with  Dry AMD.  In both eyes.  This started 6 months ago.  Duration of 6 months.  Since onset it is stable.  I, the attending physician,  performed the HPI with the patient and updated documentation appropriately.        Comments   6 month retina follow up ARMD OU pt is reporting no vision changes noticed she denies any flashes or floaters       Last edited by Bernarda Caffey, MD on 03/29/2022 11:22 PM.    Patient feels that the vision is the same.   Referring physician: Joya Gaskins, FNP [address] [place],  [place] [address]  HISTORICAL INFORMATION:   Selected notes from the MEDICAL RECORD NUMBER Referred by Dr. Gershon Crane for eval of AMD OU LEE: 08.25.21 Ocular Hx- Pseudophakia OU PMH- DM    CURRENT MEDICATIONS: No current outpatient medications on file. (Ophthalmic Drugs)   No current facility-administered medications for this visit. (Ophthalmic Drugs)   Current Outpatient Medications (Other)  Medication Sig   hydrALAZINE (APRESOLINE) 100 MG tablet Take 50 mg by mouth 2 (two) times daily.   isosorbide mononitrate (IMDUR) 30 MG 24 hr tablet Take 30 mg by mouth in the morning and at bedtime.   lisinopril (ZESTRIL) 10 MG tablet Take 10 mg by mouth daily.   metoprolol tartrate (LOPRESSOR) 50 MG tablet Take 50 mg by mouth 2 (two) times daily.   Multiple Vitamins-Minerals (MULTIVITAMIN WITH MINERALS) tablet Take 1 tablet by mouth daily.   pravastatin (PRAVACHOL) 40 MG tablet Take 40 mg by mouth at bedtime.   No current facility-administered medications for this visit. (Other)   REVIEW OF SYSTEMS: ROS   Positive for: Neurological, Musculoskeletal, Eyes Negative for: Constitutional,  Gastrointestinal, Skin, Genitourinary, HENT, Endocrine, Cardiovascular, Respiratory, Psychiatric, Allergic/Imm, Heme/Lymph Last edited by Parthenia Ames, COT on 03/29/2022  9:01 AM.     ALLERGIES Allergies  Allergen Reactions   Ciprofloxacin Itching and Rash   Lovastatin Itching and Rash   Sulfamethoxazole Itching and Rash   Sulfonamide Derivatives Itching and Rash   PAST MEDICAL HISTORY Past Medical History:  Diagnosis Date   Arthritis    Cancer ([place])    basal cell on face   Complication of anesthesia    History of kidney stones    Hypertension    PONV (postoperative nausea and vomiting)    Past Surgical History:  Procedure Laterality Date   DEBRIDEMENT AND CLOSURE WOUND Left 04/21/2021   Procedure: DEBRIDEMENT AND CLOSURE OF LEFT LOWER EXTREMITY  WOUND;  Surgeon: Lennice Sites, MD;  Location: [facility];  Service: Plastics;  Laterality: Left;  45 minutes   EYE SURGERY     JOINT REPLACEMENT     FAMILY HISTORY History reviewed. No pertinent family history.  SOCIAL HISTORY Social History   Tobacco Use   Smoking status: Never   Smokeless tobacco: Never  Vaping Use   Vaping Use: Never used  Substance Use Topics   Alcohol use: Never   Drug use: Never       OPHTHALMIC EXAM:  Base Eye Exam     Visual Acuity (Snellen - Linear)  Right Left   Dist cc 20/30 -1 20/30 -1   Dist ph cc NI NI    Correction: Glasses         Tonometry (Tonopen, 9:02 AM)       Right Left   Pressure 14 13         Pupils       Pupils Dark Light Shape React APD   Right PERRL 4 3 Round Brisk None   Left PERRL 4 3 Round Brisk None         Visual Fields       Left Right    Full Full         Extraocular Movement       Right Left    Full, Ortho Full, Ortho         Neuro/Psych     Oriented x3: Yes   Mood/Affect: Normal         Dilation     Both eyes: 2.5% Phenylephrine @ 9:05 AM           Slit Lamp and Fundus Exam     Slit Lamp Exam        Right Left   Lids/Lashes Dermatochalasis - upper lid, mild MGD, Meibomian gland dysfunction Dermatochalasis - upper lid, mild MGD, Meibomian gland dysfunction   Conjunctiva/Sclera White and quiet White and quiet   Cornea arcus, 1+Punctate epithelial erosions, well healed cataract wound arcus, 1-2+fine Punctate epithelial erosions inferiorly, Well healed temporal cataract wound   Anterior Chamber Deep and clear Deep and clear   Iris Round and dilated Round and dilated, No NVI   Lens PC IOL in good position with open PC PC IOL in good position with open PC -- small opening   Anterior Vitreous Vitreous syneresis Vitreous syneresis, Posterior vitreous detachment         Fundus Exam       Right Left   Disc Mild pallor, Sharp, +cupping, small disc heme 6 Pink and Sharp, mild PPA   C/D Ratio 0.65 0.5   Macula Flat, Blunted foveal reflex, Drusen, RPE mottling and clumping, No heme or edema Flat, Blunted foveal reflex, Drusen, RPE mottling and clumping, early atrophy, No heme or edema   Vessels attenuated, mild tortuosity attenuated, mild tortuosity   Periphery Attached, no heme, mild reticular degeneration Attached, mild reticular degeneration, No heme           Refraction     Wearing Rx       Sphere Cylinder Axis   Right -1.00 +0.50 085   Left -0.50 +1.00 105           IMAGING AND PROCEDURES  Imaging and Procedures for 03/29/2022  OCT, Retina - OU - Both Eyes       Right Eye Quality was good. Central Foveal Thickness: 243. Progression has been stable. Findings include normal foveal contour, no IRF, no SRF, retinal drusen .   Left Eye Quality was good. Central Foveal Thickness: 236. Progression has improved. Findings include normal foveal contour, no IRF, no SRF, retinal drusen , outer retinal atrophy (Interval improvement in central PED's-- converted to ORA).   Notes *Images captured and stored on drive  Diagnosis / Impression:  Non-exu ARMD OU  OS: Interval improvement  in central PED's-- converted to ORA  Clinical management:  See below  Abbreviations: NFP - Normal foveal profile. CME - cystoid macular edema. PED - pigment epithelial detachment. IRF - intraretinal fluid. SRF - subretinal fluid. EZ -  ellipsoid zone. ERM - epiretinal membrane. ORA - outer retinal atrophy. ORT - outer retinal tubulation. SRHM - subretinal hyper-reflective material. IRHM - intraretinal hyper-reflective material            ASSESSMENT/PLAN:    ICD-10-CM   1. Intermediate stage nonexudative age-related macular degeneration of both eyes  H35.3132 OCT, Retina - OU - Both Eyes    2. Essential hypertension  I10     3. Hypertensive retinopathy of both eyes  H35.033     4. Pseudophakia of both eyes  Z96.1     5. Dry eyes, bilateral  H04.123       1. Age related macular degeneration, non-exudative, OU  - intermediate stage -- stable  - OCT shows; OD Non Ex.-AMD, OS; Interval improvement in central PED's-- converted to ORA  - The incidence, anatomy, and pathology of dry AMD, risk of progression, and the AREDS and AREDS 2 studies including smoking risks discussed with patient.  - Continue amsler grid monitoring  - f/u 6 months-- OCT, DFE  2,3. Hypertensive retinopathy OU - discussed importance of tight BP control - continue to monitor  4. Pseudophakia OU  - s/p CE/IOL OU (Dr. Adora Fridge, [place])  - IOLs in good position, doing well  - continue to monitor  5. Dry eyes OU - recommend artificial tears and lubricating ointment as needed   Ophthalmic Meds Ordered this visit:  No orders of the defined types were placed in this encounter.     Return in about 6 months (around 09/27/2022) for f/u ARMD OU , DFE, OCT.  This document serves as a record of services personally performed by Gardiner Sleeper, MD, PhD. It was created on their behalf by San Jetty. Owens Shark, OA an ophthalmic technician. The creation of this record is the provider's dictation and/or activities  during the visit.    Electronically signed by: San Jetty. Owens Shark, [place] 02.05.2024 11:24 PM  This document serves as a record of services personally performed by Gardiner Sleeper, MD, PhD. It was created on their behalf by Renaldo Reel, [place] an ophthalmic technician. The creation of this record is the provider's dictation and/or activities during the visit.    Electronically signed by:  Renaldo Reel, COT  02.05.24 11:24 PM  Gardiner Sleeper, M.D., Ph.D. Diseases & Surgery of the Retina and Vitreous [facility]  I have reviewed the above documentation for accuracy and completeness, and I agree with the above. Gardiner Sleeper, M.D., Ph.D. 03/29/22 11:24 PM   Abbreviations: M myopia (nearsighted); A astigmatism; H hyperopia (farsighted); P presbyopia; Mrx spectacle prescription;  CTL contact lenses; OD right eye; OS left eye; OU both eyes  XT exotropia; ET esotropia; PEK punctate epithelial keratitis; PEE punctate epithelial erosions; DES dry eye syndrome; MGD meibomian gland dysfunction; ATs artificial tears; PFAT's preservative free artificial tears; [place] nuclear sclerotic cataract; PSC posterior subcapsular cataract; ERM epi-retinal membrane; PVD posterior vitreous detachment; RD retinal detachment; DM diabetes mellitus; DR diabetic retinopathy; NPDR non-proliferative diabetic retinopathy; PDR proliferative diabetic retinopathy; CSME clinically significant macular edema; DME diabetic macular edema; dbh dot blot hemorrhages; CWS cotton wool spot; POAG primary open angle glaucoma; C/D cup-to-disc ratio; HVF humphrey visual field; GVF goldmann visual field; OCT optical coherence tomography; IOP intraocular pressure; BRVO Branch retinal vein occlusion; CRVO central retinal vein occlusion; CRAO central retinal artery occlusion; BRAO branch retinal artery occlusion; RT retinal tear; SB scleral buckle; PPV pars plana vitrectomy; VH Vitreous hemorrhage; PRP panretinal laser  photocoagulation; IVK intravitreal kenalog; VMT vitreomacular traction; MH Macular hole;  NVD neovascularization of the disc; NVE neovascularization elsewhere; AREDS age related eye disease study; ARMD age related macular degeneration; POAG primary open angle glaucoma; EBMD epithelial/anterior basement membrane dystrophy; ACIOL anterior chamber intraocular lens; IOL intraocular lens; PCIOL posterior chamber intraocular lens; Phaco/IOL phacoemulsification with intraocular lens placement; PRK photorefractive keratectomy; LASIK laser assisted in situ keratomileusis; HTN hypertension; DM diabetes mellitus; COPD chronic obstructive pulmonary disease 

## 2022-03-29 ENCOUNTER — Encounter (INDEPENDENT_AMBULATORY_CARE_PROVIDER_SITE_OTHER): Payer: Self-pay | Admitting: Ophthalmology

## 2022-03-29 ENCOUNTER — Ambulatory Visit (INDEPENDENT_AMBULATORY_CARE_PROVIDER_SITE_OTHER): Payer: Medicare HMO | Admitting: Ophthalmology

## 2022-03-29 DIAGNOSIS — H04123 Dry eye syndrome of bilateral lacrimal glands: Secondary | ICD-10-CM

## 2022-03-29 DIAGNOSIS — H35033 Hypertensive retinopathy, bilateral: Secondary | ICD-10-CM | POA: Diagnosis not present

## 2022-03-29 DIAGNOSIS — Z961 Presence of intraocular lens: Secondary | ICD-10-CM

## 2022-03-29 DIAGNOSIS — H353132 Nonexudative age-related macular degeneration, bilateral, intermediate dry stage: Secondary | ICD-10-CM | POA: Diagnosis not present

## 2022-03-29 DIAGNOSIS — I10 Essential (primary) hypertension: Secondary | ICD-10-CM

## 2022-09-22 NOTE — Progress Notes (Signed)
[facility] - Clinic Note  09/27/2022     CHIEF COMPLAINT Patient presents for Retina Follow Up  HISTORY OF PRESENT ILLNESS: [name] is a 87 y.o. female who presents to the clinic today for:   HPI     Retina Follow Up   Patient presents with  Dry AMD.  In both eyes.  This started 6 months ago.  Duration of 6 months.  Since onset it is stable.  I, the attending physician,  performed the HPI with the patient and updated documentation appropriately.        Comments   6 month ARMD pt is reporting no vision changes noticed she denies any flashes or floaters       Last edited by Rennis Chris, MD on 09/27/2022  6:32 PM.     Patient states she does not feel like her vision has changed  Referring physician: Trisha Mangle, FNP [address]  [place] [address]  HISTORICAL INFORMATION:   Selected notes from the MEDICAL RECORD NUMBER Referred by Dr. Nile Riggs for eval of AMD OU LEE: 08.25.21 Ocular Hx- Pseudophakia OU PMH- DM    CURRENT MEDICATIONS: No current outpatient medications on file. (Ophthalmic Drugs)   No current facility-administered medications for this visit. (Ophthalmic Drugs)   Current Outpatient Medications (Other)  Medication Sig   hydrALAZINE (APRESOLINE) 100 MG tablet Take 50 mg by mouth 2 (two) times daily.   isosorbide mononitrate (IMDUR) 30 MG 24 hr tablet Take 30 mg by mouth in the morning and at bedtime.   lisinopril (ZESTRIL) 10 MG tablet Take 10 mg by mouth daily.   metoprolol tartrate (LOPRESSOR) 50 MG tablet Take 50 mg by mouth 2 (two) times daily.   Multiple Vitamins-Minerals (MULTIVITAMIN WITH MINERALS) tablet Take 1 tablet by mouth daily.   pravastatin (PRAVACHOL) 40 MG tablet Take 40 mg by mouth at bedtime.   No current facility-administered medications for this visit. (Other)   REVIEW OF SYSTEMS: ROS   Positive for: Neurological, Musculoskeletal, Eyes Negative for: Constitutional, Gastrointestinal,  Skin, Genitourinary, HENT, Endocrine, Cardiovascular, Respiratory, Psychiatric, Allergic/Imm, Heme/Lymph Last edited by Etheleen Mayhew, COT on 09/27/2022  9:23 AM.      ALLERGIES Allergies  Allergen Reactions   Ciprofloxacin Itching and Rash   Lovastatin Itching and Rash   Sulfamethoxazole Itching and Rash   Sulfonamide Derivatives Itching and Rash   PAST MEDICAL HISTORY Past Medical History:  Diagnosis Date   Arthritis    Cancer (HCC)    basal cell on face   Complication of anesthesia    History of kidney stones    Hypertension    PONV (postoperative nausea and vomiting)    Past Surgical History:  Procedure Laterality Date   DEBRIDEMENT AND CLOSURE WOUND Left 04/21/2021   Procedure: DEBRIDEMENT AND CLOSURE OF LEFT LOWER EXTREMITY  WOUND;  Surgeon: Janne Napoleon, MD;  Location: [facility] OR;  Service: Plastics;  Laterality: Left;  45 minutes   EYE SURGERY     JOINT REPLACEMENT     FAMILY HISTORY History reviewed. No pertinent family history.  SOCIAL HISTORY Social History   Tobacco Use   Smoking status: Never   Smokeless tobacco: Never  Vaping Use   Vaping status: Never Used  Substance Use Topics   Alcohol use: Never   Drug use: Never       OPHTHALMIC EXAM:  Base Eye Exam     Visual Acuity (Snellen - Linear)  Right Left   Dist cc 20/20 -2 20/30 -2   Dist ph cc  NI    Correction: Glasses         Tonometry (Tonopen, 9:28 AM)       Right Left   Pressure 7 10         Pupils       Pupils Dark Light Shape React APD   Right PERRL 4 3 Round Brisk None   Left PERRL 4 3 Round Brisk None         Visual Fields       Left Right    Full Full         Extraocular Movement       Right Left    Full, Ortho Full, Ortho         Neuro/Psych     Oriented x3: Yes   Mood/Affect: Normal         Dilation     Both eyes: 2.5% Phenylephrine @ 9:28 AM           Slit Lamp and Fundus Exam     Slit Lamp Exam       Right Left    Lids/Lashes Dermatochalasis - upper lid Dermatochalasis - upper lid   Conjunctiva/Sclera White and quiet White and quiet   Cornea arcus, 1+Punctate epithelial erosions, well healed cataract wound arcus, 1-2+Punctate epithelial erosions inferiorly, Well healed temporal cataract wound   Anterior Chamber Deep and clear Deep and clear   Iris Round and dilated Round and dilated, No NVI   Lens PC IOL in good position with open PC PC IOL in good position with open PC -- small opening   Anterior Vitreous mild syneresis Mild syneresis, Posterior vitreous detachment         Fundus Exam       Right Left   Disc Mild pallor, Sharp rim, +cupping, no heme mild Pallor, Sharp rim, mild PPA   C/D Ratio 0.65 0.5   Macula Flat, Blunted foveal reflex, Drusen, RPE mottling and clumping, No heme or edema Flat, Blunted foveal reflex, Drusen, RPE mottling and clumping, early atrophy, No heme or edema   Vessels attenuated, Tortuous attenuated, Tortuous   Periphery Attached, no heme, mild reticular degeneration Attached, mild reticular degeneration, No heme           Refraction     Wearing Rx       Sphere Cylinder Axis   Right -1.00 +0.50 085   Left -0.50 +1.00 105           IMAGING AND PROCEDURES  Imaging and Procedures for 09/27/2022  OCT, Retina - OU - Both Eyes       Right Eye Quality was good. Central Foveal Thickness: 242. Progression has been stable. Findings include normal foveal contour, no IRF, no SRF, retinal drusen .   Left Eye Quality was good. Central Foveal Thickness: 242. Progression has been stable. Findings include normal foveal contour, no IRF, no SRF, retinal drusen , outer retinal atrophy (Trace central PED's -- converting to patchy ORA).   Notes *Images captured and stored on drive  Diagnosis / Impression:  Non-exu ARMD OU  OS: Trace central PED's -- converting to patchy ORA  Clinical management:  See below  Abbreviations: NFP - Normal foveal profile. CME -  cystoid macular edema. PED - pigment epithelial detachment. IRF - intraretinal fluid. SRF - subretinal fluid. EZ - ellipsoid zone. ERM - epiretinal membrane. ORA - outer retinal atrophy. ORT -  outer retinal tubulation. SRHM - subretinal hyper-reflective material. IRHM - intraretinal hyper-reflective material            ASSESSMENT/PLAN:    ICD-10-CM   1. Intermediate stage nonexudative age-related macular degeneration of both eyes  H35.3132 OCT, Retina - OU - Both Eyes    2. Essential hypertension  I10     3. Hypertensive retinopathy of both eyes  H35.033     4. Pseudophakia of both eyes  Z96.1     5. Dry eyes, bilateral  H04.123        1. Age related macular degeneration, non-exudative, OU  - intermediate stage -- stable  - OCT shows Non-exu ARMD OU; OS: Trace central PED's -- converting to patchy ORA  - The incidence, anatomy, and pathology of dry AMD, risk of progression, and the AREDS and AREDS 2 studies including smoking risks discussed with patient.  - Continue amsler grid monitoring  - f/u 6 months-- OCT, DFE  2,3. Hypertensive retinopathy OU - discussed importance of tight BP control - continue to monitor  4. Pseudophakia OU  - s/p CE/IOL OU (Dr. Albin Fischer, [place])  - IOLs in good position, doing well  - continue to monitor  5. Dry eyes OU - recommend artificial tears and lubricating ointment as needed  Ophthalmic Meds Ordered this visit:  No orders of the defined types were placed in this encounter.     Return in about 6 months (around 03/30/2023) for f/u non-exu ARMD OU, DFE, OCT.  This document serves as a record of services personally performed by Karie Chimera, MD, PhD. It was created on their behalf by Annalee Genta, COMT. The creation of this record is the provider's dictation and/or activities during the visit.  Electronically signed by: Annalee Genta, COMT 09/27/22 6:33 PM  This document serves as a record of services personally performed by  Karie Chimera, MD, PhD. It was created on their behalf by Glee Arvin. Manson Passey, OA an ophthalmic technician. The creation of this record is the provider's dictation and/or activities during the visit.    Electronically signed by: Glee Arvin. Manson Passey, OA 09/27/22 6:33 PM  Karie Chimera, M.D., Ph.D. Diseases & Surgery of the Retina and Vitreous [facility]  I have reviewed the above documentation for accuracy and completeness, and I agree with the above. Karie Chimera, M.D., Ph.D. 09/27/22 6:34 PM    Abbreviations: M myopia (nearsighted); A astigmatism; H hyperopia (farsighted); P presbyopia; Mrx spectacle prescription;  CTL contact lenses; OD right eye; OS left eye; OU both eyes  XT exotropia; ET esotropia; PEK punctate epithelial keratitis; PEE punctate epithelial erosions; DES dry eye syndrome; MGD meibomian gland dysfunction; ATs artificial tears; PFAT's preservative free artificial tears; NSC nuclear sclerotic cataract; PSC posterior subcapsular cataract; ERM epi-retinal membrane; PVD posterior vitreous detachment; RD retinal detachment; DM diabetes mellitus; DR diabetic retinopathy; NPDR non-proliferative diabetic retinopathy; PDR proliferative diabetic retinopathy; CSME clinically significant macular edema; DME diabetic macular edema; dbh dot blot hemorrhages; CWS cotton wool spot; POAG primary open angle glaucoma; C/D cup-to-disc ratio; HVF humphrey visual field; GVF goldmann visual field; OCT optical coherence tomography; IOP intraocular pressure; BRVO Branch retinal vein occlusion; CRVO central retinal vein occlusion; CRAO central retinal artery occlusion; BRAO branch retinal artery occlusion; RT retinal tear; SB scleral buckle; PPV pars plana vitrectomy; VH Vitreous hemorrhage; PRP panretinal laser photocoagulation; IVK intravitreal kenalog; VMT vitreomacular traction; MH Macular hole;  NVD neovascularization of the disc; NVE neovascularization elsewhere; AREDS  age related  eye disease study; ARMD age related macular degeneration; POAG primary open angle glaucoma; EBMD epithelial/anterior basement membrane dystrophy; ACIOL anterior chamber intraocular lens; IOL intraocular lens; PCIOL posterior chamber intraocular lens; Phaco/IOL phacoemulsification with intraocular lens placement; PRK photorefractive keratectomy; LASIK laser assisted in situ keratomileusis; HTN hypertension; DM diabetes mellitus; COPD chronic obstructive pulmonary disease

## 2022-09-27 ENCOUNTER — Ambulatory Visit (INDEPENDENT_AMBULATORY_CARE_PROVIDER_SITE_OTHER): Payer: Medicare HMO | Admitting: Ophthalmology

## 2022-09-27 ENCOUNTER — Encounter (INDEPENDENT_AMBULATORY_CARE_PROVIDER_SITE_OTHER): Payer: Self-pay | Admitting: Ophthalmology

## 2022-09-27 DIAGNOSIS — H353132 Nonexudative age-related macular degeneration, bilateral, intermediate dry stage: Secondary | ICD-10-CM | POA: Diagnosis not present

## 2022-09-27 DIAGNOSIS — Z961 Presence of intraocular lens: Secondary | ICD-10-CM

## 2022-09-27 DIAGNOSIS — H04123 Dry eye syndrome of bilateral lacrimal glands: Secondary | ICD-10-CM

## 2022-09-27 DIAGNOSIS — I10 Essential (primary) hypertension: Secondary | ICD-10-CM

## 2022-09-27 DIAGNOSIS — H35033 Hypertensive retinopathy, bilateral: Secondary | ICD-10-CM | POA: Diagnosis not present

## 2022-11-29 ENCOUNTER — Other Ambulatory Visit (HOSPITAL_COMMUNITY): Payer: Self-pay

## 2022-11-29 ENCOUNTER — Ambulatory Visit: Payer: Medicare HMO | Attending: Cardiology

## 2022-11-29 ENCOUNTER — Encounter: Payer: Self-pay | Admitting: Cardiology

## 2022-11-29 ENCOUNTER — Ambulatory Visit: Payer: Medicare HMO | Attending: Cardiology | Admitting: Cardiology

## 2022-11-29 VITALS — BP 170/90 | HR 82 | Resp 18 | Ht 60.0 in | Wt 142.2 lb

## 2022-11-29 DIAGNOSIS — R002 Palpitations: Secondary | ICD-10-CM | POA: Diagnosis not present

## 2022-11-29 DIAGNOSIS — I10 Essential (primary) hypertension: Secondary | ICD-10-CM

## 2022-11-29 MED ORDER — HYDRALAZINE HCL 100 MG PO TABS
100.0000 mg | ORAL_TABLET | Freq: Two times a day (BID) | ORAL | 1 refills | Status: AC
  Filled 2022-11-29: qty 120, 60d supply, fill #0

## 2022-11-29 MED ORDER — HYDRALAZINE HCL 100 MG PO TABS
100.0000 mg | ORAL_TABLET | Freq: Two times a day (BID) | ORAL | 3 refills | Status: DC
  Filled 2022-11-29: qty 120, 60d supply, fill #0

## 2022-11-29 NOTE — Patient Instructions (Addendum)
Medication Instructions:  Begin Hydralazine 100mg . Take the tablet 2 times a day *If you need a refill on your cardiac medications before your next appointment, please call your pharmacy*   Lab Work: No labs ordered If you have labs (blood work) drawn today and your tests are completely normal, you will receive your results only by: MyChart Message (if you have MyChart) OR A paper copy in the mail If you have any lab test that is abnormal or we need to change your treatment, we will call you to review the results.   Testing/Procedures:  Your physician has requested that you have an echocardiogram. Echocardiography is a painless test that uses sound waves to create images of your heart. It provides your doctor with information about the size and shape of your heart and how well your heart's chambers and valves are working. This procedure takes approximately one hour. There are no restrictions for this procedure. Please do NOT wear cologne, perfume, aftershave, or lotions (deodorant is allowed).  Please arrive 15 minutes prior to your appointment time.   ZIO XT- Long Term Monitor Instructions   Your physician has requested you wear your ZIO patch monitor___14____days.   This is a single patch monitor.  [employer] supplies one patch monitor per enrollment.  Additional stickers are not available.   Please do not apply patch if you will be having a Nuclear Stress Test, Echocardiogram, Cardiac CT, MRI, or Chest Xray during the time frame you would be wearing the monitor. The patch cannot be worn during these tests.  You cannot remove and re-apply the ZIO XT patch monitor.   Your ZIO patch monitor will be sent USPS Priority mail from [facility] directly to your home address. The monitor may also be mailed to a PO BOX if home delivery is not available.   It may take 3-5 days to receive your monitor after you have been enrolled.   Once you have received you monitor, please review enclosed  instructions.  Your monitor has already been registered assigning a specific monitor serial # to you.   Applying the monitor   Shave hair from upper left chest.   Hold abrader disc by orange tab.  Rub abrader in 40 strokes over left upper chest as indicated in your monitor instructions.   Clean area with 4 enclosed alcohol pads .  Use all pads to assure are is cleaned thoroughly.  Let dry.   Apply patch as indicated in monitor instructions.  Patch will be place under collarbone on left side of chest with arrow pointing upward.   Rub patch adhesive wings for 2 minutes.Remove white label marked "1".  Remove white label marked "2".  Rub patch adhesive wings for 2 additional minutes.   While looking in a mirror, press and release button in center of patch.  A small green light will flash 3-4 times .  This will be your only indicator the monitor has been turned on.     Do not shower for the first 24 hours.  You may shower after the first 24 hours.   Press button if you feel a symptom. You will hear a small click.  Record Date, Time and Symptom in the Patient Log Book.   When you are ready to remove patch, follow instructions on last 2 pages of Patient Log Book.  Stick patch monitor onto last page of Patient Log Book.   Place Patient Log Book in [place] box.  Use locking tab on box and  tape box closed securely.  The [employer] and [employer] has [employer] on it.  Please place in mailbox as soon as possible.  Your physician should have your test results approximately 7 days after the monitor has been mailed back to [facility].   Call [facility] Customer Care at [phone] if you have questions regarding your ZIO XT patch monitor.  Call them immediately if you see an orange light blinking on your monitor.   If your monitor falls off in less than 4 days contact our Monitor department at [phone].  If your monitor becomes loose or falls off after 4 days call [employer] at [phone] for  suggestions on securing your monitor.     Follow-Up: At [facility], you and your health needs are our priority.  As part of our continuing mission to provide you with exceptional heart care, we have created designated Provider Care Teams.  These Care Teams include your primary Cardiologist (physician) and Advanced Practice Providers (APPs -  Physician Assistants and Nurse Practitioners) who all work together to provide you with the care you need, when you need it.  We recommend signing up for the patient portal called "MyChart".  Sign up information is provided on this After Visit Summary.  MyChart is used to connect with patients for Virtual Visits (Telemedicine).  Patients are able to view lab/test results, encounter notes, upcoming appointments, etc.  Non-urgent messages can be sent to your provider as well.   To learn more about what you can do with MyChart, go to [email or web address].    Your next appointment:   As needed   Provider:   Truett Mainland, MD

## 2022-11-29 NOTE — Progress Notes (Signed)
Cardiology Office Note:  .   Date:  11/29/2022  ID:  [name], DOB 1935-03-08, MRN [id number] PCP: Diane Mangle, FNP  [facility] Providers Cardiologist:  Truett Mainland, MD PCP: Diane Mangle, FNP  Chief Complaint  Patient presents with   Palpitations   Hypertension   Congestive Heart Failure      History of Present Illness: .    [name] is a 87 y.o. female with hypertension, hyperlipidemia, tachycardia  Patient lives with her daughter, is fairly independent.  She walks her dog in the backyard.  With her level of activity, she denies any chest pain, shortness of breath symptoms.  Blood pressure is elevated today, but is usually better at home.  She also also had episodes of tachycardia as noted on monitor, without any perceived symptoms of palpitations.  She was prescribed Lasix by her PCP, but she does not like taking it, as it makes her have accidents, in the setting of overactive bladder.  She does not drink alcohol and tries to limit her intake of salt. She drinks skim milk, low-sodium juice, and occasionally coffee. She does not drink carbonated beverages.  She takes lisinopril 10mg  once daily, metoprolol 50mg  twice daily, isosorbide mononitrate twice daily, and hydralazine 50mg  (half a pill) twice daily. She reports that she had to reduce her lisinopril dosage from 20mg  to 10mg  due to severe heartburn. She is unsure why she was prescribed isosorbide mononitrate, as she does not have chest pain.  Vitals:   11/29/22 0856  BP: (!) 170/90  Pulse: 82  Resp: 18  SpO2: 98%     ROS:  Review of Systems  Cardiovascular:  Negative for chest pain, dyspnea on exertion, leg swelling, palpitations and syncope.     Studies Reviewed: Marland Kitchen        EKG 11/29/2022:  Sinus rhythm with occasional Premature ventricular complexes and Premature atrial complexes Septal infarct (cited on or before 15-Apr-2021) When compared with ECG of 15-Apr-2021  09:11, Premature ventricular complexes are now Present      Physical Exam:   Physical Exam Vitals and nursing note reviewed.  Constitutional:      General: She is not in acute distress. Neck:     Vascular: No JVD.  Cardiovascular:     Rate and Rhythm: Normal rate and regular rhythm.     Heart sounds: Normal heart sounds. No murmur heard. Pulmonary:     Effort: Pulmonary effort is normal.     Breath sounds: Normal breath sounds. No wheezing or rales.  Musculoskeletal:     Right lower leg: No edema.     Left lower leg: No edema.      VISIT DIAGNOSES:   ICD-10-CM   1. Essential hypertension  I10 EKG 12-Lead    2. Tachycardia  R00.2        ASSESSMENT AND PLAN: .    [name] is a 87 y.o. female with  hypertension, hyperlipidemia, tachycardia  Hypertension: Blood pressure elevated today, more so than it is at home. Increase hydralazine from 50 mg twice daily to 100 mg twice daily. Continue lisinopril, isosorbide, metoprolol, as is. Check echocardiogram. Continue follow-up with PCP.  If blood pressure continues to remain elevated, could consider adding amlodipine. While she may have had occasional leg edema improved with Lasix, I do not see any evidence of heart failure based on her history or physical exam today.  Tachycardia: Episodes are infrequent, not particularly perceived by the patient has palpitations.  Recommend 2-week Zio patch monitor.  I will see her as needed, unless any significant abnormalities found on the above testing.   Meds ordered this encounter  Medications   hydrALAZINE (APRESOLINE) 100 MG tablet    Sig: Take 1 tablet (100 mg total) by mouth 2 (two) times daily.    Dispense:  120 tablet    Refill:  3     F/u as needed  Signed, Elder Negus, MD

## 2022-11-29 NOTE — Progress Notes (Unsigned)
Enrolled for [employer] to mail a ZIO XT long term holter monitor to the patients address on file.  

## 2022-12-02 DIAGNOSIS — R002 Palpitations: Secondary | ICD-10-CM | POA: Diagnosis not present

## 2022-12-09 ENCOUNTER — Other Ambulatory Visit (HOSPITAL_COMMUNITY): Payer: Self-pay

## 2022-12-16 ENCOUNTER — Ambulatory Visit (HOSPITAL_COMMUNITY): Payer: Medicare HMO | Attending: Cardiology

## 2022-12-16 DIAGNOSIS — R002 Palpitations: Secondary | ICD-10-CM | POA: Diagnosis present

## 2022-12-16 DIAGNOSIS — R000 Tachycardia, unspecified: Secondary | ICD-10-CM | POA: Diagnosis not present

## 2022-12-16 DIAGNOSIS — E785 Hyperlipidemia, unspecified: Secondary | ICD-10-CM | POA: Insufficient documentation

## 2022-12-16 DIAGNOSIS — I110 Hypertensive heart disease with heart failure: Secondary | ICD-10-CM | POA: Insufficient documentation

## 2022-12-16 DIAGNOSIS — I361 Nonrheumatic tricuspid (valve) insufficiency: Secondary | ICD-10-CM

## 2022-12-16 DIAGNOSIS — R0789 Other chest pain: Secondary | ICD-10-CM | POA: Diagnosis not present

## 2022-12-16 DIAGNOSIS — I509 Heart failure, unspecified: Secondary | ICD-10-CM | POA: Insufficient documentation

## 2022-12-16 DIAGNOSIS — I083 Combined rheumatic disorders of mitral, aortic and tricuspid valves: Secondary | ICD-10-CM | POA: Insufficient documentation

## 2022-12-16 LAB — ECHOCARDIOGRAM COMPLETE
Area-P 1/2: 4.86 cm2
MV M vel: 5.98 m/s
MV Peak grad: 143 mm[Hg]
S' Lateral: 2.3 cm

## 2022-12-17 ENCOUNTER — Telehealth: Payer: Self-pay | Admitting: *Deleted

## 2022-12-17 DIAGNOSIS — I272 Pulmonary hypertension, unspecified: Secondary | ICD-10-CM

## 2022-12-17 NOTE — Progress Notes (Signed)
Message for the patient :  "Your pressure on the right side of the heart is elevated, we will call this pulmonary hypertension.  The reason for this could be multifactorial, but possibly related to moderate leakiness of mitral valve.  If you do not have any symptoms of shortness of breath at this time, I would recommend repeat echocardiogram in 6 months for serial monitoring and then follow-up visit with me.  Please let me know if you have any questions."  Thanks MJP

## 2022-12-17 NOTE — Telephone Encounter (Signed)
-----   Message from [facility] sent at 12/17/2022  4:59 PM EDT ----- Message for the patient :  "Your pressure on the right side of the heart is elevated, we will call this pulmonary hypertension.  The reason for this could be multifactorial, but possibly related to moderate leakiness of mitral valve.  If you do not have any symptoms of shortness of breath at this time, I would recommend repeat echocardiogram in 6 months for serial monitoring and then follow-up visit with me.  Please let me know if you have any questions."  Thanks MJP

## 2022-12-17 NOTE — Telephone Encounter (Signed)
The patient has been notified of the result and verbalized understanding.  All questions (if any) were answered.  Pt states she is asymptomatic from a cardiac perspective, with no complaints of sob, doe, or swelling to her lower extremities.  She states she feels well and attends PT several times a week, to help with her gait.   Pt aware we will order for her to get a repeat echo done in 6 months, as well as a follow-up appt to see Dr. Rosemary Holms at that time.   Pt aware she will get a call back sometime next week to arrange her 6 month echo appt.  She is aware that we will call her in the next month or so to arrange her 6 month follow-up appt with Dr. Rosemary Holms, when his schedule his open and available at that time.  6 month recall placed for the pt to see Dr. Rosemary Holms in 6 months.   Pt verbalized understanding and agrees with this plan.

## 2022-12-24 NOTE — Progress Notes (Signed)
Episodes of rapid heartbeat from top chamber of the heart noted.  Longest of these episodes.  25-minute long, correlated with patient's symptoms. Vagal maneuvers can abort some of these rapid heartbeat episodes. Metoprolol tartrate 50 mg twice daily should help with the symptoms. In spite of that, if symptoms are persistent, we could add another agent diltiazem 120 mg daily, it can substitute patient's current medication isosorbide mononitrate 30 mg daily, if patient would like to limit number of medications. I will see her for follow-up in 3 months.  Thanks MJP

## 2022-12-28 ENCOUNTER — Telehealth: Payer: Self-pay

## 2022-12-28 MED ORDER — DILTIAZEM HCL ER COATED BEADS 120 MG PO CP24: 120.0000 mg | ORAL_CAPSULE | Freq: Every day | ORAL | 3 refills | Status: DC

## 2022-12-28 NOTE — Telephone Encounter (Signed)
The patient has been notified of the result and verbalized understanding.  All questions (if any) were answered. Frutoso Schatz, RN 12/28/2022 9:56 AM  Patient states that she would like to try the diltiazem. Advised that she can discontinue isosorbide. Prescription has been sent in. I have also scheduled her for a follow up appointment.

## 2022-12-28 NOTE — Telephone Encounter (Signed)
-----   Message from [facility] sent at 12/24/2022  7:11 PM EDT ----- Episodes of rapid heartbeat from top chamber of the heart noted.  Longest of these episodes.  25-minute long, correlated with patient's symptoms. Vagal maneuvers can abort some of these rapid heartbeat episodes. Metoprolol tartrate 50 mg twice daily should help with the symptoms. In spite of that, if symptoms are persistent, we could add another agent diltiazem 120 mg daily, it can substitute patient's current medication isosorbide mononitrate 30 mg daily, if patient would like to limit number of medications. I will see her for follow-up in 3 months.  Thanks MJP

## 2022-12-30 ENCOUNTER — Telehealth: Payer: Self-pay | Admitting: Cardiology

## 2022-12-30 NOTE — Telephone Encounter (Signed)
Zio patch monitor 14 days 12/02/2022 - 12/16/2022: Dominant rhythm: Sinus. HR 49-109 bpm. Avg HR 68 bpm, in sinus rhythm. 47 episodes of SVT, fastest at 171 bpm for 5 beats, longest for 25 min 17 sec at 147 bpm. <1% isolated SVE, couplet/triplets. 0 episodes of VT. 1.7% isolated VE, <1% couplet/triplets. No atrial fibrillation/atrial flutter/VT/high grade AV block, sinus pause >3sec noted. 3 patient triggered events, at least one occurred within +/- 45 seconds of SVT.    Went over the pts monitor results per Dr. Rosemary Holms, as indicated above.  She states she started her Diltiazem CD 120 mg daily and is still taking her metoprolol tartrate bid and is feeling great with no complaints of palpitations.  She just inquired a little more information about the monitor report since reading it on her mychart.  Pt education provided and all pts questions answered.   Pt verbalized understanding and agrees with this plan.

## 2022-12-30 NOTE — Telephone Encounter (Signed)
  Pt is calling back regarding her heart monitor result. She said, she only recorded 3 episode but when she checked it again she saw 47 episodes. She is very concern and would like to speak with the nurse again to discuss this and medication.

## 2023-03-18 NOTE — Progress Notes (Signed)
 [facility] - Clinic Note  03/30/2023     CHIEF COMPLAINT Patient presents for Retina Follow Up  HISTORY OF PRESENT ILLNESS: [name] is a 88 y.o. female who presents to the clinic today for:   HPI     Retina Follow Up   Patient presents with  Dry AMD.  In both eyes.  This started 6 months ago.  Duration of 6 months.  Since onset it is stable.  I, the attending physician,  performed the HPI with the patient and updated documentation appropriately.        Comments   6 month retina follow up ARMD OU pt is reporting no vision changes noticed she denies any flashes or floaters       Last edited by Valdemar Rogue, MD on 03/30/2023 12:51 PM.    Patient states she had a hard time reading the eye chart this morning, she uses AT's BID OU  Referring physician: Jesus Elberta Gainer, FNP No address on file  HISTORICAL INFORMATION:   Selected notes from the MEDICAL RECORD NUMBER Referred by Dr. Roz for eval of AMD OU LEE: 08.25.21 Ocular Hx- Pseudophakia OU PMH- DM    CURRENT MEDICATIONS: No current outpatient medications on file. (Ophthalmic Drugs)   No current facility-administered medications for this visit. (Ophthalmic Drugs)   Current Outpatient Medications (Other)  Medication Sig   diltiazem  (CARDIZEM  CD) 120 MG 24 hr capsule Take 1 capsule (120 mg total) by mouth daily.   hydrALAZINE  (APRESOLINE ) 100 MG tablet Take 1 tablet (100 mg total) by mouth 2 (two) times daily.   lisinopril (ZESTRIL) 10 MG tablet Take 10 mg by mouth daily.   metoprolol tartrate (LOPRESSOR) 50 MG tablet Take 50 mg by mouth 2 (two) times daily.   Multiple Vitamins-Minerals (MULTIVITAMIN WITH MINERALS) tablet Take 1 tablet by mouth daily.   MYRBETRIQ 50 MG TB24 tablet Take 50 mg by mouth daily.   pravastatin (PRAVACHOL) 40 MG tablet Take 40 mg by mouth at bedtime.   No current facility-administered medications for this visit. (Other)   REVIEW OF SYSTEMS: ROS    Positive for: Neurological, Musculoskeletal, Eyes Negative for: Constitutional, Gastrointestinal, Skin, Genitourinary, HENT, Endocrine, Cardiovascular, Respiratory, Psychiatric, Allergic/Imm, Heme/Lymph Last edited by Resa Delon ORN, COT on 03/30/2023  9:08 AM.       ALLERGIES Allergies  Allergen Reactions   Ciprofloxacin Itching and Rash   Lovastatin Itching and Rash   Sulfamethoxazole Itching and Rash   Sulfonamide Derivatives Itching and Rash   PAST MEDICAL HISTORY Past Medical History:  Diagnosis Date   Arthritis    Cancer (HCC)    basal cell on face   Complication of anesthesia    History of kidney stones    Hypertension    PONV (postoperative nausea and vomiting)    Past Surgical History:  Procedure Laterality Date   DEBRIDEMENT AND CLOSURE WOUND Left 04/21/2021   Procedure: DEBRIDEMENT AND CLOSURE OF LEFT LOWER EXTREMITY  WOUND;  Surgeon: Marene Sieving, MD;  Location: [facility] OR;  Service: Plastics;  Laterality: Left;  45 minutes   EYE SURGERY     JOINT REPLACEMENT     FAMILY HISTORY Family History  Problem Relation Age of Onset   Heart attack Mother    Alcoholism Mother    Alcoholism Father    Diabetes Father    Heart disease Half-Sister    Arthritis Half-Sister    Diabetes Daughter     SOCIAL HISTORY Social History  Tobacco Use   Smoking status: Never   Smokeless tobacco: Never  Vaping Use   Vaping status: Never Used  Substance Use Topics   Alcohol use: Never   Drug use: Never       OPHTHALMIC EXAM:  Base Eye Exam     Visual Acuity (Snellen - Linear)       Right Left   Dist cc 20/30 -2 20/30 -2   Dist ph cc NI NI    Correction: Glasses         Tonometry (Tonopen, 9:11 AM)       Right Left   Pressure 10 13         Pupils       Pupils Dark Light Shape React APD   Right PERRL 4 3 Round Brisk None   Left PERRL 4 3 Round Brisk None         Visual Fields       Left Right    Full Full         Extraocular  Movement       Right Left    Full, Ortho Full, Ortho         Neuro/Psych     Oriented x3: Yes   Mood/Affect: Normal         Dilation     Both eyes: 2.5% Phenylephrine  @ 9:11 AM           Slit Lamp and Fundus Exam     Slit Lamp Exam       Right Left   Lids/Lashes Dermatochalasis - upper lid Dermatochalasis - upper lid   Conjunctiva/Sclera White and quiet White and quiet   Cornea arcus, trace Punctate epithelial erosions, trace PEE, well healed cataract wound arcus, Well healed temporal cataract wound, mild tear film debris, trace PEE   Anterior Chamber Deep and clear Deep and clear   Iris Round and dilated Round and dilated, No NVI   Lens PC IOL in good position with open PC PC IOL in good position with open PC -- small opening   Anterior Vitreous mild syneresis Mild syneresis, Posterior vitreous detachment         Fundus Exam       Right Left   Disc Mild pallor, Sharp rim, +cupping, no heme mild Pallor, Sharp rim, mild PPA   C/D Ratio 0.65 0.5   Macula Flat, Blunted foveal reflex, Drusen, RPE mottling, clumping and early atrophy, No heme or edema Flat, Blunted foveal reflex, Drusen, RPE mottling and clumping, early atrophy, No heme or edema   Vessels attenuated, Tortuous attenuated, Tortuous   Periphery Attached, no heme, mild reticular degeneration Attached, mild reticular degeneration, No heme           Refraction     Wearing Rx       Sphere Cylinder Axis   Right -1.00 +0.50 085   Left -0.50 +1.00 105           IMAGING AND PROCEDURES  Imaging and Procedures for 03/30/2023  OCT, Retina - OU - Both Eyes       Right Eye Quality was good. Central Foveal Thickness: 243. Progression has been stable. Findings include normal foveal contour, no IRF, no SRF, retinal drusen .   Left Eye Quality was good. Central Foveal Thickness: 246. Progression has been stable. Findings include normal foveal contour, no IRF, no SRF, retinal drusen , outer retinal  atrophy (Trace central PED's, patchy ORA).   Notes *Images captured and  stored on drive  Diagnosis / Impression:  Non-exu ARMD OU  OS: Trace central PED's, patchy ORA  Clinical management:  See below  Abbreviations: NFP - Normal foveal profile. CME - cystoid macular edema. PED - pigment epithelial detachment. IRF - intraretinal fluid. SRF - subretinal fluid. EZ - ellipsoid zone. ERM - epiretinal membrane. ORA - outer retinal atrophy. ORT - outer retinal tubulation. SRHM - subretinal hyper-reflective material. IRHM - intraretinal hyper-reflective material             ASSESSMENT/PLAN:    ICD-10-CM   1. Intermediate stage nonexudative age-related macular degeneration of both eyes  H35.3132 OCT, Retina - OU - Both Eyes    2. Essential hypertension  I10     3. Hypertensive retinopathy of both eyes  H35.033     4. Pseudophakia of both eyes  Z96.1     5. Dry eyes, bilateral  H04.123      1. Age related macular degeneration, non-exudative, OU  - intermediate stage -- stable  - OCT shows Non-exu ARMD OU; OS: Trace central PED's, patchy ORA -- no fluid OU  - Continue amsler grid monitoring  - f/u 6-9 months-- OCT, DFE  2,3. Hypertensive retinopathy OU - discussed importance of tight BP control - continue to monitor  4. Pseudophakia OU  - s/p CE/IOL OU (Dr. Vinie Baller, [place] )  - IOLs in good position, doing well  - continue to monitor  5. Dry eyes OU - recommend artificial tears and lubricating ointment as needed  Ophthalmic Meds Ordered this visit:  No orders of the defined types were placed in this encounter.     Return for f/u 6-9 months, non-exu ARMD OU, DFE, OCT.  This document serves as a record of services personally performed by Redell JUDITHANN Hans, MD, PhD. It was created on their behalf by Alan PARAS. Delores, OA an ophthalmic technician. The creation of this record is the provider's dictation and/or activities during the visit.    Electronically signed by:  Alan PARAS. Delores, OA 03/30/23 12:53 PM  Redell JUDITHANN Hans, M.D., Ph.D. Diseases & Surgery of the Retina and Vitreous [facility]  I have reviewed the above documentation for accuracy and completeness, and I agree with the above. Redell JUDITHANN Hans, M.D., Ph.D. 03/30/23 12:53 PM   Abbreviations: M myopia (nearsighted); A astigmatism; H hyperopia (farsighted); P presbyopia; Mrx spectacle prescription;  CTL contact lenses; OD right eye; OS left eye; OU both eyes  XT exotropia; ET esotropia; PEK punctate epithelial keratitis; PEE punctate epithelial erosions; DES dry eye syndrome; MGD meibomian gland dysfunction; ATs artificial tears; PFAT's preservative free artificial tears; NSC nuclear sclerotic cataract; PSC posterior subcapsular cataract; ERM epi-retinal membrane; PVD posterior vitreous detachment; RD retinal detachment; DM diabetes mellitus; DR diabetic retinopathy; NPDR non-proliferative diabetic retinopathy; PDR proliferative diabetic retinopathy; CSME clinically significant macular edema; DME diabetic macular edema; dbh dot blot hemorrhages; CWS cotton wool spot; POAG primary open angle glaucoma; C/D cup-to-disc ratio; HVF humphrey visual field; GVF goldmann visual field; OCT optical coherence tomography; IOP intraocular pressure; BRVO Branch retinal vein occlusion; CRVO central retinal vein occlusion; CRAO central retinal artery occlusion; BRAO branch retinal artery occlusion; RT retinal tear; SB scleral buckle; PPV pars plana vitrectomy; VH Vitreous hemorrhage; PRP panretinal laser photocoagulation; IVK intravitreal kenalog; VMT vitreomacular traction; MH Macular hole;  NVD neovascularization of the disc; NVE neovascularization elsewhere; AREDS age related eye disease study; ARMD age related macular degeneration; POAG primary open angle glaucoma; EBMD epithelial/anterior basement  membrane dystrophy; ACIOL anterior chamber intraocular lens; IOL intraocular lens; PCIOL posterior  chamber intraocular lens; Phaco/IOL phacoemulsification with intraocular lens placement; PRK photorefractive keratectomy; LASIK laser assisted in situ keratomileusis; HTN hypertension; DM diabetes mellitus; COPD chronic obstructive pulmonary disease

## 2023-03-30 ENCOUNTER — Encounter (INDEPENDENT_AMBULATORY_CARE_PROVIDER_SITE_OTHER): Payer: Self-pay | Admitting: Ophthalmology

## 2023-03-30 ENCOUNTER — Ambulatory Visit (INDEPENDENT_AMBULATORY_CARE_PROVIDER_SITE_OTHER): Payer: Medicare Other | Admitting: Ophthalmology

## 2023-03-30 DIAGNOSIS — Z961 Presence of intraocular lens: Secondary | ICD-10-CM | POA: Diagnosis not present

## 2023-03-30 DIAGNOSIS — H04123 Dry eye syndrome of bilateral lacrimal glands: Secondary | ICD-10-CM

## 2023-03-30 DIAGNOSIS — H353132 Nonexudative age-related macular degeneration, bilateral, intermediate dry stage: Secondary | ICD-10-CM

## 2023-03-30 DIAGNOSIS — H35033 Hypertensive retinopathy, bilateral: Secondary | ICD-10-CM

## 2023-03-30 DIAGNOSIS — I10 Essential (primary) hypertension: Secondary | ICD-10-CM

## 2023-04-13 ENCOUNTER — Ambulatory Visit: Payer: Medicare HMO | Admitting: Cardiology

## 2023-05-17 ENCOUNTER — Encounter: Payer: Self-pay | Admitting: Cardiology

## 2023-05-17 ENCOUNTER — Ambulatory Visit: Payer: Self-pay | Attending: Cardiology | Admitting: Cardiology

## 2023-05-17 VITALS — BP 142/62 | HR 74 | Ht 60.0 in | Wt 141.2 lb

## 2023-05-17 DIAGNOSIS — I340 Nonrheumatic mitral (valve) insufficiency: Secondary | ICD-10-CM

## 2023-05-17 DIAGNOSIS — I272 Pulmonary hypertension, unspecified: Secondary | ICD-10-CM | POA: Diagnosis not present

## 2023-05-17 DIAGNOSIS — I471 Supraventricular tachycardia, unspecified: Secondary | ICD-10-CM

## 2023-05-17 NOTE — Patient Instructions (Signed)
 Testing/Procedures: Echo in 6 months   Your physician has requested that you have an echocardiogram. Echocardiography is a painless test that uses sound waves to create images of your heart. It provides your doctor with information about the size and shape of your heart and how well your heart's chambers and valves are working. This procedure takes approximately one hour. There are no restrictions for this procedure. Please do NOT wear cologne, perfume, aftershave, or lotions (deodorant is allowed). Please arrive 15 minutes prior to your appointment time.  Please note: We ask at that you not bring children with you during ultrasound (echo/ vascular) testing. Due to room size and safety concerns, children are not allowed in the ultrasound rooms during exams. Our front office staff cannot provide observation of children in our lobby area while testing is being conducted. An adult accompanying a patient to their appointment will only be allowed in the ultrasound room at the discretion of the ultrasound technician under special circumstances. We apologize for any inconvenience.    Follow-Up: At [facility], you and your health needs are our priority.  As part of our continuing mission to provide you with exceptional heart care, we have created designated Provider Care Teams.  These Care Teams include your primary Cardiologist (physician) and Advanced Practice Providers (APPs -  Physician Assistants and Nurse Practitioners) who all work together to provide you with the care you need, when you need it.   Your next appointment:   6 month(s)  Provider:   Elder Negus, MD     Other Instructions   1st Floor: - Lobby - Registration  - Pharmacy  - Lab - Cafe  2nd Floor: - PV Lab - Diagnostic Testing (echo, CT, nuclear med)  3rd Floor: - Vacant  4th Floor: - TCTS (cardiothoracic surgery) - AFib Clinic - Structural Heart Clinic - Vascular Surgery  - Vascular  Ultrasound  5th Floor: - [facility] (general and EP) - Clinical Pharmacy for coumadin, hypertension, lipid, weight-loss medications, and med management appointments    Valet parking services will be available as well.

## 2023-05-17 NOTE — Progress Notes (Signed)
 Cardiology Office Note:  .   Date:  05/17/2023  ID:  [name], DOB 1936/02/09, MRN [id number] PCP: Diane Mangle, FNP  [facility] Providers Cardiologist:  Truett Mainland, MD PCP: Diane Mangle, FNP  Chief Complaint  Patient presents with   SVT      History of Present Illness: .    [name] is a 88 y.o. female with hypertension, hyperlipidemia, SVT, mod MR, mild PH  Patient is doing well.  She is quite asymptomatic even with her SVT, MR, NPH.  She does not want to take Lasix as it causes her to have accidents with her bladder history.  She denies orthopnea, PND, leg edema symptoms.    Vitals:   05/17/23 1332  BP: (!) 142/62  Pulse: 74  SpO2: 98%     ROS:  Review of Systems  Cardiovascular:  Negative for chest pain, dyspnea on exertion, leg swelling, palpitations and syncope.     Studies Reviewed: Marland Kitchen        EKG 11/29/2022:  Sinus rhythm with occasional Premature ventricular complexes and Premature atrial complexes Septal infarct (cited on or before 15-Apr-2021) When compared with ECG of 15-Apr-2021 09:11, Premature ventricular complexes are now Present     Zio patch monitor 14 days 12/02/2022 - 12/16/2022: Dominant rhythm: Sinus. HR 49-109 bpm. Avg HR 68 bpm, in sinus rhythm. 47 episodes of SVT, fastest at 171 bpm for 5 beats, longest for 25 min 17 sec at 147 bpm. <1% isolated SVE, couplet/triplets. 0 episodes of VT. 1.7% isolated VE, <1% couplet/triplets. No atrial fibrillation/atrial flutter/VT/high grade AV block, sinus pause >3sec noted. 3 patient triggered events, at least one occurred within +/- 45 seconds of SVT.  Echocardiogram 12/16/2022:  1. Left ventricular ejection fraction, by estimation, is 55 to 60%. Left  ventricular ejection fraction by 3D volume is 57 %. The left ventricle has  normal function. The left ventricle has no regional wall motion  abnormalities. There is mild left  ventricular hypertrophy of  the basal-septal segment. Left ventricular  diastolic parameters were normal. The average left ventricular global  longitudinal strain is -19.3 %. The global longitudinal strain is normal.   2. Right ventricular systolic function is normal. The right ventricular  size is normal. There is moderately elevated pulmonary artery systolic  pressure. The estimated right ventricular systolic pressure is 55.1 mmHg.   3. The mitral valve is degenerative. Moderate mitral valve regurgitation.  No evidence of mitral stenosis. Moderate mitral annular calcification.   4. Tricuspid valve regurgitation is mild to moderate.   5. The aortic valve is tricuspid. Aortic valve regurgitation is mild.  Aortic valve sclerosis/calcification is present, without any evidence of  aortic stenosis.   6. Aortic dilatation noted. There is mild dilatation of the ascending  aorta, measuring 38 mm.   7. The inferior vena cava is normal in size with greater than 50%  respiratory variability, suggesting right atrial pressure of 3 mmHg.    Physical Exam:   Physical Exam Vitals and nursing note reviewed.  Constitutional:      General: She is not in acute distress. Neck:     Vascular: No JVD.  Cardiovascular:     Rate and Rhythm: Normal rate and regular rhythm.     Heart sounds: Murmur heard.     High-pitched blowing holosystolic murmur is present with a grade of 2/6 at the apex.  Pulmonary:     Effort: Pulmonary effort is normal.  Breath sounds: Normal breath sounds. No wheezing or rales.  Musculoskeletal:     Right lower leg: No edema.     Left lower leg: No edema.      VISIT DIAGNOSES:   ICD-10-CM   1. Pulmonary HTN (HCC)  I27.20     2. SVT (supraventricular tachycardia) (HCC)  I47.10     3. Nonrheumatic mitral valve regurgitation  I34.0         ASSESSMENT AND PLAN: .    [name] is a 88 y.o. female with hypertension, hyperlipidemia, SVT, mod MR, mild PH  Hypertension: Controlled.  No  changes made today.  SVT: Patient is completely asymptomatic.  I do not think she needs further workup or management other than continuing metoprolol and diltiazem at current doses.  Mitral regurgitation, mild pH: No associated symptoms.  Patient wants to avoid Lasix as far as possible.  In absence of leg edema, this is perfectly reasonable.    F/u in 6 months after repeat echocardiogram to ensure stability of mitral regurgitation and pH    Signed, Elder Negus, MD

## 2023-09-19 NOTE — Progress Notes (Shared)
 [facility] - Clinic Note  09/28/2023     CHIEF COMPLAINT Patient presents for No chief complaint on file.  HISTORY OF PRESENT ILLNESS: [name] is a 88 y.o. female who presents to the clinic today for:    Patient states   Referring physician: Jesus Elberta Gainer, FNP [id number] [place],  [place] [address]  HISTORICAL INFORMATION:   Selected notes from the MEDICAL RECORD NUMBER Referred by Dr. Roz for eval of AMD OU LEE: 08.25.21 Ocular Hx- Pseudophakia OU PMH- DM    CURRENT MEDICATIONS: No current outpatient medications on file. (Ophthalmic Drugs)   No current facility-administered medications for this visit. (Ophthalmic Drugs)   Current Outpatient Medications (Other)  Medication Sig   diltiazem  (CARDIZEM  CD) 120 MG 24 hr capsule Take 1 capsule (120 mg total) by mouth daily.   hydrALAZINE  (APRESOLINE ) 100 MG tablet Take 1 tablet (100 mg total) by mouth 2 (two) times daily.   lisinopril (ZESTRIL) 10 MG tablet Take 10 mg by mouth daily.   metoprolol tartrate (LOPRESSOR) 50 MG tablet Take 50 mg by mouth 2 (two) times daily.   Multiple Vitamins-Minerals (MULTIVITAMIN WITH MINERALS) tablet Take 1 tablet by mouth daily.   MYRBETRIQ 50 MG TB24 tablet Take 50 mg by mouth daily.   pravastatin (PRAVACHOL) 40 MG tablet Take 40 mg by mouth at bedtime.   No current facility-administered medications for this visit. (Other)   REVIEW OF SYSTEMS:     ALLERGIES Allergies  Allergen Reactions   Ciprofloxacin Itching and Rash   Lovastatin Itching and Rash   Sulfamethoxazole Itching and Rash   Sulfonamide Derivatives Itching and Rash   PAST MEDICAL HISTORY Past Medical History:  Diagnosis Date   Arthritis    Cancer (HCC)    basal cell on face   Complication of anesthesia    History of kidney stones    Hypertension    PONV (postoperative nausea and vomiting)    Past Surgical History:  Procedure Laterality Date   DEBRIDEMENT AND  CLOSURE WOUND Left 04/21/2021   Procedure: DEBRIDEMENT AND CLOSURE OF LEFT LOWER EXTREMITY  WOUND;  Surgeon: Marene Sieving, MD;  Location: [facility] OR;  Service: Plastics;  Laterality: Left;  45 minutes   EYE SURGERY     JOINT REPLACEMENT     FAMILY HISTORY Family History  Problem Relation Age of Onset   Heart attack Mother    Alcoholism Mother    Alcoholism Father    Diabetes Father    Heart disease Half-Sister    Arthritis Half-Sister    Diabetes Daughter     SOCIAL HISTORY Social History   Tobacco Use   Smoking status: Never   Smokeless tobacco: Never  Vaping Use   Vaping status: Never Used  Substance Use Topics   Alcohol use: Never   Drug use: Never       OPHTHALMIC EXAM:  Not recorded    IMAGING AND PROCEDURES  Imaging and Procedures for 09/28/2023           ASSESSMENT/PLAN:  No diagnosis found.  1. Age related macular degeneration, non-exudative, OU  - intermediate stage -- stable  - OCT shows Non-exu ARMD OU; OS: Trace central PED's, patchy ORA -- no fluid OU  - Continue amsler grid monitoring  - f/u 6-9 months-- OCT, DFE  2,3. Hypertensive retinopathy OU - discussed importance of tight BP control - continue to monitor  4. Pseudophakia OU  - s/p CE/IOL OU (Dr. Vinie  Langley, [place] )  - IOLs in good position, doing well  - continue to monitor  5. Dry eyes OU - recommend artificial tears and lubricating ointment as needed  Ophthalmic Meds Ordered this visit:  No orders of the defined types were placed in this encounter.     No follow-ups on file.  This document serves as a record of services personally performed by Redell JUDITHANN Hans, MD, PhD. It was created on their behalf by Almetta Pesa, an ophthalmic technician. The creation of this record is the provider's dictation and/or activities during the visit.    Electronically signed by: Almetta Pesa, OA, 09/19/23  2:34 PM   Redell JUDITHANN Hans, M.D., Ph.D. Diseases & Surgery of the  Retina and Vitreous [facility]   Abbreviations: M myopia (nearsighted); A astigmatism; H hyperopia (farsighted); P presbyopia; Mrx spectacle prescription;  CTL contact lenses; OD right eye; OS left eye; OU both eyes  XT exotropia; ET esotropia; PEK punctate epithelial keratitis; PEE punctate epithelial erosions; DES dry eye syndrome; MGD meibomian gland dysfunction; ATs artificial tears; PFAT's preservative free artificial tears; NSC nuclear sclerotic cataract; PSC posterior subcapsular cataract; ERM epi-retinal membrane; PVD posterior vitreous detachment; RD retinal detachment; DM diabetes mellitus; DR diabetic retinopathy; NPDR non-proliferative diabetic retinopathy; PDR proliferative diabetic retinopathy; CSME clinically significant macular edema; DME diabetic macular edema; dbh dot blot hemorrhages; CWS cotton wool spot; POAG primary open angle glaucoma; C/D cup-to-disc ratio; HVF humphrey visual field; GVF goldmann visual field; OCT optical coherence tomography; IOP intraocular pressure; BRVO Branch retinal vein occlusion; CRVO central retinal vein occlusion; CRAO central retinal artery occlusion; BRAO branch retinal artery occlusion; RT retinal tear; SB scleral buckle; PPV pars plana vitrectomy; VH Vitreous hemorrhage; PRP panretinal laser photocoagulation; IVK intravitreal kenalog; VMT vitreomacular traction; MH Macular hole;  NVD neovascularization of the disc; NVE neovascularization elsewhere; AREDS age related eye disease study; ARMD age related macular degeneration; POAG primary open angle glaucoma; EBMD epithelial/anterior basement membrane dystrophy; ACIOL anterior chamber intraocular lens; IOL intraocular lens; PCIOL posterior chamber intraocular lens; Phaco/IOL phacoemulsification with intraocular lens placement; PRK photorefractive keratectomy; LASIK laser assisted in situ keratomileusis; HTN hypertension; DM diabetes mellitus; COPD chronic obstructive pulmonary disease

## 2023-09-28 ENCOUNTER — Encounter (INDEPENDENT_AMBULATORY_CARE_PROVIDER_SITE_OTHER): Payer: Medicare Other | Admitting: Ophthalmology

## 2023-09-28 DIAGNOSIS — H35033 Hypertensive retinopathy, bilateral: Secondary | ICD-10-CM

## 2023-09-28 DIAGNOSIS — H04123 Dry eye syndrome of bilateral lacrimal glands: Secondary | ICD-10-CM

## 2023-09-28 DIAGNOSIS — H353132 Nonexudative age-related macular degeneration, bilateral, intermediate dry stage: Secondary | ICD-10-CM

## 2023-09-28 DIAGNOSIS — Z961 Presence of intraocular lens: Secondary | ICD-10-CM

## 2023-09-28 DIAGNOSIS — I10 Essential (primary) hypertension: Secondary | ICD-10-CM

## 2023-10-26 NOTE — Progress Notes (Signed)
 [facility] - Clinic Note  11/02/2023     CHIEF COMPLAINT Patient presents for Retina Follow Up  HISTORY OF PRESENT ILLNESS: [name] is a 88 y.o. female who presents to the clinic today for:   HPI     Retina Follow Up   Patient presents with  Dry AMD.  In both eyes.  This started 7 months ago.  Duration of 7 months.  Since onset it is stable.  I, the attending physician,  performed the HPI with the patient and updated documentation appropriately.        Comments   7 month retina follow up AMD pt is reporting no vision changes noticed she denies any flashes or floaters she has been having more dryness using refresh or blink few times per day she states they do not help much       Last edited by Valdemar Rogue, MD on 11/11/2023  3:17 AM.     Patient states the eyes are so dry feeling. She is using them BID-TID.   Referring physician: Jesus Elberta Gainer, FNP [id number],  [place] [address]  HISTORICAL INFORMATION:   Selected notes from the MEDICAL RECORD NUMBER Referred by Dr. Roz for eval of AMD OU LEE: 08.25.21 Ocular Hx- Pseudophakia OU PMH- DM    CURRENT MEDICATIONS: No current outpatient medications on file. (Ophthalmic Drugs)   No current facility-administered medications for this visit. (Ophthalmic Drugs)   Current Outpatient Medications (Other)  Medication Sig   diltiazem  (CARDIZEM  CD) 120 MG 24 hr capsule Take 1 capsule (120 mg total) by mouth daily.   hydrALAZINE  (APRESOLINE ) 100 MG tablet Take 1 tablet (100 mg total) by mouth 2 (two) times daily.   lisinopril (ZESTRIL) 10 MG tablet Take 10 mg by mouth daily.   metoprolol tartrate (LOPRESSOR) 50 MG tablet Take 50 mg by mouth 2 (two) times daily.   Multiple Vitamins-Minerals (MULTIVITAMIN WITH MINERALS) tablet Take 1 tablet by mouth daily.   MYRBETRIQ 50 MG TB24 tablet Take 50 mg by mouth daily.   pravastatin (PRAVACHOL) 40 MG tablet Take 40 mg by mouth at  bedtime.   No current facility-administered medications for this visit. (Other)   REVIEW OF SYSTEMS:     ALLERGIES Allergies  Allergen Reactions   Ciprofloxacin Itching and Rash   Lovastatin Itching and Rash   Sulfamethoxazole Itching and Rash   Sulfonamide Derivatives Itching and Rash   PAST MEDICAL HISTORY Past Medical History:  Diagnosis Date   Arthritis    Cancer (HCC)    basal cell on face   Complication of anesthesia    History of kidney stones    Hypertension    PONV (postoperative nausea and vomiting)    Past Surgical History:  Procedure Laterality Date   DEBRIDEMENT AND CLOSURE WOUND Left 04/21/2021   Procedure: DEBRIDEMENT AND CLOSURE OF LEFT LOWER EXTREMITY  WOUND;  Surgeon: Marene Sieving, MD;  Location: [facility] OR;  Service: Plastics;  Laterality: Left;  45 minutes   EYE SURGERY     JOINT REPLACEMENT     FAMILY HISTORY Family History  Problem Relation Age of Onset   Heart attack Mother    Alcoholism Mother    Alcoholism Father    Diabetes Father    Heart disease Half-Sister    Arthritis Half-Sister    Diabetes Daughter     SOCIAL HISTORY Social History   Tobacco Use   Smoking status: Never   Smokeless tobacco: Never  Vaping Use   Vaping status: Never Used  Substance Use Topics   Alcohol use: Never   Drug use: Never       OPHTHALMIC EXAM:  Base Eye Exam     Visual Acuity (Snellen - Linear)       Right Left   Dist cc 20/30 -1 20/30 -2   Dist ph cc 20/25 -2 NI         Tonometry (Tonopen, 9:21 AM)       Right Left   Pressure 10 12         Pupils       Pupils Dark Light Shape React APD   Right PERRL 4 3 Round Brisk None   Left PERRL 4 3 Round Brisk None         Visual Fields       Left Right    Full Full         Extraocular Movement       Right Left    Full, Ortho Full, Ortho         Neuro/Psych     Oriented x3: Yes   Mood/Affect: Normal         Dilation     Both eyes: 2.5% Phenylephrine  @ 9:21 AM            Slit Lamp and Fundus Exam     Slit Lamp Exam       Right Left   Lids/Lashes Dermatochalasis - upper lid Dermatochalasis - upper lid   Conjunctiva/Sclera White and quiet White and quiet   Cornea arcus, trace Punctate epithelial erosions, well healed cataract wound, Debris in tear film arcus, Well healed temporal cataract wound, mild tear film debris, trace PEE   Anterior Chamber Deep and clear Deep and clear   Iris Round and dilated Round and dilated, No NVI   Lens PC IOL in good position with open PC PC IOL in good position with open PC -- small opening   Anterior Vitreous mild syneresis Mild syneresis, Posterior vitreous detachment         Fundus Exam       Right Left   Disc Mild pallor, Sharp rim, +cupping, no heme mild Pallor, Sharp rim, mild PPA   C/D Ratio 0.7 0.5   Macula Flat, Blunted foveal reflex, Drusen, RPE mottling, clumping and early atrophy, No heme or edema Flat, Blunted foveal reflex, Drusen, RPE mottling and clumping, early atrophy, No heme or edema   Vessels attenuated, Tortuous attenuated, Tortuous   Periphery Attached, no heme, mild reticular degeneration Attached, mild reticular degeneration, No heme           Refraction     Wearing Rx       Sphere Cylinder Axis   Right -1.00 +0.50 085   Left -0.50 +1.00 105           IMAGING AND PROCEDURES  Imaging and Procedures for 11/02/2023  OCT, Retina - OU - Both Eyes       Right Eye Quality was good. Central Foveal Thickness: 243. Progression has been stable. Findings include normal foveal contour, no IRF, no SRF, retinal drusen , outer retinal atrophy (Mild patchy ORA).   Left Eye Quality was good. Central Foveal Thickness: 246. Progression has been stable. Findings include normal foveal contour, no IRF, no SRF, retinal drusen , outer retinal atrophy (Trace central PED's, patchy ORA).   Notes *Images captured and stored on drive  Diagnosis / Impression:  Non-exu ARMD  OU - mild  patchy ORA OS: Trace central PED's, patchy ORA  Clinical management:  See below  Abbreviations: NFP - Normal foveal profile. CME - cystoid macular edema. PED - pigment epithelial detachment. IRF - intraretinal fluid. SRF - subretinal fluid. EZ - ellipsoid zone. ERM - epiretinal membrane. ORA - outer retinal atrophy. ORT - outer retinal tubulation. SRHM - subretinal hyper-reflective material. IRHM - intraretinal hyper-reflective material            ASSESSMENT/PLAN:    ICD-10-CM   1. Intermediate stage nonexudative age-related macular degeneration of both eyes  H35.3132 OCT, Retina - OU - Both Eyes    2. Essential hypertension  I10     3. Hypertensive retinopathy of both eyes  H35.033     4. Pseudophakia of both eyes  Z96.1     5. Dry eyes, bilateral  H04.123       1. Age related macular degeneration, non-exudative, OU  - intermediate stage -- stable - OCT shows Non-exu ARMD OU; OS: Trace central PED's, patchy ORA -- no fluid OU  - Continue amsler grid monitoring  - f/u 6-9 months-- OCT, DFE  2,3. Hypertensive retinopathy OU - discussed importance of tight BP control - continue to monitor  4. Pseudophakia OU  - s/p CE/IOL OU (Dr. Vinie Baller, [place] )  - IOLs in good position, doing well  - continue to monitor  5. Dry eyes OU - recommend artificial tears and lubricating ointment as needed  Ophthalmic Meds Ordered this visit:  No orders of the defined types were placed in this encounter.     Return in about 9 months (around 08/01/2024) for f/u, Non Ex. AMD, DFE, OCT.  This document serves as a record of services personally performed by Redell JUDITHANN Hans, MD, PhD. It was created on their behalf by Almetta Pesa, an ophthalmic technician. The creation of this record is the provider's dictation and/or activities during the visit.    Electronically signed by: Almetta Pesa, OA, 11/11/23  3:18 AM  This document serves as a record of services personally  performed by Redell JUDITHANN Hans, MD, PhD. It was created on their behalf by Wanda GEANNIE Keens, COT an ophthalmic technician. The creation of this record is the provider's dictation and/or activities during the visit.    Electronically signed by:  Wanda GEANNIE Keens, COT  11/11/23 3:18 AM  Redell JUDITHANN Hans, M.D., Ph.D. Diseases & Surgery of the Retina and Vitreous [facility]  I have reviewed the above documentation for accuracy and completeness, and I agree with the above. Redell JUDITHANN Hans, M.D., Ph.D. 11/11/23 3:18 AM   Abbreviations: M myopia (nearsighted); A astigmatism; H hyperopia (farsighted); P presbyopia; Mrx spectacle prescription;  CTL contact lenses; OD right eye; OS left eye; OU both eyes  XT exotropia; ET esotropia; PEK punctate epithelial keratitis; PEE punctate epithelial erosions; DES dry eye syndrome; MGD meibomian gland dysfunction; ATs artificial tears; PFAT's preservative free artificial tears; NSC nuclear sclerotic cataract; PSC posterior subcapsular cataract; ERM epi-retinal membrane; PVD posterior vitreous detachment; RD retinal detachment; DM diabetes mellitus; DR diabetic retinopathy; NPDR non-proliferative diabetic retinopathy; PDR proliferative diabetic retinopathy; CSME clinically significant macular edema; DME diabetic macular edema; dbh dot blot hemorrhages; CWS cotton wool spot; POAG primary open angle glaucoma; C/D cup-to-disc ratio; HVF humphrey visual field; GVF goldmann visual field; OCT optical coherence tomography; IOP intraocular pressure; BRVO Branch retinal vein occlusion; CRVO central retinal vein occlusion; CRAO central retinal artery occlusion; BRAO branch retinal artery  occlusion; RT retinal tear; SB scleral buckle; PPV pars plana vitrectomy; VH Vitreous hemorrhage; PRP panretinal laser photocoagulation; IVK intravitreal kenalog; VMT vitreomacular traction; MH Macular hole;  NVD neovascularization of the disc; NVE neovascularization  elsewhere; AREDS age related eye disease study; ARMD age related macular degeneration; POAG primary open angle glaucoma; EBMD epithelial/anterior basement membrane dystrophy; ACIOL anterior chamber intraocular lens; IOL intraocular lens; PCIOL posterior chamber intraocular lens; Phaco/IOL phacoemulsification with intraocular lens placement; PRK photorefractive keratectomy; LASIK laser assisted in situ keratomileusis; HTN hypertension; DM diabetes mellitus; COPD chronic obstructive pulmonary disease

## 2023-10-31 ENCOUNTER — Ambulatory Visit (HOSPITAL_COMMUNITY)
Admission: RE | Admit: 2023-10-31 | Discharge: 2023-10-31 | Disposition: A | Discharge status: Home / Self Care | Source: Ambulatory Visit | Attending: Internal Medicine | Admitting: Internal Medicine

## 2023-10-31 DIAGNOSIS — I272 Pulmonary hypertension, unspecified: Secondary | ICD-10-CM | POA: Diagnosis present

## 2023-10-31 LAB — ECHOCARDIOGRAM COMPLETE
Area-P 1/2: 3.99 cm2
MV M vel: 5.8 m/s
MV Peak grad: 134.6 mmHg
Radius: 0.5 cm
S' Lateral: 2.8 cm

## 2023-10-31 NOTE — Progress Notes (Signed)
 Cardiology Office Note:  .   Date:  10/31/2023  ID:  [name], DOB [birth year], MRN [id number] PCP: Diane Elberta Gainer, FNP  [facility] Providers Cardiologist:  Diane Lawrence, MD PCP: Diane Elberta Gainer, FNP  Chief Complaint  Patient presents with   Hypertension      History of Present Illness: .    [name] is a 88 y.o. female with hypertension, hyperlipidemia, SVT, mod MR, mild PH  Patient has not had any palpitation symptoms.  Reviewed recent echocardiogram, details below.  Patient's blood pressure is generally high, but tends to have occasional episodes of systolic in 90s and 100s where she feels terrible.  On further questioning, she barely drinks 16 ounces of water every day with occasional skim milk and coffee.  Vitals:   11/11/23 0959  BP: 138/64  Pulse: 73  SpO2: 96%      ROS:  Review of Systems  Cardiovascular:  Negative for chest pain, dyspnea on exertion, leg swelling, palpitations and syncope.     Studies Reviewed: SABRA        EKG 11/11/2023: Sinus rhythm with occasional Premature ventricular complexes Minimal voltage criteria for LVH, may be normal variant ( R in aVL ) Possible Anterior infarct (cited on or before 15-Apr-2021) When compared with ECG of 29-Nov-2022 08:51, Premature atrial complexes are no longer Present   Zio patch monitor 14 days 12/02/2022 - 12/16/2022: Dominant rhythm: Sinus. HR 49-109 bpm. Avg HR 68 bpm, in sinus rhythm. 47 episodes of SVT, fastest at 171 bpm for 5 beats, longest for 25 min 17 sec at 147 bpm. <1% isolated SVE, couplet/triplets. 0 episodes of VT. 1.7% isolated VE, <1% couplet/triplets. No atrial fibrillation/atrial flutter/VT/high grade AV block, sinus pause >3sec noted. 3 patient triggered events, at least one occurred within +/- 45 seconds of SVT.  Echocardiogram 10/2023:  1. Left ventricular ejection fraction, by estimation, is 60 to 65%. The left ventricle has normal function.  The left ventricle has no regional wall motion abnormalities. Left ventricular diastolic function could not be evaluated. The average left  ventricular global longitudinal strain is -19.1 %. The global longitudinal strain is normal.  2. Right ventricular systolic function is normal. The right ventricular size is normal. There is mildly elevated pulmonary artery systolic pressure.  3. The mitral valve is normal in structure. Moderate mitral valve regurgitation. No evidence of mitral stenosis.  4. The aortic valve is tricuspid. Aortic valve regurgitation is trivial. Aortic valve sclerosis/calcification is present, without any evidence of aortic stenosis.  5. The inferior vena cava is normal in size with greater than 50% respiratory variability, suggesting right atrial pressure of 3 mmHg.  Physical Exam:   Physical Exam Vitals and nursing note reviewed.  Constitutional:      General: She is not in acute distress. Neck:     Vascular: No JVD.  Cardiovascular:     Rate and Rhythm: Normal rate and regular rhythm.     Heart sounds: Murmur heard.     High-pitched blowing holosystolic murmur is present with a grade of 2/6 at the apex.  Pulmonary:     Effort: Pulmonary effort is normal.     Breath sounds: Normal breath sounds. No wheezing or rales.  Musculoskeletal:     Right lower leg: No edema.     Left lower leg: No edema.      VISIT DIAGNOSES:   ICD-10-CM   1. SVT (supraventricular tachycardia) (HCC)  I47.10 EKG 12-Lead    2.  Primary hypertension  I10 EKG 12-Lead    Basic metabolic panel with GFR    3. Nonrheumatic mitral valve regurgitation  I34.0          ASSESSMENT AND PLAN: .    [name] is a 88 y.o. female with hypertension, hyperlipidemia, SVT, mod MR, mild PH  Hypertension: Generally elevated, but episodes of low blood pressure likely due to dehydration. Encouraged increased fluid intake, of at least 32 ounces of water, with caffeine and milk in addition. No  changes made to her blood pressure medications today. Recommend continue monitoring and follow-up in 3 months.  SVT: Patient is completely asymptomatic.  I do not think she needs further workup or management other than continuing metoprolol and diltiazem  at current doses.  Mitral regurgitation: Moderate MR. No associated symptoms.  No PH on recent echcoardiogram 10/2023.    F/u in 3 months w/APP   Signed, Diane JINNY Lawrence, MD

## 2023-11-02 ENCOUNTER — Ambulatory Visit (INDEPENDENT_AMBULATORY_CARE_PROVIDER_SITE_OTHER): Admitting: Ophthalmology

## 2023-11-02 ENCOUNTER — Encounter (INDEPENDENT_AMBULATORY_CARE_PROVIDER_SITE_OTHER): Payer: Self-pay | Admitting: Ophthalmology

## 2023-11-02 DIAGNOSIS — Z961 Presence of intraocular lens: Secondary | ICD-10-CM | POA: Diagnosis not present

## 2023-11-02 DIAGNOSIS — H35033 Hypertensive retinopathy, bilateral: Secondary | ICD-10-CM

## 2023-11-02 DIAGNOSIS — H353132 Nonexudative age-related macular degeneration, bilateral, intermediate dry stage: Secondary | ICD-10-CM | POA: Diagnosis not present

## 2023-11-02 DIAGNOSIS — I10 Essential (primary) hypertension: Secondary | ICD-10-CM

## 2023-11-02 DIAGNOSIS — H04123 Dry eye syndrome of bilateral lacrimal glands: Secondary | ICD-10-CM

## 2023-11-11 ENCOUNTER — Encounter: Payer: Self-pay | Admitting: Cardiology

## 2023-11-11 ENCOUNTER — Encounter (INDEPENDENT_AMBULATORY_CARE_PROVIDER_SITE_OTHER): Payer: Self-pay | Admitting: Ophthalmology

## 2023-11-11 ENCOUNTER — Ambulatory Visit: Attending: Cardiology | Admitting: Cardiology

## 2023-11-11 VITALS — BP 138/64 | HR 73 | Ht 60.0 in | Wt 143.4 lb

## 2023-11-11 DIAGNOSIS — I340 Nonrheumatic mitral (valve) insufficiency: Secondary | ICD-10-CM | POA: Diagnosis not present

## 2023-11-11 DIAGNOSIS — I471 Supraventricular tachycardia, unspecified: Secondary | ICD-10-CM | POA: Diagnosis not present

## 2023-11-11 DIAGNOSIS — I10 Essential (primary) hypertension: Secondary | ICD-10-CM | POA: Diagnosis not present

## 2023-11-11 LAB — BASIC METABOLIC PANEL WITH GFR
BUN/Creatinine Ratio: 26 (ref 12–28)
BUN: 25 mg/dL (ref 8–27)
CO2: 20 mmol/L (ref 20–29)
Calcium: 9.6 mg/dL (ref 8.7–10.3)
Chloride: 102 mmol/L (ref 96–106)
Creatinine, Ser: 0.97 mg/dL (ref 0.57–1.00)
Glucose: 117 mg/dL — ABNORMAL HIGH (ref 70–99)
Potassium: 4 mmol/L (ref 3.5–5.2)
Sodium: 139 mmol/L (ref 134–144)
eGFR: 57 mL/min/1.73 — ABNORMAL LOW (ref >59)

## 2023-11-11 NOTE — Patient Instructions (Addendum)
  Lab Work: [employer]  If you have labs (blood work) drawn today and your tests are completely normal, you will receive your results only by: MyChart Message (if you have MyChart) OR A paper copy in the mail If you have any lab test that is abnormal or we need to change your treatment, we will call you to review the results.  Follow-Up: At [facility], you and your health needs are our priority.  As part of our continuing mission to provide you with exceptional heart care, our providers are all part of one team.  This team includes your primary Cardiologist (physician) and Advanced Practice Providers or APPs (Physician Assistants and Nurse Practitioners) who all work together to provide you with the care you need, when you need it.  Your next appointment:   3 month(s)  Provider:   WITH APP

## 2023-11-13 ENCOUNTER — Ambulatory Visit: Payer: Self-pay | Admitting: Cardiology

## 2023-12-16 ENCOUNTER — Other Ambulatory Visit: Payer: Self-pay | Admitting: Cardiology

## 2024-01-11 ENCOUNTER — Encounter: Payer: Self-pay | Admitting: Physician Assistant

## 2024-01-11 ENCOUNTER — Ambulatory Visit: Attending: Physician Assistant | Admitting: Physician Assistant

## 2024-01-11 VITALS — BP 124/70 | Resp 12 | Ht 60.0 in | Wt 142.2 lb

## 2024-01-11 DIAGNOSIS — I10 Essential (primary) hypertension: Secondary | ICD-10-CM

## 2024-01-11 DIAGNOSIS — I340 Nonrheumatic mitral (valve) insufficiency: Secondary | ICD-10-CM | POA: Diagnosis not present

## 2024-01-11 DIAGNOSIS — E785 Hyperlipidemia, unspecified: Secondary | ICD-10-CM

## 2024-01-11 DIAGNOSIS — I272 Pulmonary hypertension, unspecified: Secondary | ICD-10-CM

## 2024-01-11 DIAGNOSIS — I471 Supraventricular tachycardia, unspecified: Secondary | ICD-10-CM

## 2024-01-11 NOTE — Progress Notes (Signed)
 Cardiology Office Note:    Date:  01/11/2024   ID:  [name], DOB 04/05/1935, MRN [id number]  PCP:  Jesus Elberta Gainer, FNP   [facility] Providers Cardiologist:  Newman JINNY Lawrence, MD Cardiology APP:  Madie Jon Garre, [place]     Referring MD: Jesus Elberta Gainer, *   Chief Complaint  Patient presents with   Follow-up    Labile BP    History of Present Illness:    [name] is a 88 y.o. female with a hx of HTN, HLD, SVT, moderate MR, mild PH. She has labile BP. Heart monitor 11/2022 demonstrated paroxysmal SVT (47 episodes in 14 days) longest for 25 minutes. Echo 10/2023 demonstrated preserved LVEF 60-65%, no RWMA, mildly elevated PASP, moderate MR, no aortic valve disease.   Given poor fluid intake, she was asked to increase her fluid intake to 32 ounces per day.   She presents for cardiology follow up. She is now drinking her yetti water bottle (18 oz) twice per day. Her BP looks beautiful today. She intermittently takes tylenol  for shakiness.   She has no complaints today, urology happy with her increased hydration.    Past Medical History:  Diagnosis Date   Arthritis    Cancer (HCC)    basal cell on face   Complication of anesthesia    History of kidney stones    Hypertension    PONV (postoperative nausea and vomiting)     Past Surgical History:  Procedure Laterality Date   DEBRIDEMENT AND CLOSURE WOUND Left 04/21/2021   Procedure: DEBRIDEMENT AND CLOSURE OF LEFT LOWER EXTREMITY  WOUND;  Surgeon: Marene Sieving, MD;  Location: [facility] OR;  Service: Plastics;  Laterality: Left;  45 minutes   EYE SURGERY     JOINT REPLACEMENT      Current Medications: Current Meds  Medication Sig   diltiazem  (CARDIZEM  CD) 120 MG 24 hr capsule TAKE 1 CAPSULE BY MOUTH DAILY   hydrALAZINE  (APRESOLINE ) 100 MG tablet Take 1 tablet (100 mg total) by mouth 2 (two) times daily.   lisinopril (ZESTRIL) 10 MG tablet Take 10 mg by mouth daily.   metoprolol  tartrate (LOPRESSOR) 50 MG tablet Take 50 mg by mouth 2 (two) times daily.   Multiple Vitamins-Minerals (EYE VITAMINS PO) Take 1 tablet by mouth daily.   Multiple Vitamins-Minerals (MULTIVITAMIN WITH MINERALS) tablet Take 1 tablet by mouth daily.   MYRBETRIQ 50 MG TB24 tablet Take 50 mg by mouth daily.   pravastatin (PRAVACHOL) 40 MG tablet Take 40 mg by mouth at bedtime.     Allergies:   Ciprofloxacin, Lovastatin, Sulfamethoxazole, and Sulfonamide derivatives   Social History   Socioeconomic History   Marital status: Widowed    Spouse name: Not on file   Number of children: 4   Years of education: Not on file   Highest education level: Not on file  Occupational History   Not on file  Tobacco Use   Smoking status: Never   Smokeless tobacco: Never  Vaping Use   Vaping status: Never Used  Substance and Sexual Activity   Alcohol use: Never   Drug use: Never   Sexual activity: Not on file  Other Topics Concern   Not on file  Social History Narrative   Not on file   Social Drivers of Health   Financial Resource Strain: Not on file  Food Insecurity: Low Risk  (11/22/2023)   Received from [facility]   Hunger Vital Sign  Within the past 12 months, you worried that your food would run out before you got money to buy more: Never true    Within the past 12 months, the food you bought just didn't last and you didn't have money to get more. : Never true  Transportation Needs: No Transportation Needs (11/22/2023)   Received from [facility]    In the past 12 months, has lack of reliable transportation kept you from medical appointments, meetings, work or from getting things needed for daily living? : No  Physical Activity: Not on file  Stress: Not on file  Social Connections: Not on file     Family History: The patient's family history includes Alcoholism in her father and mother; Arthritis in her half-sister; Diabetes in her daughter and father; Heart  attack in her mother; Heart disease in her half-sister.  ROS:   Please see the history of present illness.     All other systems reviewed and are negative.  EKGs/Labs/Other Studies Reviewed:    The following studies were reviewed today:       Recent Labs: 11/11/2023: BUN 25; Creatinine, Ser 0.97; Potassium 4.0; Sodium 139  Recent Lipid Panel    Component Value Date/Time   CHOL 216 (H) 12/22/2009 0840   TRIG 113.0 12/22/2009 0840   HDL 50.90 12/22/2009 0840   CHOLHDL 4 12/22/2009 0840   VLDL 22.6 12/22/2009 0840   LDLCALC 120 (H) 08/15/2009 0949   LDLDIRECT 146.0 12/22/2009 0840     Risk Assessment/Calculations:                Physical Exam:    VS:  BP 124/70 (BP Location: Right Arm, Patient Position: Sitting)   Resp 12   Ht 5' (1.524 m)   Wt 142 lb 3.2 oz (64.5 kg)   BMI 27.77 kg/m     Wt Readings from Last 3 Encounters:  01/11/24 142 lb 3.2 oz (64.5 kg)  11/11/23 143 lb 6.4 oz (65 kg)  05/17/23 141 lb 3.2 oz (64 kg)     GEN:  Well nourished, well developed in no acute distress HEENT: Normal NECK: No JVD; No carotid bruits LYMPHATICS: No lymphadenopathy CARDIAC: RRR, no murmurs, rubs, gallops RESPIRATORY:  Clear to auscultation without rales, wheezing or rhonchi  ABDOMEN: Soft, non-tender, non-distended MUSCULOSKELETAL:  No edema; No deformity  SKIN: Warm and dry NEUROLOGIC:  Alert and oriented x 3 PSYCHIATRIC:  Normal affect   ASSESSMENT:    1. Primary hypertension   2. SVT (supraventricular tachycardia)   3. Pulmonary HTN (HCC)   4. Nonrheumatic mitral valve regurgitation   5. Hyperlipidemia with target LDL less than 70    PLAN:    In order of problems listed above:  Hypertension Labile BP - working on improving PO fluid intake - maintained on 10 mg lisinopril, 50 mg lopressor BID, 100 mg hydralazine  BID, and 120 mg cardizem  daily - no medication changes today   Paroxysmal SVT - generally asymptomatic - continue lopressor and cardizem   as above   Moderate MR Mildly elevated PASP on recent echo - no SOB, appears euvolemic   Hyperlipidemia with LDL goal < 70 - continue 40 mg pravastatin   Overall doing very well. No changes. Follow up with me or Dr. Elmira in 6 months.            Medication Adjustments/Labs and Tests Ordered: Current medicines are reviewed at length with the patient today.  Concerns regarding medicines are outlined above.  No orders of the defined types were placed in this encounter.  No orders of the defined types were placed in this encounter.   Patient Instructions  Needs a 6 mo follow-up with Angie Constantin Hillery or Dr. Elmira   Signed, Jon Garre Marne, [place]  01/11/2024 10:39 AM    [facility]

## 2024-01-11 NOTE — Patient Instructions (Signed)
 Needs a 6 mo follow-up with Angie Duke or Dr. Elmira

## 2024-08-01 ENCOUNTER — Encounter (INDEPENDENT_AMBULATORY_CARE_PROVIDER_SITE_OTHER): Admitting: Ophthalmology
